# Patient Record
Sex: Female | Born: 1935 | Race: White | Hispanic: No | Smoking: Never smoker
Health system: Southern US, Community
[De-identification: ages and names within clinical notes are randomized; demographics above are authoritative.]

## PROBLEM LIST (undated history)

## (undated) DIAGNOSIS — E785 Hyperlipidemia, unspecified: Secondary | ICD-10-CM

## (undated) DIAGNOSIS — Z951 Presence of aortocoronary bypass graft: Secondary | ICD-10-CM

## (undated) DIAGNOSIS — E039 Hypothyroidism, unspecified: Secondary | ICD-10-CM

## (undated) DIAGNOSIS — N8110 Cystocele, unspecified: Secondary | ICD-10-CM

## (undated) DIAGNOSIS — I447 Left bundle-branch block, unspecified: Secondary | ICD-10-CM

## (undated) DIAGNOSIS — E079 Disorder of thyroid, unspecified: Secondary | ICD-10-CM

## (undated) DIAGNOSIS — I2510 Atherosclerotic heart disease of native coronary artery without angina pectoris: Secondary | ICD-10-CM

## (undated) DIAGNOSIS — J302 Other seasonal allergic rhinitis: Secondary | ICD-10-CM

## (undated) DIAGNOSIS — J4540 Moderate persistent asthma, uncomplicated: Secondary | ICD-10-CM

## (undated) DIAGNOSIS — C801 Malignant (primary) neoplasm, unspecified: Secondary | ICD-10-CM

## (undated) DIAGNOSIS — K219 Gastro-esophageal reflux disease without esophagitis: Secondary | ICD-10-CM

## (undated) DIAGNOSIS — I10 Essential (primary) hypertension: Secondary | ICD-10-CM

## (undated) DIAGNOSIS — M79671 Pain in right foot: Secondary | ICD-10-CM

## (undated) HISTORY — PX: THYROIDECTOMY: SHX17

## (undated) HISTORY — DX: Cystocele, unspecified: N81.10

## (undated) HISTORY — PX: JOINT REPLACEMENT: SHX530

## (undated) HISTORY — DX: Hyperlipidemia, unspecified: E78.5

## (undated) HISTORY — DX: Essential (primary) hypertension: I10

## (undated) HISTORY — DX: Other seasonal allergic rhinitis: J30.2

## (undated) HISTORY — DX: Pain in right foot: M79.671

## (undated) HISTORY — PX: ROTATOR CUFF REPAIR: SHX139

## (undated) HISTORY — PX: VAGINAL PROLAPSE REPAIR: SHX830

## (undated) HISTORY — DX: Left bundle-branch block, unspecified: I44.7

## (undated) HISTORY — DX: Moderate persistent asthma, uncomplicated: J45.40

## (undated) HISTORY — DX: Presence of aortocoronary bypass graft: Z95.1

## (undated) HISTORY — PX: KNEE ARTHROSCOPY: SUR90

## (undated) HISTORY — PX: THYROID SURGERY: SHX805

## (undated) HISTORY — PX: OTHER SURGICAL HISTORY: SHX169

## (undated) HISTORY — PX: APPENDECTOMY: SHX54

## (undated) HISTORY — DX: Atherosclerotic heart disease of native coronary artery without angina pectoris: I25.10

## (undated) HISTORY — DX: Disorder of thyroid, unspecified: E07.9

---

## 1997-02-24 HISTORY — PX: EYE SURGERY: SHX253

## 1997-06-07 ENCOUNTER — Other Ambulatory Visit: Admission: RE | Admit: 1997-06-07 | Discharge: 1997-06-07 | Payer: Self-pay | Admitting: Gynecology

## 1997-07-10 ENCOUNTER — Ambulatory Visit (HOSPITAL_COMMUNITY): Admission: RE | Admit: 1997-07-10 | Discharge: 1997-07-10 | Payer: Self-pay | Admitting: Gynecology

## 1998-06-12 ENCOUNTER — Other Ambulatory Visit: Admission: RE | Admit: 1998-06-12 | Discharge: 1998-06-12 | Payer: Self-pay | Admitting: Gynecology

## 1999-04-24 ENCOUNTER — Encounter: Payer: Self-pay | Admitting: Urology

## 1999-04-26 ENCOUNTER — Observation Stay (HOSPITAL_COMMUNITY): Admission: RE | Admit: 1999-04-26 | Discharge: 1999-04-27 | Payer: Self-pay | Admitting: Urology

## 1999-08-21 ENCOUNTER — Ambulatory Visit (HOSPITAL_COMMUNITY): Admission: RE | Admit: 1999-08-21 | Discharge: 1999-08-21 | Payer: Self-pay | Admitting: Specialist

## 1999-09-16 ENCOUNTER — Other Ambulatory Visit: Admission: RE | Admit: 1999-09-16 | Discharge: 1999-09-16 | Payer: Self-pay | Admitting: Gynecology

## 1999-09-26 ENCOUNTER — Ambulatory Visit (HOSPITAL_COMMUNITY): Admission: RE | Admit: 1999-09-26 | Discharge: 1999-09-26 | Payer: Self-pay | Admitting: Urology

## 1999-10-16 ENCOUNTER — Ambulatory Visit (HOSPITAL_COMMUNITY): Admission: RE | Admit: 1999-10-16 | Discharge: 1999-10-16 | Payer: Self-pay | Admitting: Specialist

## 2000-01-03 ENCOUNTER — Ambulatory Visit (HOSPITAL_COMMUNITY): Admission: RE | Admit: 2000-01-03 | Discharge: 2000-01-03 | Payer: Self-pay | Admitting: Urology

## 2000-09-28 ENCOUNTER — Other Ambulatory Visit: Admission: RE | Admit: 2000-09-28 | Discharge: 2000-09-28 | Payer: Self-pay | Admitting: Gynecology

## 2001-10-07 ENCOUNTER — Other Ambulatory Visit: Admission: RE | Admit: 2001-10-07 | Discharge: 2001-10-07 | Payer: Self-pay | Admitting: Obstetrics & Gynecology

## 2002-01-31 ENCOUNTER — Encounter: Admission: RE | Admit: 2002-01-31 | Discharge: 2002-01-31 | Payer: Self-pay | Admitting: Unknown Physician Specialty

## 2002-01-31 ENCOUNTER — Encounter: Payer: Self-pay | Admitting: Unknown Physician Specialty

## 2002-10-13 ENCOUNTER — Other Ambulatory Visit: Admission: RE | Admit: 2002-10-13 | Discharge: 2002-10-13 | Payer: Self-pay | Admitting: Obstetrics & Gynecology

## 2003-06-19 ENCOUNTER — Ambulatory Visit (HOSPITAL_COMMUNITY): Admission: RE | Admit: 2003-06-19 | Discharge: 2003-06-19 | Payer: Self-pay | Admitting: Obstetrics & Gynecology

## 2004-10-21 ENCOUNTER — Other Ambulatory Visit: Admission: RE | Admit: 2004-10-21 | Discharge: 2004-10-21 | Payer: Self-pay | Admitting: Obstetrics & Gynecology

## 2007-02-25 HISTORY — PX: OTHER SURGICAL HISTORY: SHX169

## 2011-02-28 DIAGNOSIS — M47817 Spondylosis without myelopathy or radiculopathy, lumbosacral region: Secondary | ICD-10-CM | POA: Diagnosis not present

## 2011-02-28 DIAGNOSIS — M5137 Other intervertebral disc degeneration, lumbosacral region: Secondary | ICD-10-CM | POA: Diagnosis not present

## 2011-03-31 DIAGNOSIS — Z124 Encounter for screening for malignant neoplasm of cervix: Secondary | ICD-10-CM | POA: Diagnosis not present

## 2011-03-31 DIAGNOSIS — Z1231 Encounter for screening mammogram for malignant neoplasm of breast: Secondary | ICD-10-CM | POA: Diagnosis not present

## 2011-04-02 DIAGNOSIS — K5730 Diverticulosis of large intestine without perforation or abscess without bleeding: Secondary | ICD-10-CM | POA: Diagnosis not present

## 2011-04-22 DIAGNOSIS — Z860100 Personal history of colon polyps, unspecified: Secondary | ICD-10-CM | POA: Diagnosis not present

## 2011-04-22 DIAGNOSIS — M129 Arthropathy, unspecified: Secondary | ICD-10-CM | POA: Diagnosis not present

## 2011-04-22 DIAGNOSIS — E7800 Pure hypercholesterolemia, unspecified: Secondary | ICD-10-CM | POA: Diagnosis not present

## 2011-04-22 DIAGNOSIS — Z79899 Other long term (current) drug therapy: Secondary | ICD-10-CM | POA: Diagnosis not present

## 2011-04-22 DIAGNOSIS — K219 Gastro-esophageal reflux disease without esophagitis: Secondary | ICD-10-CM | POA: Diagnosis not present

## 2011-04-22 DIAGNOSIS — K5730 Diverticulosis of large intestine without perforation or abscess without bleeding: Secondary | ICD-10-CM | POA: Diagnosis not present

## 2011-04-22 DIAGNOSIS — I10 Essential (primary) hypertension: Secondary | ICD-10-CM | POA: Diagnosis not present

## 2011-04-22 DIAGNOSIS — E079 Disorder of thyroid, unspecified: Secondary | ICD-10-CM | POA: Diagnosis not present

## 2011-04-22 DIAGNOSIS — Z7982 Long term (current) use of aspirin: Secondary | ICD-10-CM | POA: Diagnosis not present

## 2011-04-22 DIAGNOSIS — Z1211 Encounter for screening for malignant neoplasm of colon: Secondary | ICD-10-CM | POA: Diagnosis not present

## 2011-05-19 DIAGNOSIS — Z6827 Body mass index (BMI) 27.0-27.9, adult: Secondary | ICD-10-CM | POA: Diagnosis not present

## 2011-05-19 DIAGNOSIS — E785 Hyperlipidemia, unspecified: Secondary | ICD-10-CM | POA: Diagnosis not present

## 2011-05-19 DIAGNOSIS — D509 Iron deficiency anemia, unspecified: Secondary | ICD-10-CM | POA: Diagnosis not present

## 2011-05-19 DIAGNOSIS — Z79899 Other long term (current) drug therapy: Secondary | ICD-10-CM | POA: Diagnosis not present

## 2011-05-19 DIAGNOSIS — E039 Hypothyroidism, unspecified: Secondary | ICD-10-CM | POA: Diagnosis not present

## 2011-05-19 DIAGNOSIS — I10 Essential (primary) hypertension: Secondary | ICD-10-CM | POA: Diagnosis not present

## 2011-06-13 DIAGNOSIS — M5126 Other intervertebral disc displacement, lumbar region: Secondary | ICD-10-CM | POA: Diagnosis not present

## 2011-06-13 DIAGNOSIS — M5137 Other intervertebral disc degeneration, lumbosacral region: Secondary | ICD-10-CM | POA: Diagnosis not present

## 2011-06-13 DIAGNOSIS — Z6827 Body mass index (BMI) 27.0-27.9, adult: Secondary | ICD-10-CM | POA: Diagnosis not present

## 2011-06-13 DIAGNOSIS — I10 Essential (primary) hypertension: Secondary | ICD-10-CM | POA: Diagnosis not present

## 2011-06-13 DIAGNOSIS — M47817 Spondylosis without myelopathy or radiculopathy, lumbosacral region: Secondary | ICD-10-CM | POA: Diagnosis not present

## 2011-07-09 DIAGNOSIS — L301 Dyshidrosis [pompholyx]: Secondary | ICD-10-CM | POA: Diagnosis not present

## 2011-07-09 DIAGNOSIS — L981 Factitial dermatitis: Secondary | ICD-10-CM | POA: Diagnosis not present

## 2011-08-17 DIAGNOSIS — N3000 Acute cystitis without hematuria: Secondary | ICD-10-CM | POA: Diagnosis not present

## 2011-08-17 DIAGNOSIS — N390 Urinary tract infection, site not specified: Secondary | ICD-10-CM | POA: Diagnosis not present

## 2011-09-04 DIAGNOSIS — R252 Cramp and spasm: Secondary | ICD-10-CM | POA: Diagnosis not present

## 2011-09-04 DIAGNOSIS — E039 Hypothyroidism, unspecified: Secondary | ICD-10-CM | POA: Diagnosis not present

## 2011-09-04 DIAGNOSIS — Z6827 Body mass index (BMI) 27.0-27.9, adult: Secondary | ICD-10-CM | POA: Diagnosis not present

## 2011-09-04 DIAGNOSIS — E785 Hyperlipidemia, unspecified: Secondary | ICD-10-CM | POA: Diagnosis not present

## 2011-09-04 DIAGNOSIS — I10 Essential (primary) hypertension: Secondary | ICD-10-CM | POA: Diagnosis not present

## 2011-09-11 DIAGNOSIS — E871 Hypo-osmolality and hyponatremia: Secondary | ICD-10-CM | POA: Diagnosis not present

## 2011-09-18 DIAGNOSIS — H04129 Dry eye syndrome of unspecified lacrimal gland: Secondary | ICD-10-CM | POA: Diagnosis not present

## 2011-10-23 DIAGNOSIS — B079 Viral wart, unspecified: Secondary | ICD-10-CM | POA: Diagnosis not present

## 2011-12-22 DIAGNOSIS — E039 Hypothyroidism, unspecified: Secondary | ICD-10-CM | POA: Diagnosis not present

## 2011-12-22 DIAGNOSIS — R059 Cough, unspecified: Secondary | ICD-10-CM | POA: Diagnosis not present

## 2011-12-22 DIAGNOSIS — E785 Hyperlipidemia, unspecified: Secondary | ICD-10-CM | POA: Diagnosis not present

## 2011-12-22 DIAGNOSIS — Z23 Encounter for immunization: Secondary | ICD-10-CM | POA: Diagnosis not present

## 2011-12-22 DIAGNOSIS — E871 Hypo-osmolality and hyponatremia: Secondary | ICD-10-CM | POA: Diagnosis not present

## 2011-12-22 DIAGNOSIS — I10 Essential (primary) hypertension: Secondary | ICD-10-CM | POA: Diagnosis not present

## 2011-12-22 DIAGNOSIS — R05 Cough: Secondary | ICD-10-CM | POA: Diagnosis not present

## 2011-12-24 DIAGNOSIS — E871 Hypo-osmolality and hyponatremia: Secondary | ICD-10-CM | POA: Diagnosis not present

## 2011-12-31 DIAGNOSIS — N951 Menopausal and female climacteric states: Secondary | ICD-10-CM | POA: Diagnosis not present

## 2011-12-31 DIAGNOSIS — M818 Other osteoporosis without current pathological fracture: Secondary | ICD-10-CM | POA: Diagnosis not present

## 2012-01-15 DIAGNOSIS — M19079 Primary osteoarthritis, unspecified ankle and foot: Secondary | ICD-10-CM | POA: Diagnosis not present

## 2012-01-15 DIAGNOSIS — M779 Enthesopathy, unspecified: Secondary | ICD-10-CM | POA: Diagnosis not present

## 2012-01-15 DIAGNOSIS — M79609 Pain in unspecified limb: Secondary | ICD-10-CM | POA: Diagnosis not present

## 2012-04-05 DIAGNOSIS — N951 Menopausal and female climacteric states: Secondary | ICD-10-CM | POA: Diagnosis not present

## 2012-04-05 DIAGNOSIS — Z1231 Encounter for screening mammogram for malignant neoplasm of breast: Secondary | ICD-10-CM | POA: Diagnosis not present

## 2012-04-15 DIAGNOSIS — E785 Hyperlipidemia, unspecified: Secondary | ICD-10-CM | POA: Diagnosis not present

## 2012-04-15 DIAGNOSIS — E039 Hypothyroidism, unspecified: Secondary | ICD-10-CM | POA: Diagnosis not present

## 2012-04-15 DIAGNOSIS — I10 Essential (primary) hypertension: Secondary | ICD-10-CM | POA: Diagnosis not present

## 2012-04-15 DIAGNOSIS — Z6826 Body mass index (BMI) 26.0-26.9, adult: Secondary | ICD-10-CM | POA: Diagnosis not present

## 2012-04-16 DIAGNOSIS — M25519 Pain in unspecified shoulder: Secondary | ICD-10-CM | POA: Diagnosis not present

## 2012-04-19 DIAGNOSIS — M25519 Pain in unspecified shoulder: Secondary | ICD-10-CM | POA: Diagnosis not present

## 2012-04-21 DIAGNOSIS — M25519 Pain in unspecified shoulder: Secondary | ICD-10-CM | POA: Diagnosis not present

## 2012-04-23 DIAGNOSIS — M25519 Pain in unspecified shoulder: Secondary | ICD-10-CM | POA: Diagnosis not present

## 2012-05-03 DIAGNOSIS — M25519 Pain in unspecified shoulder: Secondary | ICD-10-CM | POA: Diagnosis not present

## 2012-05-05 DIAGNOSIS — M25519 Pain in unspecified shoulder: Secondary | ICD-10-CM | POA: Diagnosis not present

## 2012-05-07 DIAGNOSIS — M25519 Pain in unspecified shoulder: Secondary | ICD-10-CM | POA: Diagnosis not present

## 2012-05-10 DIAGNOSIS — M25519 Pain in unspecified shoulder: Secondary | ICD-10-CM | POA: Diagnosis not present

## 2012-05-12 DIAGNOSIS — M25519 Pain in unspecified shoulder: Secondary | ICD-10-CM | POA: Diagnosis not present

## 2012-05-13 DIAGNOSIS — J209 Acute bronchitis, unspecified: Secondary | ICD-10-CM | POA: Diagnosis not present

## 2012-05-13 DIAGNOSIS — J309 Allergic rhinitis, unspecified: Secondary | ICD-10-CM | POA: Diagnosis not present

## 2012-05-13 DIAGNOSIS — Z6826 Body mass index (BMI) 26.0-26.9, adult: Secondary | ICD-10-CM | POA: Diagnosis not present

## 2012-05-14 DIAGNOSIS — M25519 Pain in unspecified shoulder: Secondary | ICD-10-CM | POA: Diagnosis not present

## 2012-05-17 DIAGNOSIS — M25519 Pain in unspecified shoulder: Secondary | ICD-10-CM | POA: Diagnosis not present

## 2012-05-19 DIAGNOSIS — M25519 Pain in unspecified shoulder: Secondary | ICD-10-CM | POA: Diagnosis not present

## 2012-05-21 DIAGNOSIS — M25519 Pain in unspecified shoulder: Secondary | ICD-10-CM | POA: Diagnosis not present

## 2012-05-25 DIAGNOSIS — M25519 Pain in unspecified shoulder: Secondary | ICD-10-CM | POA: Diagnosis not present

## 2012-06-01 DIAGNOSIS — M25519 Pain in unspecified shoulder: Secondary | ICD-10-CM | POA: Diagnosis not present

## 2012-06-17 DIAGNOSIS — J209 Acute bronchitis, unspecified: Secondary | ICD-10-CM | POA: Diagnosis not present

## 2012-06-17 DIAGNOSIS — Z6827 Body mass index (BMI) 27.0-27.9, adult: Secondary | ICD-10-CM | POA: Diagnosis not present

## 2012-06-21 DIAGNOSIS — L259 Unspecified contact dermatitis, unspecified cause: Secondary | ICD-10-CM | POA: Diagnosis not present

## 2012-06-21 DIAGNOSIS — L820 Inflamed seborrheic keratosis: Secondary | ICD-10-CM | POA: Diagnosis not present

## 2012-06-24 DIAGNOSIS — Z6826 Body mass index (BMI) 26.0-26.9, adult: Secondary | ICD-10-CM | POA: Diagnosis not present

## 2012-06-24 DIAGNOSIS — J40 Bronchitis, not specified as acute or chronic: Secondary | ICD-10-CM | POA: Diagnosis not present

## 2012-06-24 DIAGNOSIS — J209 Acute bronchitis, unspecified: Secondary | ICD-10-CM | POA: Diagnosis not present

## 2012-08-02 DIAGNOSIS — N390 Urinary tract infection, site not specified: Secondary | ICD-10-CM | POA: Diagnosis not present

## 2012-08-02 DIAGNOSIS — Z1331 Encounter for screening for depression: Secondary | ICD-10-CM | POA: Diagnosis not present

## 2012-08-02 DIAGNOSIS — I10 Essential (primary) hypertension: Secondary | ICD-10-CM | POA: Diagnosis not present

## 2012-08-02 DIAGNOSIS — E039 Hypothyroidism, unspecified: Secondary | ICD-10-CM | POA: Diagnosis not present

## 2012-08-02 DIAGNOSIS — E871 Hypo-osmolality and hyponatremia: Secondary | ICD-10-CM | POA: Diagnosis not present

## 2012-08-02 DIAGNOSIS — E785 Hyperlipidemia, unspecified: Secondary | ICD-10-CM | POA: Diagnosis not present

## 2012-08-02 DIAGNOSIS — Z9181 History of falling: Secondary | ICD-10-CM | POA: Diagnosis not present

## 2012-09-28 DIAGNOSIS — R29818 Other symptoms and signs involving the nervous system: Secondary | ICD-10-CM | POA: Diagnosis not present

## 2012-09-28 DIAGNOSIS — R05 Cough: Secondary | ICD-10-CM | POA: Diagnosis not present

## 2012-09-28 DIAGNOSIS — K5732 Diverticulitis of large intestine without perforation or abscess without bleeding: Secondary | ICD-10-CM | POA: Diagnosis not present

## 2012-09-28 DIAGNOSIS — Z6827 Body mass index (BMI) 27.0-27.9, adult: Secondary | ICD-10-CM | POA: Diagnosis not present

## 2012-09-30 DIAGNOSIS — IMO0001 Reserved for inherently not codable concepts without codable children: Secondary | ICD-10-CM | POA: Diagnosis not present

## 2012-09-30 DIAGNOSIS — R279 Unspecified lack of coordination: Secondary | ICD-10-CM | POA: Diagnosis not present

## 2012-09-30 DIAGNOSIS — R29818 Other symptoms and signs involving the nervous system: Secondary | ICD-10-CM | POA: Diagnosis not present

## 2012-09-30 DIAGNOSIS — M6281 Muscle weakness (generalized): Secondary | ICD-10-CM | POA: Diagnosis not present

## 2012-09-30 DIAGNOSIS — R269 Unspecified abnormalities of gait and mobility: Secondary | ICD-10-CM | POA: Diagnosis not present

## 2012-09-30 DIAGNOSIS — Z9181 History of falling: Secondary | ICD-10-CM | POA: Diagnosis not present

## 2012-09-30 DIAGNOSIS — M79609 Pain in unspecified limb: Secondary | ICD-10-CM | POA: Diagnosis not present

## 2012-10-04 DIAGNOSIS — I6529 Occlusion and stenosis of unspecified carotid artery: Secondary | ICD-10-CM | POA: Diagnosis not present

## 2012-10-05 DIAGNOSIS — M79609 Pain in unspecified limb: Secondary | ICD-10-CM | POA: Diagnosis not present

## 2012-10-05 DIAGNOSIS — R29818 Other symptoms and signs involving the nervous system: Secondary | ICD-10-CM | POA: Diagnosis not present

## 2012-10-05 DIAGNOSIS — IMO0001 Reserved for inherently not codable concepts without codable children: Secondary | ICD-10-CM | POA: Diagnosis not present

## 2012-10-05 DIAGNOSIS — R279 Unspecified lack of coordination: Secondary | ICD-10-CM | POA: Diagnosis not present

## 2012-10-05 DIAGNOSIS — R269 Unspecified abnormalities of gait and mobility: Secondary | ICD-10-CM | POA: Diagnosis not present

## 2012-10-05 DIAGNOSIS — M6281 Muscle weakness (generalized): Secondary | ICD-10-CM | POA: Diagnosis not present

## 2012-10-07 DIAGNOSIS — R29818 Other symptoms and signs involving the nervous system: Secondary | ICD-10-CM | POA: Diagnosis not present

## 2012-10-07 DIAGNOSIS — M79609 Pain in unspecified limb: Secondary | ICD-10-CM | POA: Diagnosis not present

## 2012-10-07 DIAGNOSIS — IMO0001 Reserved for inherently not codable concepts without codable children: Secondary | ICD-10-CM | POA: Diagnosis not present

## 2012-10-07 DIAGNOSIS — M6281 Muscle weakness (generalized): Secondary | ICD-10-CM | POA: Diagnosis not present

## 2012-10-07 DIAGNOSIS — R269 Unspecified abnormalities of gait and mobility: Secondary | ICD-10-CM | POA: Diagnosis not present

## 2012-10-07 DIAGNOSIS — R279 Unspecified lack of coordination: Secondary | ICD-10-CM | POA: Diagnosis not present

## 2012-10-11 DIAGNOSIS — R29818 Other symptoms and signs involving the nervous system: Secondary | ICD-10-CM | POA: Diagnosis not present

## 2012-10-11 DIAGNOSIS — R269 Unspecified abnormalities of gait and mobility: Secondary | ICD-10-CM | POA: Diagnosis not present

## 2012-10-11 DIAGNOSIS — R279 Unspecified lack of coordination: Secondary | ICD-10-CM | POA: Diagnosis not present

## 2012-10-11 DIAGNOSIS — M6281 Muscle weakness (generalized): Secondary | ICD-10-CM | POA: Diagnosis not present

## 2012-10-11 DIAGNOSIS — IMO0001 Reserved for inherently not codable concepts without codable children: Secondary | ICD-10-CM | POA: Diagnosis not present

## 2012-10-11 DIAGNOSIS — Z9181 History of falling: Secondary | ICD-10-CM | POA: Diagnosis not present

## 2012-10-11 DIAGNOSIS — M79609 Pain in unspecified limb: Secondary | ICD-10-CM | POA: Diagnosis not present

## 2012-10-13 DIAGNOSIS — R269 Unspecified abnormalities of gait and mobility: Secondary | ICD-10-CM | POA: Diagnosis not present

## 2012-10-13 DIAGNOSIS — R29818 Other symptoms and signs involving the nervous system: Secondary | ICD-10-CM | POA: Diagnosis not present

## 2012-10-13 DIAGNOSIS — R279 Unspecified lack of coordination: Secondary | ICD-10-CM | POA: Diagnosis not present

## 2012-10-13 DIAGNOSIS — IMO0001 Reserved for inherently not codable concepts without codable children: Secondary | ICD-10-CM | POA: Diagnosis not present

## 2012-10-13 DIAGNOSIS — M79609 Pain in unspecified limb: Secondary | ICD-10-CM | POA: Diagnosis not present

## 2012-10-13 DIAGNOSIS — M6281 Muscle weakness (generalized): Secondary | ICD-10-CM | POA: Diagnosis not present

## 2012-10-20 DIAGNOSIS — R269 Unspecified abnormalities of gait and mobility: Secondary | ICD-10-CM | POA: Diagnosis not present

## 2012-10-20 DIAGNOSIS — M6281 Muscle weakness (generalized): Secondary | ICD-10-CM | POA: Diagnosis not present

## 2012-10-20 DIAGNOSIS — R279 Unspecified lack of coordination: Secondary | ICD-10-CM | POA: Diagnosis not present

## 2012-10-20 DIAGNOSIS — IMO0001 Reserved for inherently not codable concepts without codable children: Secondary | ICD-10-CM | POA: Diagnosis not present

## 2012-10-20 DIAGNOSIS — R29818 Other symptoms and signs involving the nervous system: Secondary | ICD-10-CM | POA: Diagnosis not present

## 2012-10-20 DIAGNOSIS — M79609 Pain in unspecified limb: Secondary | ICD-10-CM | POA: Diagnosis not present

## 2012-10-21 ENCOUNTER — Institutional Professional Consult (permissible substitution): Payer: Self-pay | Admitting: Emergency Medicine

## 2012-10-22 DIAGNOSIS — R29818 Other symptoms and signs involving the nervous system: Secondary | ICD-10-CM | POA: Diagnosis not present

## 2012-10-22 DIAGNOSIS — R279 Unspecified lack of coordination: Secondary | ICD-10-CM | POA: Diagnosis not present

## 2012-10-22 DIAGNOSIS — IMO0001 Reserved for inherently not codable concepts without codable children: Secondary | ICD-10-CM | POA: Diagnosis not present

## 2012-10-22 DIAGNOSIS — M79609 Pain in unspecified limb: Secondary | ICD-10-CM | POA: Diagnosis not present

## 2012-10-22 DIAGNOSIS — M6281 Muscle weakness (generalized): Secondary | ICD-10-CM | POA: Diagnosis not present

## 2012-10-22 DIAGNOSIS — R269 Unspecified abnormalities of gait and mobility: Secondary | ICD-10-CM | POA: Diagnosis not present

## 2012-10-27 DIAGNOSIS — M6281 Muscle weakness (generalized): Secondary | ICD-10-CM | POA: Diagnosis not present

## 2012-10-27 DIAGNOSIS — IMO0001 Reserved for inherently not codable concepts without codable children: Secondary | ICD-10-CM | POA: Diagnosis not present

## 2012-10-27 DIAGNOSIS — R279 Unspecified lack of coordination: Secondary | ICD-10-CM | POA: Diagnosis not present

## 2012-10-27 DIAGNOSIS — M79609 Pain in unspecified limb: Secondary | ICD-10-CM | POA: Diagnosis not present

## 2012-10-27 DIAGNOSIS — Z9181 History of falling: Secondary | ICD-10-CM | POA: Diagnosis not present

## 2012-10-27 DIAGNOSIS — R269 Unspecified abnormalities of gait and mobility: Secondary | ICD-10-CM | POA: Diagnosis not present

## 2012-10-27 DIAGNOSIS — R29818 Other symptoms and signs involving the nervous system: Secondary | ICD-10-CM | POA: Diagnosis not present

## 2012-10-29 DIAGNOSIS — M6281 Muscle weakness (generalized): Secondary | ICD-10-CM | POA: Diagnosis not present

## 2012-10-29 DIAGNOSIS — R269 Unspecified abnormalities of gait and mobility: Secondary | ICD-10-CM | POA: Diagnosis not present

## 2012-10-29 DIAGNOSIS — IMO0001 Reserved for inherently not codable concepts without codable children: Secondary | ICD-10-CM | POA: Diagnosis not present

## 2012-10-29 DIAGNOSIS — R29818 Other symptoms and signs involving the nervous system: Secondary | ICD-10-CM | POA: Diagnosis not present

## 2012-10-29 DIAGNOSIS — R279 Unspecified lack of coordination: Secondary | ICD-10-CM | POA: Diagnosis not present

## 2012-10-29 DIAGNOSIS — M79609 Pain in unspecified limb: Secondary | ICD-10-CM | POA: Diagnosis not present

## 2012-11-03 DIAGNOSIS — M79609 Pain in unspecified limb: Secondary | ICD-10-CM | POA: Diagnosis not present

## 2012-11-03 DIAGNOSIS — R279 Unspecified lack of coordination: Secondary | ICD-10-CM | POA: Diagnosis not present

## 2012-11-03 DIAGNOSIS — R29818 Other symptoms and signs involving the nervous system: Secondary | ICD-10-CM | POA: Diagnosis not present

## 2012-11-03 DIAGNOSIS — M6281 Muscle weakness (generalized): Secondary | ICD-10-CM | POA: Diagnosis not present

## 2012-11-03 DIAGNOSIS — IMO0001 Reserved for inherently not codable concepts without codable children: Secondary | ICD-10-CM | POA: Diagnosis not present

## 2012-11-03 DIAGNOSIS — R269 Unspecified abnormalities of gait and mobility: Secondary | ICD-10-CM | POA: Diagnosis not present

## 2012-11-05 DIAGNOSIS — M79609 Pain in unspecified limb: Secondary | ICD-10-CM | POA: Diagnosis not present

## 2012-11-05 DIAGNOSIS — R269 Unspecified abnormalities of gait and mobility: Secondary | ICD-10-CM | POA: Diagnosis not present

## 2012-11-05 DIAGNOSIS — IMO0001 Reserved for inherently not codable concepts without codable children: Secondary | ICD-10-CM | POA: Diagnosis not present

## 2012-11-05 DIAGNOSIS — M6281 Muscle weakness (generalized): Secondary | ICD-10-CM | POA: Diagnosis not present

## 2012-11-05 DIAGNOSIS — R29818 Other symptoms and signs involving the nervous system: Secondary | ICD-10-CM | POA: Diagnosis not present

## 2012-11-05 DIAGNOSIS — R279 Unspecified lack of coordination: Secondary | ICD-10-CM | POA: Diagnosis not present

## 2012-11-09 DIAGNOSIS — E785 Hyperlipidemia, unspecified: Secondary | ICD-10-CM | POA: Diagnosis not present

## 2012-11-09 DIAGNOSIS — I10 Essential (primary) hypertension: Secondary | ICD-10-CM | POA: Diagnosis not present

## 2012-11-09 DIAGNOSIS — E039 Hypothyroidism, unspecified: Secondary | ICD-10-CM | POA: Diagnosis not present

## 2012-11-09 DIAGNOSIS — Z6826 Body mass index (BMI) 26.0-26.9, adult: Secondary | ICD-10-CM | POA: Diagnosis not present

## 2012-11-09 DIAGNOSIS — Z23 Encounter for immunization: Secondary | ICD-10-CM | POA: Diagnosis not present

## 2012-11-10 DIAGNOSIS — R279 Unspecified lack of coordination: Secondary | ICD-10-CM | POA: Diagnosis not present

## 2012-11-10 DIAGNOSIS — R269 Unspecified abnormalities of gait and mobility: Secondary | ICD-10-CM | POA: Diagnosis not present

## 2012-11-10 DIAGNOSIS — M79609 Pain in unspecified limb: Secondary | ICD-10-CM | POA: Diagnosis not present

## 2012-11-10 DIAGNOSIS — R29818 Other symptoms and signs involving the nervous system: Secondary | ICD-10-CM | POA: Diagnosis not present

## 2012-11-10 DIAGNOSIS — M6281 Muscle weakness (generalized): Secondary | ICD-10-CM | POA: Diagnosis not present

## 2012-11-10 DIAGNOSIS — IMO0001 Reserved for inherently not codable concepts without codable children: Secondary | ICD-10-CM | POA: Diagnosis not present

## 2012-11-16 DIAGNOSIS — M79609 Pain in unspecified limb: Secondary | ICD-10-CM | POA: Diagnosis not present

## 2012-11-16 DIAGNOSIS — R269 Unspecified abnormalities of gait and mobility: Secondary | ICD-10-CM | POA: Diagnosis not present

## 2012-11-16 DIAGNOSIS — R279 Unspecified lack of coordination: Secondary | ICD-10-CM | POA: Diagnosis not present

## 2012-11-16 DIAGNOSIS — Z6827 Body mass index (BMI) 27.0-27.9, adult: Secondary | ICD-10-CM | POA: Diagnosis not present

## 2012-11-16 DIAGNOSIS — IMO0001 Reserved for inherently not codable concepts without codable children: Secondary | ICD-10-CM | POA: Diagnosis not present

## 2012-11-16 DIAGNOSIS — L659 Nonscarring hair loss, unspecified: Secondary | ICD-10-CM | POA: Diagnosis not present

## 2012-11-16 DIAGNOSIS — R29818 Other symptoms and signs involving the nervous system: Secondary | ICD-10-CM | POA: Diagnosis not present

## 2012-11-16 DIAGNOSIS — E039 Hypothyroidism, unspecified: Secondary | ICD-10-CM | POA: Diagnosis not present

## 2012-11-16 DIAGNOSIS — M6281 Muscle weakness (generalized): Secondary | ICD-10-CM | POA: Diagnosis not present

## 2012-12-04 DIAGNOSIS — Z23 Encounter for immunization: Secondary | ICD-10-CM | POA: Diagnosis not present

## 2012-12-28 DIAGNOSIS — Z23 Encounter for immunization: Secondary | ICD-10-CM | POA: Diagnosis not present

## 2012-12-28 DIAGNOSIS — S61209A Unspecified open wound of unspecified finger without damage to nail, initial encounter: Secondary | ICD-10-CM | POA: Diagnosis not present

## 2013-02-14 DIAGNOSIS — E785 Hyperlipidemia, unspecified: Secondary | ICD-10-CM | POA: Diagnosis not present

## 2013-02-14 DIAGNOSIS — I10 Essential (primary) hypertension: Secondary | ICD-10-CM | POA: Diagnosis not present

## 2013-02-14 DIAGNOSIS — S4980XA Other specified injuries of shoulder and upper arm, unspecified arm, initial encounter: Secondary | ICD-10-CM | POA: Diagnosis not present

## 2013-02-14 DIAGNOSIS — M25519 Pain in unspecified shoulder: Secondary | ICD-10-CM | POA: Diagnosis not present

## 2013-02-14 DIAGNOSIS — E890 Postprocedural hypothyroidism: Secondary | ICD-10-CM | POA: Diagnosis not present

## 2013-02-15 DIAGNOSIS — M6281 Muscle weakness (generalized): Secondary | ICD-10-CM | POA: Diagnosis not present

## 2013-02-15 DIAGNOSIS — M25519 Pain in unspecified shoulder: Secondary | ICD-10-CM | POA: Diagnosis not present

## 2013-02-22 DIAGNOSIS — M6281 Muscle weakness (generalized): Secondary | ICD-10-CM | POA: Diagnosis not present

## 2013-02-22 DIAGNOSIS — M25519 Pain in unspecified shoulder: Secondary | ICD-10-CM | POA: Diagnosis not present

## 2013-02-23 DIAGNOSIS — M6281 Muscle weakness (generalized): Secondary | ICD-10-CM | POA: Diagnosis not present

## 2013-02-23 DIAGNOSIS — M25519 Pain in unspecified shoulder: Secondary | ICD-10-CM | POA: Diagnosis not present

## 2013-02-25 DIAGNOSIS — M25519 Pain in unspecified shoulder: Secondary | ICD-10-CM | POA: Diagnosis not present

## 2013-02-25 DIAGNOSIS — M6281 Muscle weakness (generalized): Secondary | ICD-10-CM | POA: Diagnosis not present

## 2013-02-28 DIAGNOSIS — M25519 Pain in unspecified shoulder: Secondary | ICD-10-CM | POA: Diagnosis not present

## 2013-02-28 DIAGNOSIS — M6281 Muscle weakness (generalized): Secondary | ICD-10-CM | POA: Diagnosis not present

## 2013-03-02 DIAGNOSIS — M25519 Pain in unspecified shoulder: Secondary | ICD-10-CM | POA: Diagnosis not present

## 2013-03-02 DIAGNOSIS — M6281 Muscle weakness (generalized): Secondary | ICD-10-CM | POA: Diagnosis not present

## 2013-03-04 DIAGNOSIS — M25519 Pain in unspecified shoulder: Secondary | ICD-10-CM | POA: Diagnosis not present

## 2013-03-04 DIAGNOSIS — M6281 Muscle weakness (generalized): Secondary | ICD-10-CM | POA: Diagnosis not present

## 2013-03-07 DIAGNOSIS — M25519 Pain in unspecified shoulder: Secondary | ICD-10-CM | POA: Diagnosis not present

## 2013-03-07 DIAGNOSIS — M6281 Muscle weakness (generalized): Secondary | ICD-10-CM | POA: Diagnosis not present

## 2013-03-11 DIAGNOSIS — J0100 Acute maxillary sinusitis, unspecified: Secondary | ICD-10-CM | POA: Diagnosis not present

## 2013-03-11 DIAGNOSIS — M25519 Pain in unspecified shoulder: Secondary | ICD-10-CM | POA: Diagnosis not present

## 2013-03-11 DIAGNOSIS — Z6826 Body mass index (BMI) 26.0-26.9, adult: Secondary | ICD-10-CM | POA: Diagnosis not present

## 2013-03-11 DIAGNOSIS — M6281 Muscle weakness (generalized): Secondary | ICD-10-CM | POA: Diagnosis not present

## 2013-03-11 DIAGNOSIS — J029 Acute pharyngitis, unspecified: Secondary | ICD-10-CM | POA: Diagnosis not present

## 2013-03-15 DIAGNOSIS — M25519 Pain in unspecified shoulder: Secondary | ICD-10-CM | POA: Diagnosis not present

## 2013-03-15 DIAGNOSIS — M6281 Muscle weakness (generalized): Secondary | ICD-10-CM | POA: Diagnosis not present

## 2013-03-16 DIAGNOSIS — M25519 Pain in unspecified shoulder: Secondary | ICD-10-CM | POA: Diagnosis not present

## 2013-03-16 DIAGNOSIS — M6281 Muscle weakness (generalized): Secondary | ICD-10-CM | POA: Diagnosis not present

## 2013-03-18 DIAGNOSIS — M6281 Muscle weakness (generalized): Secondary | ICD-10-CM | POA: Diagnosis not present

## 2013-03-18 DIAGNOSIS — M25519 Pain in unspecified shoulder: Secondary | ICD-10-CM | POA: Diagnosis not present

## 2013-03-21 DIAGNOSIS — M25519 Pain in unspecified shoulder: Secondary | ICD-10-CM | POA: Diagnosis not present

## 2013-03-21 DIAGNOSIS — M6281 Muscle weakness (generalized): Secondary | ICD-10-CM | POA: Diagnosis not present

## 2013-03-23 DIAGNOSIS — M25519 Pain in unspecified shoulder: Secondary | ICD-10-CM | POA: Diagnosis not present

## 2013-03-23 DIAGNOSIS — M6281 Muscle weakness (generalized): Secondary | ICD-10-CM | POA: Diagnosis not present

## 2013-04-04 DIAGNOSIS — J209 Acute bronchitis, unspecified: Secondary | ICD-10-CM | POA: Diagnosis not present

## 2013-04-04 DIAGNOSIS — Z6826 Body mass index (BMI) 26.0-26.9, adult: Secondary | ICD-10-CM | POA: Diagnosis not present

## 2013-04-11 DIAGNOSIS — Z124 Encounter for screening for malignant neoplasm of cervix: Secondary | ICD-10-CM | POA: Diagnosis not present

## 2013-04-11 DIAGNOSIS — Z1231 Encounter for screening mammogram for malignant neoplasm of breast: Secondary | ICD-10-CM | POA: Diagnosis not present

## 2013-05-19 ENCOUNTER — Ambulatory Visit (INDEPENDENT_AMBULATORY_CARE_PROVIDER_SITE_OTHER): Payer: Medicare Other

## 2013-05-19 VITALS — BP 158/84 | HR 71 | Resp 16 | Ht 63.0 in | Wt 142.0 lb

## 2013-05-19 DIAGNOSIS — L84 Corns and callosities: Secondary | ICD-10-CM

## 2013-05-19 DIAGNOSIS — M2040 Other hammer toe(s) (acquired), unspecified foot: Secondary | ICD-10-CM

## 2013-05-19 DIAGNOSIS — M2010 Hallux valgus (acquired), unspecified foot: Secondary | ICD-10-CM | POA: Diagnosis not present

## 2013-05-19 DIAGNOSIS — M79609 Pain in unspecified limb: Secondary | ICD-10-CM

## 2013-05-19 NOTE — Patient Instructions (Signed)

## 2013-05-19 NOTE — Progress Notes (Signed)
cr  Subjective:    Patient ID: Brittany Santiago, female    DOB: Jan 02, 1931, 80 y.o.   MRN: [id number]  HPI N corns        L R and L 2 nd medial toes        D and O years worse 2 to 3 weeks        C red and painful        A enclosed shoes        T gel pads                Review of Systems  Constitutional:       Difficulty sleeping  All other systems reviewed and are negative.       Objective:   Physical Exam Neurovascular status appears to be intact with pedal pulses palpable DP postal for PT plus one over 4 bilateral capillary refill time 3 seconds all digits skin temperature warm turgor normal no edema rubor pallor or varicosities noted neurologically epicritic and proprioceptive sensations intact and symmetric bilateral there is normal plantar response DTRs are listed dermatologically skin color pigment normal hair growth absent nails somewhat criptotic otherwise unremarkable there is keratoses medial surfaces second digits bilateral left more so than right. Patient is already digital contractures adductovarus rotation lesser digits 2 through 5 however the second left has a history of injury or trauma or fracture is rigidly contracted more so than the adjacent digits. Patient also has notable HAV deformity lateral deviation hallux bilateral this is confirmed clinically and radiographically with elevated I mean of 12-14 sesamoid position 6-7 and hallux abductus angle greater than 20 bilateral. Remainder of exam is otherwise unremarkable no open wounds ulcerations slight erythematous keratotic lesion second digits bilateral debridement at this time likely noncovered service 2 foam pads and silicone toe sleeves are dispensed for patient to use to keep the toes separated and provided temporary relief patient is also given literature about possible bunion hammertoe procedures that could be available the future since symptoms persist or recur.       Assessment & Plan:  Assessment hallux  abductovalgus deformity bilateral as well as hammertoe deformities bilateral associated keratoses second digits as result of the rigid deformities. The keratoses or debridement this time appropriate dispersion padding dispensed to the toes patient will determine with which one she prefers most literature about bunions and hammertoe surgery is dispensed for her to review. Reappointed future and as-needed basis for palliative care if there is recurrence or re\re exacerbation or possible discussion of surgical options in the future. Next progress Harriet Masson DPM

## 2013-05-20 DIAGNOSIS — I10 Essential (primary) hypertension: Secondary | ICD-10-CM | POA: Diagnosis not present

## 2013-05-20 DIAGNOSIS — E785 Hyperlipidemia, unspecified: Secondary | ICD-10-CM | POA: Diagnosis not present

## 2013-05-20 DIAGNOSIS — E890 Postprocedural hypothyroidism: Secondary | ICD-10-CM | POA: Diagnosis not present

## 2013-05-20 DIAGNOSIS — Z6827 Body mass index (BMI) 27.0-27.9, adult: Secondary | ICD-10-CM | POA: Diagnosis not present

## 2013-05-20 DIAGNOSIS — E039 Hypothyroidism, unspecified: Secondary | ICD-10-CM | POA: Diagnosis not present

## 2013-05-27 DIAGNOSIS — J209 Acute bronchitis, unspecified: Secondary | ICD-10-CM | POA: Diagnosis not present

## 2013-06-30 DIAGNOSIS — R05 Cough: Secondary | ICD-10-CM | POA: Diagnosis not present

## 2013-06-30 DIAGNOSIS — I10 Essential (primary) hypertension: Secondary | ICD-10-CM | POA: Diagnosis not present

## 2013-06-30 DIAGNOSIS — R059 Cough, unspecified: Secondary | ICD-10-CM | POA: Diagnosis not present

## 2013-06-30 DIAGNOSIS — Z6827 Body mass index (BMI) 27.0-27.9, adult: Secondary | ICD-10-CM | POA: Diagnosis not present

## 2013-07-12 DIAGNOSIS — J209 Acute bronchitis, unspecified: Secondary | ICD-10-CM | POA: Diagnosis not present

## 2013-07-12 DIAGNOSIS — I10 Essential (primary) hypertension: Secondary | ICD-10-CM | POA: Diagnosis not present

## 2013-07-12 DIAGNOSIS — Z6826 Body mass index (BMI) 26.0-26.9, adult: Secondary | ICD-10-CM | POA: Diagnosis not present

## 2013-07-21 DIAGNOSIS — R059 Cough, unspecified: Secondary | ICD-10-CM | POA: Diagnosis not present

## 2013-07-21 DIAGNOSIS — Z6826 Body mass index (BMI) 26.0-26.9, adult: Secondary | ICD-10-CM | POA: Diagnosis not present

## 2013-07-21 DIAGNOSIS — J40 Bronchitis, not specified as acute or chronic: Secondary | ICD-10-CM | POA: Diagnosis not present

## 2013-07-21 DIAGNOSIS — R05 Cough: Secondary | ICD-10-CM | POA: Diagnosis not present

## 2013-07-21 DIAGNOSIS — J209 Acute bronchitis, unspecified: Secondary | ICD-10-CM | POA: Diagnosis not present

## 2013-07-28 ENCOUNTER — Institutional Professional Consult (permissible substitution): Payer: Self-pay | Admitting: Pulmonary Disease

## 2013-08-02 ENCOUNTER — Encounter: Payer: Self-pay | Admitting: Critical Care Medicine

## 2013-08-02 ENCOUNTER — Ambulatory Visit (INDEPENDENT_AMBULATORY_CARE_PROVIDER_SITE_OTHER): Payer: Self-pay | Admitting: Critical Care Medicine

## 2013-08-02 VITALS — BP 130/72 | HR 78 | Temp 98.7°F | Ht 62.0 in | Wt 143.4 lb

## 2013-08-02 DIAGNOSIS — J302 Other seasonal allergic rhinitis: Secondary | ICD-10-CM

## 2013-08-02 DIAGNOSIS — I10 Essential (primary) hypertension: Secondary | ICD-10-CM

## 2013-08-02 DIAGNOSIS — R05 Cough: Secondary | ICD-10-CM | POA: Diagnosis not present

## 2013-08-02 DIAGNOSIS — R059 Cough, unspecified: Secondary | ICD-10-CM

## 2013-08-02 DIAGNOSIS — E785 Hyperlipidemia, unspecified: Secondary | ICD-10-CM

## 2013-08-02 DIAGNOSIS — J4540 Moderate persistent asthma, uncomplicated: Secondary | ICD-10-CM | POA: Insufficient documentation

## 2013-08-02 DIAGNOSIS — J45909 Unspecified asthma, uncomplicated: Secondary | ICD-10-CM | POA: Diagnosis not present

## 2013-08-02 DIAGNOSIS — J4541 Moderate persistent asthma with (acute) exacerbation: Secondary | ICD-10-CM

## 2013-08-02 DIAGNOSIS — E079 Disorder of thyroid, unspecified: Secondary | ICD-10-CM

## 2013-08-02 HISTORY — DX: Moderate persistent asthma, uncomplicated: J45.40

## 2013-08-02 MED ORDER — OMEGA-3 FISH OIL 1000 MG PO CAPS: ORAL_CAPSULE | ORAL | Status: DC

## 2013-08-02 MED ORDER — BECLOMETHASONE DIPROPIONATE 80 MCG/ACT IN AERS: 2.0000 | INHALATION_SPRAY | Freq: Two times a day (BID) | RESPIRATORY_TRACT | Status: DC

## 2013-08-02 MED ORDER — FLUTICASONE PROPIONATE 50 MCG/ACT NA SUSP: 2.0000 | Freq: Every day | NASAL | Status: DC

## 2013-08-02 NOTE — Patient Instructions (Signed)
Allergy profile Start Qvar 80 Two puff twice daily Use albuterol as needed Start fluticasone two puff ea nostril daily Stay on omeprazole Follow reflux diet Return 2 months

## 2013-08-02 NOTE — Assessment & Plan Note (Signed)
Moderate persistent asthma with flare plan Allergy profile Start Qvar 80 Two puff twice daily Use albuterol as needed Start fluticasone two puff ea nostril daily Stay on omeprazole Follow reflux diet Return 2 months

## 2013-08-02 NOTE — Progress Notes (Signed)
Subjective:    Patient ID: Brittany Santiago, female    DOB: 1930-08-28, 80 y.o.   MRN: [id number]  HPI Comments: Recurrent bronchitis since 03/2013, 4 episodes. Lifelong never smoker. Last spell 5/28 .    Cough This is a recurrent problem. The current episode started more than 1 month ago (starts as a sore throat then coughs). The problem has been waxing and waning. The problem occurs every few hours (when flares is constant to emesis). The cough is productive of purulent sputum (when flares has green yellow mucus). Associated symptoms include chills, headaches, heartburn, nasal congestion, postnasal drip, rhinorrhea, a sore throat and wheezing. Pertinent negatives include no chest pain, ear congestion, ear pain, fever, hemoptysis, myalgias, rash, shortness of breath, sweats or weight loss. Associated symptoms comments: On omeprazole, no breakthrough gerd on this . She has tried a beta-agonist inhaler and oral steroids (ABX ) for the symptoms. The treatment provided moderate relief. Her past medical history is significant for bronchitis and environmental allergies. There is no history of asthma, bronchiectasis, COPD, emphysema or pneumonia. allergy testing many years ago Pos , stopped immunotherapy, uses kenalog yearly    Past Medical History  Diagnosis Date  . Hyperlipidemia   . Hypertension   . Thyroid disease   . Seasonal allergies      Family History  Problem Relation Age of Onset  . Heart failure Mother   . Depression Father     commited suicide     History   Social History  . Marital Status: Married    Spouse Name: N/A    Number of Children: N/A  . Years of Education: N/A   Occupational History  . Not on file.   Social History Main Topics  . Smoking status: Never Smoker   . Smokeless tobacco: Never Used  . Alcohol Use: No  . Drug Use: No  . Sexual Activity: Not on file   Other Topics Concern  . Not on file   Social History Narrative   Lives alone,retired.   Husband deceased     No Known Allergies   Outpatient Prescriptions Prior to Visit  Medication Sig Dispense Refill  . aspirin 81 MG tablet Take 81 mg by mouth daily.      . finasteride (PROPECIA) 1 MG tablet Take 2.5 mg by mouth daily.      Marland Kitchen levothyroxine (SYNTHROID, LEVOTHROID) 75 MCG tablet Take 75 mcg by mouth daily before breakfast.      . Multiple Vitamins-Calcium (CALCI-MAX PO) Take by mouth.      . Multiple Vitamins-Minerals (MULTIVITAMIN WITH MINERALS) tablet Take 1 tablet by mouth daily.      Marland Kitchen omeprazole (PRILOSEC) 20 MG capsule Take 20 mg by mouth daily.      . simvastatin (ZOCOR) 40 MG tablet Take 40 mg by mouth daily.      . valsartan (DIOVAN) 160 MG tablet Take 160 mg by mouth daily.      . Vitamin D, Ergocalciferol, (DRISDOL) 50000 UNITS CAPS capsule Take 50,000 Units by mouth every 7 (seven) days.       No facility-administered medications prior to visit.      Review of Systems  Constitutional: Positive for chills. Negative for fever, weight loss and fatigue.  HENT: Positive for postnasal drip, rhinorrhea, sore throat and voice change. Negative for ear discharge, ear pain, nosebleeds, sinus pressure, sneezing and trouble swallowing.   Respiratory: Positive for cough and wheezing. Negative for hemoptysis, chest tightness and shortness of breath.  Cardiovascular: Negative for chest pain.  Gastrointestinal: Positive for heartburn.  Musculoskeletal: Negative for myalgias.  Skin: Negative for rash.  Allergic/Immunologic: Positive for environmental allergies.  Neurological: Positive for headaches.       Objective:   Physical Exam Filed Vitals:   08/02/13 1020  BP: 130/72  Pulse: 78  Temp: 98.7 F (37.1 C)  TempSrc: Oral  Height: 5\' 2"  (1.575 m)  Weight: 143 lb 6.4 oz (65.046 kg)  SpO2: 96%    Gen: Pleasant, well-nourished, in no distress,  normal affect  ENT: No lesions,  mouth clear,  oropharynx clear, ++ postnasal drip, moderate rhinitis  Neck: No  JVD, no TMG, no carotid bruits  Lungs: No use of accessory muscles, no dullness to percussion, distant BS, poor airfow.  Cardiovascular: RRR, heart sounds normal, no murmur or gallops, no peripheral edema  Abdomen: soft and NT, no HSM,  BS normal  Musculoskeletal: No deformities, no cyanosis or clubbing  Neuro: alert, non focal  Skin: Warm, no lesions or rashes  No results found.      Assessment & Plan:   Seasonal allergies Allergy profile Start fluticasone two puff ea nostril daily  Return 2 months   Moderate persistent asthma with acute exacerbation Moderate persistent asthma with flare plan Allergy profile Start Qvar 80 Two puff twice daily Use albuterol as needed Start fluticasone two puff ea nostril daily Stay on omeprazole Follow reflux diet Return 2 months    Updated Medication List Outpatient Encounter Prescriptions as of 08/02/2013  Medication Sig  . albuterol (PROVENTIL HFA;VENTOLIN HFA) 108 (90 BASE) MCG/ACT inhaler Inhale 2 puffs into the lungs every 6 (six) hours as needed for wheezing or shortness of breath.  Marland Kitchen amLODipine (NORVASC) 5 MG tablet Take 5 mg by mouth daily.  Marland Kitchen aspirin 81 MG tablet Take 81 mg by mouth daily.  . Cholecalciferol (VITAMIN D-3) 5000 UNITS TABS Take by mouth. Take 1 tablet daily by mouth  . finasteride (PROPECIA) 1 MG tablet Take 2.5 mg by mouth daily.  Marland Kitchen levothyroxine (SYNTHROID, LEVOTHROID) 75 MCG tablet Take 75 mcg by mouth daily before breakfast.  . Multiple Vitamins-Calcium (CALCI-MAX PO) Take by mouth.  . Multiple Vitamins-Minerals (MULTIVITAMIN WITH MINERALS) tablet Take 1 tablet by mouth daily.  Marland Kitchen omega-3 fish oil (MAXEPA) 1000 MG CAPS capsule HOLD FOR NOW  . omeprazole (PRILOSEC) 20 MG capsule Take 20 mg by mouth daily.  . simvastatin (ZOCOR) 40 MG tablet Take 40 mg by mouth daily.  . [DISCONTINUED] omega-3 fish oil (MAXEPA) 1000 MG CAPS capsule Take 1 capsule by mouth 2 (two) times daily.  . beclomethasone (QVAR) 80  MCG/ACT inhaler Inhale 2 puffs into the lungs 2 (two) times daily.  . fluticasone (FLONASE) 50 MCG/ACT nasal spray Place 2 sprays into both nostrils daily.  . [DISCONTINUED] valsartan (DIOVAN) 160 MG tablet Take 160 mg by mouth daily.  . [DISCONTINUED] Vitamin D, Ergocalciferol, (DRISDOL) 50000 UNITS CAPS capsule Take 50,000 Units by mouth every 7 (seven) days.

## 2013-08-02 NOTE — Assessment & Plan Note (Signed)
Allergy profile Start fluticasone two puff ea nostril daily  Return 2 months

## 2013-08-05 ENCOUNTER — Telehealth: Payer: Self-pay | Admitting: *Deleted

## 2013-08-05 NOTE — Telephone Encounter (Signed)
Pt saw PW on 08-02-13 in [place] and allergy test was ordered. Results received and reviewed. Per PW advise the pt that she is positive for multiple allergies. Pt aware. Copy placed in scan folder.Lead Bing, CMA

## 2013-08-24 DIAGNOSIS — M47817 Spondylosis without myelopathy or radiculopathy, lumbosacral region: Secondary | ICD-10-CM | POA: Diagnosis not present

## 2013-08-25 ENCOUNTER — Encounter: Payer: Self-pay | Admitting: Critical Care Medicine

## 2013-09-02 DIAGNOSIS — E785 Hyperlipidemia, unspecified: Secondary | ICD-10-CM | POA: Diagnosis not present

## 2013-09-02 DIAGNOSIS — E890 Postprocedural hypothyroidism: Secondary | ICD-10-CM | POA: Diagnosis not present

## 2013-09-02 DIAGNOSIS — Z9181 History of falling: Secondary | ICD-10-CM | POA: Diagnosis not present

## 2013-09-02 DIAGNOSIS — I10 Essential (primary) hypertension: Secondary | ICD-10-CM | POA: Diagnosis not present

## 2013-09-02 DIAGNOSIS — Z6826 Body mass index (BMI) 26.0-26.9, adult: Secondary | ICD-10-CM | POA: Diagnosis not present

## 2013-09-08 DIAGNOSIS — H40019 Open angle with borderline findings, low risk, unspecified eye: Secondary | ICD-10-CM | POA: Diagnosis not present

## 2013-10-04 ENCOUNTER — Encounter: Payer: Self-pay | Admitting: Critical Care Medicine

## 2013-10-04 ENCOUNTER — Ambulatory Visit (INDEPENDENT_AMBULATORY_CARE_PROVIDER_SITE_OTHER): Payer: Self-pay | Admitting: Critical Care Medicine

## 2013-10-04 VITALS — BP 142/64 | HR 87 | Temp 98.5°F | Ht 63.0 in | Wt 142.6 lb

## 2013-10-04 DIAGNOSIS — J45909 Unspecified asthma, uncomplicated: Secondary | ICD-10-CM | POA: Diagnosis not present

## 2013-10-04 DIAGNOSIS — J302 Other seasonal allergic rhinitis: Secondary | ICD-10-CM

## 2013-10-04 DIAGNOSIS — J4540 Moderate persistent asthma, uncomplicated: Secondary | ICD-10-CM

## 2013-10-04 DIAGNOSIS — J301 Allergic rhinitis due to pollen: Secondary | ICD-10-CM | POA: Diagnosis not present

## 2013-10-04 NOTE — Assessment & Plan Note (Signed)
Multiple positive environmental allergens Plan Pattern of avoidance Continue Flonase

## 2013-10-04 NOTE — Patient Instructions (Signed)
Stay on qvar and flonase Stay on prilosec Watch your reflux diet Ok to take one fish oil capsule daily, refrigerate Return 4 months

## 2013-10-04 NOTE — Assessment & Plan Note (Signed)
Moderate persistent asthma now improved with multiple atopic factors and reflux disease Plan Continue Qvar 2 puff twice daily Continue Flonase daily

## 2013-10-04 NOTE — Progress Notes (Signed)
Subjective:    Patient ID: Brittany Santiago, female    DOB: 08-01-1930, 80 y.o.   MRN: [id number]  HPI Comments: Recurrent bronchitis since 03/2013, 4 episodes. Lifelong never smoker. Last spell 5/28 .    10/04/2013 Chief Complaint  Patient presents with  . 2 month follow up    Cough has resolved.  No SOB, wheezing, or chest tightness/pain.  No cough now. No wheezing or issues currently Pt denies any significant sore throat, nasal congestion or excess secretions, fever, chills, sweats, unintended weight loss, pleurtic or exertional chest pain, orthopnea PND, or leg swelling Pt denies any increase in rescue therapy over baseline, denies waking up needing it or having any early am or nocturnal exacerbations of coughing/wheezing/or dyspnea. Pt also denies any obvious fluctuation in symptoms with  weather or environmental change or other alleviating or aggravating factors   PUL ASTHMA HISTORY 10/04/2013  Symptoms 0-2 days/week  Nighttime awakenings 0-2/month  Interference with activity No limitations  SABA use 0-2 days/wk  Exacerbations requiring oral steroids 0-1 / year     Review of Systems  Constitutional: Negative for fatigue.  HENT: Negative for ear discharge, nosebleeds, sinus pressure, sneezing, trouble swallowing and voice change.   Respiratory: Negative for chest tightness.        Objective:   Physical Exam  Filed Vitals:   10/04/13 1606  BP: 142/64  Pulse: 87  Temp: 98.5 F (36.9 C)  TempSrc: Oral  Height: 5\' 3"  (1.6 m)  Weight: 142 lb 9.6 oz (64.683 kg)  SpO2: 95%    Gen: Pleasant, well-nourished, in no distress,  normal affect  ENT: No lesions,  mouth clear,  oropharynx clear, + postnasal drip, improved rhinitis  Neck: No JVD, no TMG, no carotid bruits  Lungs: No use of accessory muscles, no dullness to percussion, clear with improved airflow.  Cardiovascular: RRR, heart sounds normal, no murmur or gallops, no peripheral edema  Abdomen: soft and NT, no  HSM,  BS normal  Musculoskeletal: No deformities, no cyanosis or clubbing  Neuro: alert, non focal  Skin: Warm, no lesions or rashes  No results found.      Assessment & Plan:   Seasonal allergies Multiple positive environmental allergens Plan Pattern of avoidance Continue Flonase  Asthma, moderate persistent Moderate persistent asthma now improved with multiple atopic factors and reflux disease Plan Continue Qvar 2 puff twice daily Continue Flonase daily    Updated Medication List Outpatient Encounter Prescriptions as of 10/04/2013  Medication Sig  . albuterol (PROVENTIL HFA;VENTOLIN HFA) 108 (90 BASE) MCG/ACT inhaler Inhale 2 puffs into the lungs every 6 (six) hours as needed for wheezing or shortness of breath.  . ALPRAZolam (XANAX) 0.25 MG tablet Take 0.5 tablets by mouth as needed.  Marland Kitchen amLODipine (NORVASC) 5 MG tablet Take 5 mg by mouth daily.  Marland Kitchen aspirin 81 MG tablet Take 81 mg by mouth daily.  . beclomethasone (QVAR) 80 MCG/ACT inhaler Inhale 2 puffs into the lungs 2 (two) times daily.  . Cholecalciferol (VITAMIN D-3) 5000 UNITS TABS Take by mouth. Take 1 tablet daily by mouth  . finasteride (PROSCAR) 5 MG tablet Take 0.5 tablets by mouth daily.  . fluticasone (FLONASE) 50 MCG/ACT nasal spray Place 2 sprays into both nostrils daily.  Marland Kitchen levothyroxine (SYNTHROID, LEVOTHROID) 75 MCG tablet Take 75 mcg by mouth daily before breakfast.  . Multiple Vitamins-Calcium (CALCI-MAX PO) Take by mouth daily.   . Multiple Vitamins-Minerals (MULTIVITAMIN WITH MINERALS) tablet Take 1 tablet by mouth daily.  Marland Kitchen  omeprazole (PRILOSEC) 20 MG capsule Take 20 mg by mouth daily.  . simvastatin (ZOCOR) 40 MG tablet Take 40 mg by mouth daily.  Marland Kitchen omega-3 fish oil (MAXEPA) 1000 MG CAPS capsule HOLD FOR NOW  . [DISCONTINUED] finasteride (PROPECIA) 1 MG tablet Take 0.5 mg by mouth daily.

## 2013-10-12 DIAGNOSIS — R1032 Left lower quadrant pain: Secondary | ICD-10-CM | POA: Diagnosis not present

## 2013-10-12 DIAGNOSIS — K5730 Diverticulosis of large intestine without perforation or abscess without bleeding: Secondary | ICD-10-CM | POA: Diagnosis not present

## 2013-10-12 DIAGNOSIS — K219 Gastro-esophageal reflux disease without esophagitis: Secondary | ICD-10-CM | POA: Diagnosis not present

## 2013-11-23 DIAGNOSIS — R1032 Left lower quadrant pain: Secondary | ICD-10-CM | POA: Diagnosis not present

## 2013-11-23 DIAGNOSIS — K5730 Diverticulosis of large intestine without perforation or abscess without bleeding: Secondary | ICD-10-CM | POA: Diagnosis not present

## 2013-11-23 DIAGNOSIS — K219 Gastro-esophageal reflux disease without esophagitis: Secondary | ICD-10-CM | POA: Diagnosis not present

## 2013-12-07 DIAGNOSIS — Z6826 Body mass index (BMI) 26.0-26.9, adult: Secondary | ICD-10-CM | POA: Diagnosis not present

## 2013-12-07 DIAGNOSIS — E785 Hyperlipidemia, unspecified: Secondary | ICD-10-CM | POA: Diagnosis not present

## 2013-12-07 DIAGNOSIS — M810 Age-related osteoporosis without current pathological fracture: Secondary | ICD-10-CM | POA: Diagnosis not present

## 2013-12-07 DIAGNOSIS — Z23 Encounter for immunization: Secondary | ICD-10-CM | POA: Diagnosis not present

## 2013-12-07 DIAGNOSIS — I10 Essential (primary) hypertension: Secondary | ICD-10-CM | POA: Diagnosis not present

## 2013-12-07 DIAGNOSIS — E890 Postprocedural hypothyroidism: Secondary | ICD-10-CM | POA: Diagnosis not present

## 2013-12-23 DIAGNOSIS — J209 Acute bronchitis, unspecified: Secondary | ICD-10-CM | POA: Diagnosis not present

## 2013-12-23 DIAGNOSIS — Z6826 Body mass index (BMI) 26.0-26.9, adult: Secondary | ICD-10-CM | POA: Diagnosis not present

## 2014-01-16 DIAGNOSIS — M810 Age-related osteoporosis without current pathological fracture: Secondary | ICD-10-CM | POA: Diagnosis not present

## 2014-03-21 ENCOUNTER — Ambulatory Visit (INDEPENDENT_AMBULATORY_CARE_PROVIDER_SITE_OTHER): Payer: Self-pay | Admitting: Critical Care Medicine

## 2014-03-21 ENCOUNTER — Encounter: Payer: Self-pay | Admitting: Critical Care Medicine

## 2014-03-21 VITALS — BP 128/66 | HR 91 | Temp 97.8°F | Ht 62.0 in | Wt 142.8 lb

## 2014-03-21 DIAGNOSIS — J4540 Moderate persistent asthma, uncomplicated: Secondary | ICD-10-CM

## 2014-03-21 MED ORDER — BECLOMETHASONE DIPROPIONATE 80 MCG/ACT IN AERS: 2.0000 | INHALATION_SPRAY | Freq: Every day | RESPIRATORY_TRACT | Status: DC

## 2014-03-21 NOTE — Patient Instructions (Addendum)
Stop fluticasone nasal spray Reduce Qvar to 25mcg two puff daily Albuterol as needed Stay off fish oil Return 6 months, sooner if needed

## 2014-03-21 NOTE — Progress Notes (Signed)
Subjective:    Patient ID: Brittany Santiago, female    DOB: [birth year], 81 y.o.   MRN: [id number]  HPI Comments: Recurrent bronchitis since 03/2013, 4 episodes. Lifelong never smoker. Last spell 5/28 .    03/21/2014 Chief Complaint  Patient presents with  . 4 month follow up    Breathing doing well overall.  Has an occas nonprod cough.  No SOB, wheezing, or chest tightness/CP.  Hx of bronchitis end od 11/2013 and did well with ABX alone.  Now no daily symptoms.  No chest pain or tightness. Pt stays independent.   Pt denies any significant sore throat, nasal congestion or excess secretions, fever, chills, sweats, unintended weight loss, pleurtic or exertional chest pain, orthopnea PND, or leg swelling Pt denies any increase in rescue therapy over baseline, denies waking up needing it or having any early am or nocturnal exacerbations of coughing/wheezing/or dyspnea. Pt also denies any obvious fluctuation in symptoms with  weather or environmental change or other alleviating or aggravating factors    PUL ASTHMA HISTORY 03/21/2014 10/04/2013  Symptoms 0-2 days/week 0-2 days/week  Nighttime awakenings 0-2/month 0-2/month  Interference with activity No limitations No limitations  SABA use 0-2 days/wk 0-2 days/wk  Exacerbations requiring oral steroids 0-1 / year 0-1 / year     Review of Systems  Constitutional: Negative for fatigue.  HENT: Negative for ear discharge, nosebleeds, sinus pressure, sneezing, trouble swallowing and voice change.   Respiratory: Negative for chest tightness.        Objective:   Physical Exam Filed Vitals:   03/21/14 1039  BP: 128/66  Pulse: 91  Temp: 97.8 F (36.6 C)  TempSrc: Oral  Height: 5\' 2"  (1.575 m)  Weight: 142 lb 12.8 oz (64.774 kg)  SpO2: 97%    Gen: Pleasant, well-nourished, in no distress,  normal affect  ENT: No lesions,  mouth clear,  oropharynx clear, + postnasal drip, improved rhinitis  Neck: No JVD, no TMG, no carotid  bruits  Lungs: No use of accessory muscles, no dullness to percussion, clear with improved airflow.  Cardiovascular: RRR, heart sounds normal, no murmur or gallops, no peripheral edema  Abdomen: soft and NT, no HSM,  BS normal  Musculoskeletal: No deformities, no cyanosis or clubbing  Neuro: alert, non focal  Skin: Warm, no lesions or rashes  No results found.      Assessment & Plan:   Asthma, moderate persistent Moderate persistent asthma improved airway function Plan Cont qvar but reduce to maintenance two puff daily Stop flonase Prn SABA Stay off fishoil Return 6 months or prn      Updated Medication List Outpatient Encounter Prescriptions as of 03/21/2014  Medication Sig  . albuterol (PROVENTIL HFA;VENTOLIN HFA) 108 (90 BASE) MCG/ACT inhaler Inhale 2 puffs into the lungs every 6 (six) hours as needed for wheezing or shortness of breath.  . ALPRAZolam (XANAX) 0.25 MG tablet Take 1 tablet by mouth as needed.   Marland Kitchen amLODipine (NORVASC) 5 MG tablet Take 5 mg by mouth daily.  Marland Kitchen aspirin 81 MG tablet Take 81 mg by mouth daily.  . Cholecalciferol (VITAMIN D-3) 5000 UNITS TABS Take by mouth. Take 1 tablet daily by mouth  . finasteride (PROSCAR) 5 MG tablet Take 0.5 tablets by mouth daily.  Marland Kitchen levothyroxine (SYNTHROID, LEVOTHROID) 75 MCG tablet Take 75 mcg by mouth daily before breakfast.  . Multiple Vitamins-Calcium (CALCI-MAX PO) Take by mouth daily.   . Multiple Vitamins-Minerals (MULTIVITAMIN WITH MINERALS) tablet Take 1 tablet by mouth  daily.  . omeprazole (PRILOSEC) 20 MG capsule Take 20 mg by mouth daily.  . promethazine-codeine (PHENERGAN WITH CODEINE) 6.25-10 MG/5ML syrup as needed.  . simvastatin (ZOCOR) 40 MG tablet Take 40 mg by mouth daily.  . beclomethasone (QVAR) 80 MCG/ACT inhaler Inhale 2 puffs into the lungs daily.  . [DISCONTINUED] beclomethasone (QVAR) 80 MCG/ACT inhaler Inhale 2 puffs into the lungs 2 (two) times daily. (Patient not taking: Reported on  03/21/2014)  . [DISCONTINUED] fluticasone (FLONASE) 50 MCG/ACT nasal spray Place 2 sprays into both nostrils daily. (Patient not taking: Reported on 03/21/2014)  . [DISCONTINUED] omega-3 fish oil (MAXEPA) 1000 MG CAPS capsule HOLD FOR NOW (Patient not taking: Reported on 03/21/2014)

## 2014-03-21 NOTE — Assessment & Plan Note (Signed)
Moderate persistent asthma improved airway function Plan Cont qvar but reduce to maintenance two puff daily Stop flonase Prn SABA Stay off fishoil Return 6 months or prn

## 2014-04-17 DIAGNOSIS — Z6826 Body mass index (BMI) 26.0-26.9, adult: Secondary | ICD-10-CM | POA: Diagnosis not present

## 2014-04-17 DIAGNOSIS — J309 Allergic rhinitis, unspecified: Secondary | ICD-10-CM | POA: Diagnosis not present

## 2014-04-17 DIAGNOSIS — H1013 Acute atopic conjunctivitis, bilateral: Secondary | ICD-10-CM | POA: Diagnosis not present

## 2014-06-13 DIAGNOSIS — I10 Essential (primary) hypertension: Secondary | ICD-10-CM | POA: Diagnosis not present

## 2014-06-13 DIAGNOSIS — Z6826 Body mass index (BMI) 26.0-26.9, adult: Secondary | ICD-10-CM | POA: Diagnosis not present

## 2014-06-13 DIAGNOSIS — E785 Hyperlipidemia, unspecified: Secondary | ICD-10-CM | POA: Diagnosis not present

## 2014-06-13 DIAGNOSIS — Z23 Encounter for immunization: Secondary | ICD-10-CM | POA: Diagnosis not present

## 2014-06-13 DIAGNOSIS — M810 Age-related osteoporosis without current pathological fracture: Secondary | ICD-10-CM | POA: Diagnosis not present

## 2014-06-13 DIAGNOSIS — E890 Postprocedural hypothyroidism: Secondary | ICD-10-CM | POA: Diagnosis not present

## 2014-06-29 ENCOUNTER — Ambulatory Visit: Payer: Medicare Other | Admitting: Podiatrist

## 2014-09-07 DIAGNOSIS — I10 Essential (primary) hypertension: Secondary | ICD-10-CM | POA: Diagnosis not present

## 2014-09-07 DIAGNOSIS — Z7982 Long term (current) use of aspirin: Secondary | ICD-10-CM | POA: Diagnosis not present

## 2014-09-07 DIAGNOSIS — K219 Gastro-esophageal reflux disease without esophagitis: Secondary | ICD-10-CM | POA: Diagnosis not present

## 2014-09-07 DIAGNOSIS — Z6827 Body mass index (BMI) 27.0-27.9, adult: Secondary | ICD-10-CM | POA: Diagnosis not present

## 2014-09-07 DIAGNOSIS — E039 Hypothyroidism, unspecified: Secondary | ICD-10-CM | POA: Diagnosis not present

## 2014-09-07 DIAGNOSIS — R072 Precordial pain: Secondary | ICD-10-CM | POA: Diagnosis not present

## 2014-09-07 DIAGNOSIS — I447 Left bundle-branch block, unspecified: Secondary | ICD-10-CM | POA: Diagnosis not present

## 2014-09-07 DIAGNOSIS — R079 Chest pain, unspecified: Secondary | ICD-10-CM | POA: Diagnosis not present

## 2014-09-07 DIAGNOSIS — Z79899 Other long term (current) drug therapy: Secondary | ICD-10-CM | POA: Diagnosis not present

## 2014-09-07 DIAGNOSIS — E785 Hyperlipidemia, unspecified: Secondary | ICD-10-CM | POA: Diagnosis not present

## 2014-09-07 DIAGNOSIS — R0600 Dyspnea, unspecified: Secondary | ICD-10-CM | POA: Diagnosis not present

## 2014-09-07 DIAGNOSIS — R0609 Other forms of dyspnea: Secondary | ICD-10-CM | POA: Diagnosis not present

## 2014-09-07 DIAGNOSIS — Z8249 Family history of ischemic heart disease and other diseases of the circulatory system: Secondary | ICD-10-CM | POA: Diagnosis not present

## 2014-09-07 DIAGNOSIS — E782 Mixed hyperlipidemia: Secondary | ICD-10-CM | POA: Diagnosis not present

## 2014-09-07 DIAGNOSIS — J45909 Unspecified asthma, uncomplicated: Secondary | ICD-10-CM | POA: Diagnosis not present

## 2014-09-08 DIAGNOSIS — R069 Unspecified abnormalities of breathing: Secondary | ICD-10-CM | POA: Diagnosis not present

## 2014-09-08 DIAGNOSIS — I10 Essential (primary) hypertension: Secondary | ICD-10-CM | POA: Diagnosis not present

## 2014-09-08 DIAGNOSIS — E782 Mixed hyperlipidemia: Secondary | ICD-10-CM | POA: Diagnosis not present

## 2014-09-08 DIAGNOSIS — R079 Chest pain, unspecified: Secondary | ICD-10-CM | POA: Diagnosis not present

## 2014-09-08 DIAGNOSIS — R0609 Other forms of dyspnea: Secondary | ICD-10-CM | POA: Diagnosis not present

## 2014-09-08 DIAGNOSIS — R072 Precordial pain: Secondary | ICD-10-CM | POA: Diagnosis not present

## 2014-09-14 DIAGNOSIS — R0602 Shortness of breath: Secondary | ICD-10-CM | POA: Diagnosis not present

## 2014-09-14 DIAGNOSIS — Z9181 History of falling: Secondary | ICD-10-CM | POA: Diagnosis not present

## 2014-09-14 DIAGNOSIS — R079 Chest pain, unspecified: Secondary | ICD-10-CM | POA: Diagnosis not present

## 2014-09-14 DIAGNOSIS — I10 Essential (primary) hypertension: Secondary | ICD-10-CM | POA: Diagnosis not present

## 2014-09-14 DIAGNOSIS — Z1389 Encounter for screening for other disorder: Secondary | ICD-10-CM | POA: Diagnosis not present

## 2014-09-14 DIAGNOSIS — Z6827 Body mass index (BMI) 27.0-27.9, adult: Secondary | ICD-10-CM | POA: Diagnosis not present

## 2014-09-16 DIAGNOSIS — R1033 Periumbilical pain: Secondary | ICD-10-CM | POA: Diagnosis not present

## 2014-09-20 DIAGNOSIS — J9811 Atelectasis: Secondary | ICD-10-CM | POA: Diagnosis not present

## 2014-09-20 DIAGNOSIS — R0602 Shortness of breath: Secondary | ICD-10-CM | POA: Diagnosis not present

## 2014-09-20 DIAGNOSIS — R079 Chest pain, unspecified: Secondary | ICD-10-CM | POA: Diagnosis not present

## 2014-09-20 DIAGNOSIS — I2510 Atherosclerotic heart disease of native coronary artery without angina pectoris: Secondary | ICD-10-CM | POA: Diagnosis not present

## 2014-10-03 DIAGNOSIS — I447 Left bundle-branch block, unspecified: Secondary | ICD-10-CM | POA: Diagnosis not present

## 2014-10-03 DIAGNOSIS — R0789 Other chest pain: Secondary | ICD-10-CM | POA: Diagnosis not present

## 2014-10-03 DIAGNOSIS — Z6826 Body mass index (BMI) 26.0-26.9, adult: Secondary | ICD-10-CM | POA: Diagnosis not present

## 2014-10-03 DIAGNOSIS — I2510 Atherosclerotic heart disease of native coronary artery without angina pectoris: Secondary | ICD-10-CM | POA: Diagnosis not present

## 2014-10-14 DIAGNOSIS — Z6825 Body mass index (BMI) 25.0-25.9, adult: Secondary | ICD-10-CM | POA: Diagnosis not present

## 2014-10-14 DIAGNOSIS — M25512 Pain in left shoulder: Secondary | ICD-10-CM | POA: Diagnosis not present

## 2014-10-14 DIAGNOSIS — M5412 Radiculopathy, cervical region: Secondary | ICD-10-CM | POA: Diagnosis not present

## 2014-10-14 DIAGNOSIS — H1013 Acute atopic conjunctivitis, bilateral: Secondary | ICD-10-CM | POA: Diagnosis not present

## 2014-10-16 DIAGNOSIS — M85812 Other specified disorders of bone density and structure, left shoulder: Secondary | ICD-10-CM | POA: Diagnosis not present

## 2014-10-16 DIAGNOSIS — M5032 Other cervical disc degeneration, mid-cervical region: Secondary | ICD-10-CM | POA: Diagnosis not present

## 2014-10-16 DIAGNOSIS — M25512 Pain in left shoulder: Secondary | ICD-10-CM | POA: Diagnosis not present

## 2014-10-16 DIAGNOSIS — M5412 Radiculopathy, cervical region: Secondary | ICD-10-CM | POA: Diagnosis not present

## 2014-10-25 DIAGNOSIS — M7552 Bursitis of left shoulder: Secondary | ICD-10-CM | POA: Diagnosis not present

## 2014-10-27 DIAGNOSIS — M25612 Stiffness of left shoulder, not elsewhere classified: Secondary | ICD-10-CM | POA: Diagnosis not present

## 2014-10-27 DIAGNOSIS — M25512 Pain in left shoulder: Secondary | ICD-10-CM | POA: Diagnosis not present

## 2014-10-27 DIAGNOSIS — M7552 Bursitis of left shoulder: Secondary | ICD-10-CM | POA: Diagnosis not present

## 2014-10-31 DIAGNOSIS — I10 Essential (primary) hypertension: Secondary | ICD-10-CM | POA: Diagnosis not present

## 2014-10-31 DIAGNOSIS — Z6825 Body mass index (BMI) 25.0-25.9, adult: Secondary | ICD-10-CM | POA: Diagnosis not present

## 2014-12-20 DIAGNOSIS — Z23 Encounter for immunization: Secondary | ICD-10-CM | POA: Diagnosis not present

## 2014-12-20 DIAGNOSIS — E890 Postprocedural hypothyroidism: Secondary | ICD-10-CM | POA: Diagnosis not present

## 2014-12-20 DIAGNOSIS — Z6825 Body mass index (BMI) 25.0-25.9, adult: Secondary | ICD-10-CM | POA: Diagnosis not present

## 2014-12-20 DIAGNOSIS — M810 Age-related osteoporosis without current pathological fracture: Secondary | ICD-10-CM | POA: Diagnosis not present

## 2014-12-20 DIAGNOSIS — E559 Vitamin D deficiency, unspecified: Secondary | ICD-10-CM | POA: Diagnosis not present

## 2014-12-20 DIAGNOSIS — I10 Essential (primary) hypertension: Secondary | ICD-10-CM | POA: Diagnosis not present

## 2014-12-20 DIAGNOSIS — E785 Hyperlipidemia, unspecified: Secondary | ICD-10-CM | POA: Diagnosis not present

## 2015-01-30 DIAGNOSIS — Z6825 Body mass index (BMI) 25.0-25.9, adult: Secondary | ICD-10-CM | POA: Diagnosis not present

## 2015-01-30 DIAGNOSIS — R079 Chest pain, unspecified: Secondary | ICD-10-CM | POA: Diagnosis not present

## 2015-01-30 DIAGNOSIS — I2510 Atherosclerotic heart disease of native coronary artery without angina pectoris: Secondary | ICD-10-CM | POA: Diagnosis not present

## 2015-02-07 DIAGNOSIS — K5730 Diverticulosis of large intestine without perforation or abscess without bleeding: Secondary | ICD-10-CM | POA: Diagnosis not present

## 2015-02-07 DIAGNOSIS — K219 Gastro-esophageal reflux disease without esophagitis: Secondary | ICD-10-CM | POA: Diagnosis not present

## 2015-02-07 DIAGNOSIS — R1032 Left lower quadrant pain: Secondary | ICD-10-CM | POA: Diagnosis not present

## 2015-02-22 DIAGNOSIS — R1031 Right lower quadrant pain: Secondary | ICD-10-CM | POA: Diagnosis not present

## 2015-02-25 HISTORY — PX: CORONARY ARTERY BYPASS GRAFT: SHX141

## 2015-03-07 DIAGNOSIS — D485 Neoplasm of uncertain behavior of skin: Secondary | ICD-10-CM | POA: Diagnosis not present

## 2015-03-07 DIAGNOSIS — L219 Seborrheic dermatitis, unspecified: Secondary | ICD-10-CM | POA: Diagnosis not present

## 2015-03-09 DIAGNOSIS — I2510 Atherosclerotic heart disease of native coronary artery without angina pectoris: Secondary | ICD-10-CM | POA: Diagnosis not present

## 2015-03-09 DIAGNOSIS — R079 Chest pain, unspecified: Secondary | ICD-10-CM | POA: Diagnosis not present

## 2015-03-09 DIAGNOSIS — I447 Left bundle-branch block, unspecified: Secondary | ICD-10-CM | POA: Diagnosis not present

## 2015-03-14 DIAGNOSIS — I517 Cardiomegaly: Secondary | ICD-10-CM | POA: Diagnosis not present

## 2015-03-14 DIAGNOSIS — I447 Left bundle-branch block, unspecified: Secondary | ICD-10-CM | POA: Diagnosis not present

## 2015-03-14 DIAGNOSIS — E785 Hyperlipidemia, unspecified: Secondary | ICD-10-CM | POA: Diagnosis not present

## 2015-03-14 DIAGNOSIS — I25119 Atherosclerotic heart disease of native coronary artery with unspecified angina pectoris: Secondary | ICD-10-CM | POA: Diagnosis not present

## 2015-03-14 DIAGNOSIS — K449 Diaphragmatic hernia without obstruction or gangrene: Secondary | ICD-10-CM | POA: Diagnosis not present

## 2015-03-14 DIAGNOSIS — I10 Essential (primary) hypertension: Secondary | ICD-10-CM | POA: Diagnosis not present

## 2015-03-14 DIAGNOSIS — R079 Chest pain, unspecified: Secondary | ICD-10-CM | POA: Diagnosis not present

## 2015-03-14 DIAGNOSIS — I25118 Atherosclerotic heart disease of native coronary artery with other forms of angina pectoris: Secondary | ICD-10-CM | POA: Diagnosis not present

## 2015-03-14 DIAGNOSIS — I6523 Occlusion and stenosis of bilateral carotid arteries: Secondary | ICD-10-CM | POA: Diagnosis not present

## 2015-03-15 DIAGNOSIS — L509 Urticaria, unspecified: Secondary | ICD-10-CM | POA: Diagnosis not present

## 2015-03-15 DIAGNOSIS — Z79899 Other long term (current) drug therapy: Secondary | ICD-10-CM | POA: Diagnosis not present

## 2015-03-15 DIAGNOSIS — E785 Hyperlipidemia, unspecified: Secondary | ICD-10-CM | POA: Diagnosis not present

## 2015-03-15 DIAGNOSIS — J9811 Atelectasis: Secondary | ICD-10-CM | POA: Diagnosis not present

## 2015-03-15 DIAGNOSIS — I25118 Atherosclerotic heart disease of native coronary artery with other forms of angina pectoris: Secondary | ICD-10-CM | POA: Diagnosis not present

## 2015-03-15 DIAGNOSIS — N390 Urinary tract infection, site not specified: Secondary | ICD-10-CM | POA: Diagnosis not present

## 2015-03-15 DIAGNOSIS — I499 Cardiac arrhythmia, unspecified: Secondary | ICD-10-CM | POA: Diagnosis not present

## 2015-03-15 DIAGNOSIS — R269 Unspecified abnormalities of gait and mobility: Secondary | ICD-10-CM | POA: Diagnosis not present

## 2015-03-15 DIAGNOSIS — Z48812 Encounter for surgical aftercare following surgery on the circulatory system: Secondary | ICD-10-CM | POA: Diagnosis not present

## 2015-03-15 DIAGNOSIS — F419 Anxiety disorder, unspecified: Secondary | ICD-10-CM | POA: Diagnosis present

## 2015-03-15 DIAGNOSIS — I2584 Coronary atherosclerosis due to calcified coronary lesion: Secondary | ICD-10-CM | POA: Diagnosis present

## 2015-03-15 DIAGNOSIS — Z6825 Body mass index (BMI) 25.0-25.9, adult: Secondary | ICD-10-CM | POA: Diagnosis not present

## 2015-03-15 DIAGNOSIS — J90 Pleural effusion, not elsewhere classified: Secondary | ICD-10-CM | POA: Diagnosis not present

## 2015-03-15 DIAGNOSIS — G2581 Restless legs syndrome: Secondary | ICD-10-CM | POA: Diagnosis present

## 2015-03-15 DIAGNOSIS — I447 Left bundle-branch block, unspecified: Secondary | ICD-10-CM | POA: Diagnosis not present

## 2015-03-15 DIAGNOSIS — Z888 Allergy status to other drugs, medicaments and biological substances status: Secondary | ICD-10-CM | POA: Diagnosis not present

## 2015-03-15 DIAGNOSIS — I700 Atherosclerosis of aorta: Secondary | ICD-10-CM | POA: Diagnosis not present

## 2015-03-15 DIAGNOSIS — Z6827 Body mass index (BMI) 27.0-27.9, adult: Secondary | ICD-10-CM | POA: Diagnosis not present

## 2015-03-15 DIAGNOSIS — I2510 Atherosclerotic heart disease of native coronary artery without angina pectoris: Secondary | ICD-10-CM | POA: Diagnosis not present

## 2015-03-15 DIAGNOSIS — E876 Hypokalemia: Secondary | ICD-10-CM | POA: Diagnosis not present

## 2015-03-15 DIAGNOSIS — Z7982 Long term (current) use of aspirin: Secondary | ICD-10-CM | POA: Diagnosis not present

## 2015-03-15 DIAGNOSIS — J45909 Unspecified asthma, uncomplicated: Secondary | ICD-10-CM | POA: Diagnosis present

## 2015-03-15 DIAGNOSIS — I10 Essential (primary) hypertension: Secondary | ICD-10-CM | POA: Diagnosis not present

## 2015-03-15 DIAGNOSIS — K219 Gastro-esophageal reflux disease without esophagitis: Secondary | ICD-10-CM | POA: Diagnosis present

## 2015-03-15 DIAGNOSIS — R54 Age-related physical debility: Secondary | ICD-10-CM | POA: Diagnosis not present

## 2015-03-15 DIAGNOSIS — Z951 Presence of aortocoronary bypass graft: Secondary | ICD-10-CM | POA: Diagnosis not present

## 2015-03-15 DIAGNOSIS — E039 Hypothyroidism, unspecified: Secondary | ICD-10-CM | POA: Diagnosis present

## 2015-03-15 DIAGNOSIS — R079 Chest pain, unspecified: Secondary | ICD-10-CM | POA: Diagnosis not present

## 2015-03-16 DIAGNOSIS — I25118 Atherosclerotic heart disease of native coronary artery with other forms of angina pectoris: Secondary | ICD-10-CM | POA: Insufficient documentation

## 2015-03-16 DIAGNOSIS — I2510 Atherosclerotic heart disease of native coronary artery without angina pectoris: Secondary | ICD-10-CM | POA: Insufficient documentation

## 2015-03-16 DIAGNOSIS — Z951 Presence of aortocoronary bypass graft: Secondary | ICD-10-CM | POA: Insufficient documentation

## 2015-03-16 HISTORY — DX: Presence of aortocoronary bypass graft: Z95.1

## 2015-03-16 HISTORY — DX: Atherosclerotic heart disease of native coronary artery with other forms of angina pectoris: I25.118

## 2015-03-16 HISTORY — DX: Atherosclerotic heart disease of native coronary artery without angina pectoris: I25.10

## 2015-03-20 DIAGNOSIS — Z888 Allergy status to other drugs, medicaments and biological substances status: Secondary | ICD-10-CM | POA: Diagnosis not present

## 2015-03-20 DIAGNOSIS — Z951 Presence of aortocoronary bypass graft: Secondary | ICD-10-CM | POA: Diagnosis not present

## 2015-03-20 DIAGNOSIS — R269 Unspecified abnormalities of gait and mobility: Secondary | ICD-10-CM | POA: Diagnosis present

## 2015-03-20 DIAGNOSIS — I25119 Atherosclerotic heart disease of native coronary artery with unspecified angina pectoris: Secondary | ICD-10-CM | POA: Diagnosis present

## 2015-03-20 DIAGNOSIS — L509 Urticaria, unspecified: Secondary | ICD-10-CM | POA: Diagnosis not present

## 2015-03-20 DIAGNOSIS — N390 Urinary tract infection, site not specified: Secondary | ICD-10-CM | POA: Diagnosis not present

## 2015-03-20 DIAGNOSIS — G2581 Restless legs syndrome: Secondary | ICD-10-CM | POA: Diagnosis present

## 2015-03-20 DIAGNOSIS — R509 Fever, unspecified: Secondary | ICD-10-CM | POA: Diagnosis not present

## 2015-03-20 DIAGNOSIS — Z6825 Body mass index (BMI) 25.0-25.9, adult: Secondary | ICD-10-CM | POA: Diagnosis not present

## 2015-03-20 DIAGNOSIS — I447 Left bundle-branch block, unspecified: Secondary | ICD-10-CM | POA: Diagnosis not present

## 2015-03-20 DIAGNOSIS — Z8249 Family history of ischemic heart disease and other diseases of the circulatory system: Secondary | ICD-10-CM | POA: Diagnosis not present

## 2015-03-20 DIAGNOSIS — I10 Essential (primary) hypertension: Secondary | ICD-10-CM | POA: Diagnosis present

## 2015-03-20 DIAGNOSIS — E039 Hypothyroidism, unspecified: Secondary | ICD-10-CM | POA: Diagnosis present

## 2015-03-20 DIAGNOSIS — I25118 Atherosclerotic heart disease of native coronary artery with other forms of angina pectoris: Secondary | ICD-10-CM | POA: Diagnosis not present

## 2015-03-20 DIAGNOSIS — G4700 Insomnia, unspecified: Secondary | ICD-10-CM | POA: Diagnosis present

## 2015-03-20 DIAGNOSIS — E785 Hyperlipidemia, unspecified: Secondary | ICD-10-CM | POA: Diagnosis present

## 2015-03-20 DIAGNOSIS — K219 Gastro-esophageal reflux disease without esophagitis: Secondary | ICD-10-CM | POA: Diagnosis present

## 2015-03-20 DIAGNOSIS — Z48812 Encounter for surgical aftercare following surgery on the circulatory system: Secondary | ICD-10-CM | POA: Diagnosis not present

## 2015-03-20 DIAGNOSIS — I2510 Atherosclerotic heart disease of native coronary artery without angina pectoris: Secondary | ICD-10-CM | POA: Diagnosis not present

## 2015-03-20 DIAGNOSIS — F419 Anxiety disorder, unspecified: Secondary | ICD-10-CM | POA: Diagnosis present

## 2015-03-20 DIAGNOSIS — J45909 Unspecified asthma, uncomplicated: Secondary | ICD-10-CM | POA: Diagnosis present

## 2015-03-25 DIAGNOSIS — R509 Fever, unspecified: Secondary | ICD-10-CM | POA: Diagnosis not present

## 2015-03-28 DIAGNOSIS — I2510 Atherosclerotic heart disease of native coronary artery without angina pectoris: Secondary | ICD-10-CM | POA: Diagnosis not present

## 2015-03-28 DIAGNOSIS — J4540 Moderate persistent asthma, uncomplicated: Secondary | ICD-10-CM | POA: Diagnosis not present

## 2015-03-28 DIAGNOSIS — N390 Urinary tract infection, site not specified: Secondary | ICD-10-CM | POA: Diagnosis not present

## 2015-03-28 DIAGNOSIS — I10 Essential (primary) hypertension: Secondary | ICD-10-CM | POA: Diagnosis not present

## 2015-03-28 DIAGNOSIS — E890 Postprocedural hypothyroidism: Secondary | ICD-10-CM | POA: Diagnosis not present

## 2015-03-28 DIAGNOSIS — Z9181 History of falling: Secondary | ICD-10-CM | POA: Diagnosis not present

## 2015-03-28 DIAGNOSIS — E785 Hyperlipidemia, unspecified: Secondary | ICD-10-CM | POA: Diagnosis not present

## 2015-03-28 DIAGNOSIS — Z48812 Encounter for surgical aftercare following surgery on the circulatory system: Secondary | ICD-10-CM | POA: Diagnosis not present

## 2015-03-28 DIAGNOSIS — M47817 Spondylosis without myelopathy or radiculopathy, lumbosacral region: Secondary | ICD-10-CM | POA: Diagnosis not present

## 2015-03-28 DIAGNOSIS — Z7982 Long term (current) use of aspirin: Secondary | ICD-10-CM | POA: Diagnosis not present

## 2015-03-28 DIAGNOSIS — F419 Anxiety disorder, unspecified: Secondary | ICD-10-CM | POA: Diagnosis not present

## 2015-03-28 DIAGNOSIS — I6529 Occlusion and stenosis of unspecified carotid artery: Secondary | ICD-10-CM | POA: Diagnosis not present

## 2015-03-28 DIAGNOSIS — Z96641 Presence of right artificial hip joint: Secondary | ICD-10-CM | POA: Diagnosis not present

## 2015-03-28 DIAGNOSIS — Z4801 Encounter for change or removal of surgical wound dressing: Secondary | ICD-10-CM | POA: Diagnosis not present

## 2015-03-30 DIAGNOSIS — J4540 Moderate persistent asthma, uncomplicated: Secondary | ICD-10-CM | POA: Diagnosis not present

## 2015-03-30 DIAGNOSIS — M47817 Spondylosis without myelopathy or radiculopathy, lumbosacral region: Secondary | ICD-10-CM | POA: Diagnosis not present

## 2015-03-30 DIAGNOSIS — I2510 Atherosclerotic heart disease of native coronary artery without angina pectoris: Secondary | ICD-10-CM | POA: Diagnosis not present

## 2015-03-30 DIAGNOSIS — I10 Essential (primary) hypertension: Secondary | ICD-10-CM | POA: Diagnosis not present

## 2015-03-30 DIAGNOSIS — Z48812 Encounter for surgical aftercare following surgery on the circulatory system: Secondary | ICD-10-CM | POA: Diagnosis not present

## 2015-03-30 DIAGNOSIS — N390 Urinary tract infection, site not specified: Secondary | ICD-10-CM | POA: Diagnosis not present

## 2015-04-02 DIAGNOSIS — Z48812 Encounter for surgical aftercare following surgery on the circulatory system: Secondary | ICD-10-CM | POA: Diagnosis not present

## 2015-04-02 DIAGNOSIS — I10 Essential (primary) hypertension: Secondary | ICD-10-CM | POA: Diagnosis not present

## 2015-04-02 DIAGNOSIS — I2510 Atherosclerotic heart disease of native coronary artery without angina pectoris: Secondary | ICD-10-CM | POA: Diagnosis not present

## 2015-04-02 DIAGNOSIS — M47817 Spondylosis without myelopathy or radiculopathy, lumbosacral region: Secondary | ICD-10-CM | POA: Diagnosis not present

## 2015-04-02 DIAGNOSIS — J4540 Moderate persistent asthma, uncomplicated: Secondary | ICD-10-CM | POA: Diagnosis not present

## 2015-04-02 DIAGNOSIS — Z6824 Body mass index (BMI) 24.0-24.9, adult: Secondary | ICD-10-CM | POA: Diagnosis not present

## 2015-04-02 DIAGNOSIS — E785 Hyperlipidemia, unspecified: Secondary | ICD-10-CM | POA: Diagnosis not present

## 2015-04-02 DIAGNOSIS — N390 Urinary tract infection, site not specified: Secondary | ICD-10-CM | POA: Diagnosis not present

## 2015-04-03 DIAGNOSIS — Z48812 Encounter for surgical aftercare following surgery on the circulatory system: Secondary | ICD-10-CM | POA: Diagnosis not present

## 2015-04-03 DIAGNOSIS — M47817 Spondylosis without myelopathy or radiculopathy, lumbosacral region: Secondary | ICD-10-CM | POA: Diagnosis not present

## 2015-04-03 DIAGNOSIS — I2510 Atherosclerotic heart disease of native coronary artery without angina pectoris: Secondary | ICD-10-CM | POA: Diagnosis not present

## 2015-04-03 DIAGNOSIS — I10 Essential (primary) hypertension: Secondary | ICD-10-CM | POA: Diagnosis not present

## 2015-04-03 DIAGNOSIS — J4540 Moderate persistent asthma, uncomplicated: Secondary | ICD-10-CM | POA: Diagnosis not present

## 2015-04-03 DIAGNOSIS — N390 Urinary tract infection, site not specified: Secondary | ICD-10-CM | POA: Diagnosis not present

## 2015-04-04 DIAGNOSIS — I10 Essential (primary) hypertension: Secondary | ICD-10-CM | POA: Diagnosis not present

## 2015-04-04 DIAGNOSIS — M47817 Spondylosis without myelopathy or radiculopathy, lumbosacral region: Secondary | ICD-10-CM | POA: Diagnosis not present

## 2015-04-04 DIAGNOSIS — J4540 Moderate persistent asthma, uncomplicated: Secondary | ICD-10-CM | POA: Diagnosis not present

## 2015-04-04 DIAGNOSIS — I2510 Atherosclerotic heart disease of native coronary artery without angina pectoris: Secondary | ICD-10-CM | POA: Diagnosis not present

## 2015-04-04 DIAGNOSIS — Z48812 Encounter for surgical aftercare following surgery on the circulatory system: Secondary | ICD-10-CM | POA: Diagnosis not present

## 2015-04-04 DIAGNOSIS — N390 Urinary tract infection, site not specified: Secondary | ICD-10-CM | POA: Diagnosis not present

## 2015-04-06 DIAGNOSIS — I2510 Atherosclerotic heart disease of native coronary artery without angina pectoris: Secondary | ICD-10-CM | POA: Diagnosis not present

## 2015-04-06 DIAGNOSIS — N390 Urinary tract infection, site not specified: Secondary | ICD-10-CM | POA: Diagnosis not present

## 2015-04-06 DIAGNOSIS — M47817 Spondylosis without myelopathy or radiculopathy, lumbosacral region: Secondary | ICD-10-CM | POA: Diagnosis not present

## 2015-04-06 DIAGNOSIS — J4540 Moderate persistent asthma, uncomplicated: Secondary | ICD-10-CM | POA: Diagnosis not present

## 2015-04-06 DIAGNOSIS — Z48812 Encounter for surgical aftercare following surgery on the circulatory system: Secondary | ICD-10-CM | POA: Diagnosis not present

## 2015-04-06 DIAGNOSIS — I10 Essential (primary) hypertension: Secondary | ICD-10-CM | POA: Diagnosis not present

## 2015-04-10 DIAGNOSIS — I2510 Atherosclerotic heart disease of native coronary artery without angina pectoris: Secondary | ICD-10-CM | POA: Diagnosis not present

## 2015-04-10 DIAGNOSIS — N390 Urinary tract infection, site not specified: Secondary | ICD-10-CM | POA: Diagnosis not present

## 2015-04-10 DIAGNOSIS — I10 Essential (primary) hypertension: Secondary | ICD-10-CM | POA: Diagnosis not present

## 2015-04-10 DIAGNOSIS — J4540 Moderate persistent asthma, uncomplicated: Secondary | ICD-10-CM | POA: Diagnosis not present

## 2015-04-10 DIAGNOSIS — M47817 Spondylosis without myelopathy or radiculopathy, lumbosacral region: Secondary | ICD-10-CM | POA: Diagnosis not present

## 2015-04-10 DIAGNOSIS — Z48812 Encounter for surgical aftercare following surgery on the circulatory system: Secondary | ICD-10-CM | POA: Diagnosis not present

## 2015-04-12 DIAGNOSIS — I447 Left bundle-branch block, unspecified: Secondary | ICD-10-CM | POA: Diagnosis not present

## 2015-04-12 DIAGNOSIS — Z6825 Body mass index (BMI) 25.0-25.9, adult: Secondary | ICD-10-CM | POA: Diagnosis not present

## 2015-04-12 DIAGNOSIS — E785 Hyperlipidemia, unspecified: Secondary | ICD-10-CM | POA: Diagnosis not present

## 2015-04-12 DIAGNOSIS — I2510 Atherosclerotic heart disease of native coronary artery without angina pectoris: Secondary | ICD-10-CM | POA: Diagnosis not present

## 2015-04-12 DIAGNOSIS — Z951 Presence of aortocoronary bypass graft: Secondary | ICD-10-CM | POA: Diagnosis not present

## 2015-04-13 DIAGNOSIS — J4540 Moderate persistent asthma, uncomplicated: Secondary | ICD-10-CM | POA: Diagnosis not present

## 2015-04-13 DIAGNOSIS — Z48812 Encounter for surgical aftercare following surgery on the circulatory system: Secondary | ICD-10-CM | POA: Diagnosis not present

## 2015-04-13 DIAGNOSIS — M47817 Spondylosis without myelopathy or radiculopathy, lumbosacral region: Secondary | ICD-10-CM | POA: Diagnosis not present

## 2015-04-13 DIAGNOSIS — I10 Essential (primary) hypertension: Secondary | ICD-10-CM | POA: Diagnosis not present

## 2015-04-13 DIAGNOSIS — I2510 Atherosclerotic heart disease of native coronary artery without angina pectoris: Secondary | ICD-10-CM | POA: Diagnosis not present

## 2015-04-13 DIAGNOSIS — N390 Urinary tract infection, site not specified: Secondary | ICD-10-CM | POA: Diagnosis not present

## 2015-04-18 DIAGNOSIS — I10 Essential (primary) hypertension: Secondary | ICD-10-CM | POA: Diagnosis not present

## 2015-04-18 DIAGNOSIS — I2510 Atherosclerotic heart disease of native coronary artery without angina pectoris: Secondary | ICD-10-CM | POA: Diagnosis not present

## 2015-04-18 DIAGNOSIS — J4540 Moderate persistent asthma, uncomplicated: Secondary | ICD-10-CM | POA: Diagnosis not present

## 2015-04-18 DIAGNOSIS — Z48812 Encounter for surgical aftercare following surgery on the circulatory system: Secondary | ICD-10-CM | POA: Diagnosis not present

## 2015-04-18 DIAGNOSIS — N390 Urinary tract infection, site not specified: Secondary | ICD-10-CM | POA: Diagnosis not present

## 2015-04-18 DIAGNOSIS — M47817 Spondylosis without myelopathy or radiculopathy, lumbosacral region: Secondary | ICD-10-CM | POA: Diagnosis not present

## 2015-05-22 DIAGNOSIS — H04123 Dry eye syndrome of bilateral lacrimal glands: Secondary | ICD-10-CM | POA: Diagnosis not present

## 2015-05-22 DIAGNOSIS — H524 Presbyopia: Secondary | ICD-10-CM | POA: Diagnosis not present

## 2015-05-22 DIAGNOSIS — H5213 Myopia, bilateral: Secondary | ICD-10-CM | POA: Diagnosis not present

## 2015-05-22 DIAGNOSIS — Z961 Presence of intraocular lens: Secondary | ICD-10-CM | POA: Diagnosis not present

## 2015-05-29 DIAGNOSIS — Z951 Presence of aortocoronary bypass graft: Secondary | ICD-10-CM | POA: Diagnosis not present

## 2015-05-30 DIAGNOSIS — Z951 Presence of aortocoronary bypass graft: Secondary | ICD-10-CM | POA: Diagnosis not present

## 2015-06-01 DIAGNOSIS — Z951 Presence of aortocoronary bypass graft: Secondary | ICD-10-CM | POA: Diagnosis not present

## 2015-06-04 DIAGNOSIS — L570 Actinic keratosis: Secondary | ICD-10-CM | POA: Diagnosis not present

## 2015-06-04 DIAGNOSIS — Z951 Presence of aortocoronary bypass graft: Secondary | ICD-10-CM | POA: Diagnosis not present

## 2015-06-06 DIAGNOSIS — Z951 Presence of aortocoronary bypass graft: Secondary | ICD-10-CM | POA: Diagnosis not present

## 2015-06-07 DIAGNOSIS — Z951 Presence of aortocoronary bypass graft: Secondary | ICD-10-CM | POA: Diagnosis not present

## 2015-06-11 DIAGNOSIS — Z951 Presence of aortocoronary bypass graft: Secondary | ICD-10-CM | POA: Diagnosis not present

## 2015-06-13 DIAGNOSIS — Z951 Presence of aortocoronary bypass graft: Secondary | ICD-10-CM | POA: Diagnosis not present

## 2015-06-15 DIAGNOSIS — Z951 Presence of aortocoronary bypass graft: Secondary | ICD-10-CM | POA: Diagnosis not present

## 2015-06-18 DIAGNOSIS — Z951 Presence of aortocoronary bypass graft: Secondary | ICD-10-CM | POA: Diagnosis not present

## 2015-06-20 DIAGNOSIS — Z951 Presence of aortocoronary bypass graft: Secondary | ICD-10-CM | POA: Diagnosis not present

## 2015-06-22 DIAGNOSIS — Z951 Presence of aortocoronary bypass graft: Secondary | ICD-10-CM | POA: Diagnosis not present

## 2015-06-25 DIAGNOSIS — Z6824 Body mass index (BMI) 24.0-24.9, adult: Secondary | ICD-10-CM | POA: Diagnosis not present

## 2015-06-25 DIAGNOSIS — I2510 Atherosclerotic heart disease of native coronary artery without angina pectoris: Secondary | ICD-10-CM | POA: Diagnosis not present

## 2015-06-25 DIAGNOSIS — Z951 Presence of aortocoronary bypass graft: Secondary | ICD-10-CM | POA: Diagnosis not present

## 2015-06-25 DIAGNOSIS — I10 Essential (primary) hypertension: Secondary | ICD-10-CM | POA: Diagnosis not present

## 2015-06-25 DIAGNOSIS — E785 Hyperlipidemia, unspecified: Secondary | ICD-10-CM | POA: Diagnosis not present

## 2015-06-25 DIAGNOSIS — E890 Postprocedural hypothyroidism: Secondary | ICD-10-CM | POA: Diagnosis not present

## 2015-06-25 DIAGNOSIS — E559 Vitamin D deficiency, unspecified: Secondary | ICD-10-CM | POA: Diagnosis not present

## 2015-06-27 DIAGNOSIS — Z951 Presence of aortocoronary bypass graft: Secondary | ICD-10-CM | POA: Diagnosis not present

## 2015-06-29 DIAGNOSIS — Z951 Presence of aortocoronary bypass graft: Secondary | ICD-10-CM | POA: Diagnosis not present

## 2015-07-02 DIAGNOSIS — Z951 Presence of aortocoronary bypass graft: Secondary | ICD-10-CM | POA: Diagnosis not present

## 2015-07-04 DIAGNOSIS — Z951 Presence of aortocoronary bypass graft: Secondary | ICD-10-CM | POA: Diagnosis not present

## 2015-07-06 DIAGNOSIS — Z951 Presence of aortocoronary bypass graft: Secondary | ICD-10-CM | POA: Diagnosis not present

## 2015-07-06 DIAGNOSIS — Z6826 Body mass index (BMI) 26.0-26.9, adult: Secondary | ICD-10-CM | POA: Diagnosis not present

## 2015-07-06 DIAGNOSIS — N390 Urinary tract infection, site not specified: Secondary | ICD-10-CM | POA: Diagnosis not present

## 2015-07-11 DIAGNOSIS — Z951 Presence of aortocoronary bypass graft: Secondary | ICD-10-CM | POA: Diagnosis not present

## 2015-07-12 DIAGNOSIS — I2510 Atherosclerotic heart disease of native coronary artery without angina pectoris: Secondary | ICD-10-CM | POA: Diagnosis not present

## 2015-07-12 DIAGNOSIS — Z6824 Body mass index (BMI) 24.0-24.9, adult: Secondary | ICD-10-CM | POA: Diagnosis not present

## 2015-07-12 DIAGNOSIS — Z951 Presence of aortocoronary bypass graft: Secondary | ICD-10-CM | POA: Diagnosis not present

## 2015-07-12 DIAGNOSIS — E785 Hyperlipidemia, unspecified: Secondary | ICD-10-CM | POA: Diagnosis not present

## 2015-07-13 DIAGNOSIS — Z951 Presence of aortocoronary bypass graft: Secondary | ICD-10-CM | POA: Diagnosis not present

## 2015-07-16 DIAGNOSIS — Z951 Presence of aortocoronary bypass graft: Secondary | ICD-10-CM | POA: Diagnosis not present

## 2015-07-18 DIAGNOSIS — Z951 Presence of aortocoronary bypass graft: Secondary | ICD-10-CM | POA: Diagnosis not present

## 2015-07-20 DIAGNOSIS — Z951 Presence of aortocoronary bypass graft: Secondary | ICD-10-CM | POA: Diagnosis not present

## 2015-07-23 DIAGNOSIS — Z951 Presence of aortocoronary bypass graft: Secondary | ICD-10-CM | POA: Diagnosis not present

## 2015-07-25 DIAGNOSIS — Z951 Presence of aortocoronary bypass graft: Secondary | ICD-10-CM | POA: Diagnosis not present

## 2015-07-27 DIAGNOSIS — Z951 Presence of aortocoronary bypass graft: Secondary | ICD-10-CM | POA: Diagnosis not present

## 2015-07-30 DIAGNOSIS — Z951 Presence of aortocoronary bypass graft: Secondary | ICD-10-CM | POA: Diagnosis not present

## 2015-08-01 DIAGNOSIS — Z951 Presence of aortocoronary bypass graft: Secondary | ICD-10-CM | POA: Diagnosis not present

## 2015-08-03 DIAGNOSIS — Z951 Presence of aortocoronary bypass graft: Secondary | ICD-10-CM | POA: Diagnosis not present

## 2015-08-06 DIAGNOSIS — Z951 Presence of aortocoronary bypass graft: Secondary | ICD-10-CM | POA: Diagnosis not present

## 2015-08-08 DIAGNOSIS — Z951 Presence of aortocoronary bypass graft: Secondary | ICD-10-CM | POA: Diagnosis not present

## 2015-08-10 DIAGNOSIS — Z951 Presence of aortocoronary bypass graft: Secondary | ICD-10-CM | POA: Diagnosis not present

## 2015-08-13 DIAGNOSIS — Z951 Presence of aortocoronary bypass graft: Secondary | ICD-10-CM | POA: Diagnosis not present

## 2015-08-15 DIAGNOSIS — Z951 Presence of aortocoronary bypass graft: Secondary | ICD-10-CM | POA: Diagnosis not present

## 2015-08-17 DIAGNOSIS — Z951 Presence of aortocoronary bypass graft: Secondary | ICD-10-CM | POA: Diagnosis not present

## 2015-08-20 DIAGNOSIS — Z951 Presence of aortocoronary bypass graft: Secondary | ICD-10-CM | POA: Diagnosis not present

## 2015-12-20 DIAGNOSIS — L820 Inflamed seborrheic keratosis: Secondary | ICD-10-CM | POA: Diagnosis not present

## 2015-12-20 DIAGNOSIS — L300 Nummular dermatitis: Secondary | ICD-10-CM | POA: Diagnosis not present

## 2015-12-20 DIAGNOSIS — L299 Pruritus, unspecified: Secondary | ICD-10-CM | POA: Diagnosis not present

## 2015-12-31 DIAGNOSIS — I10 Essential (primary) hypertension: Secondary | ICD-10-CM | POA: Diagnosis not present

## 2015-12-31 DIAGNOSIS — E890 Postprocedural hypothyroidism: Secondary | ICD-10-CM | POA: Diagnosis not present

## 2015-12-31 DIAGNOSIS — E559 Vitamin D deficiency, unspecified: Secondary | ICD-10-CM | POA: Diagnosis not present

## 2015-12-31 DIAGNOSIS — I2510 Atherosclerotic heart disease of native coronary artery without angina pectoris: Secondary | ICD-10-CM | POA: Diagnosis not present

## 2015-12-31 DIAGNOSIS — Z9181 History of falling: Secondary | ICD-10-CM | POA: Diagnosis not present

## 2015-12-31 DIAGNOSIS — Z23 Encounter for immunization: Secondary | ICD-10-CM | POA: Diagnosis not present

## 2015-12-31 DIAGNOSIS — Z1389 Encounter for screening for other disorder: Secondary | ICD-10-CM | POA: Diagnosis not present

## 2015-12-31 DIAGNOSIS — E785 Hyperlipidemia, unspecified: Secondary | ICD-10-CM | POA: Diagnosis not present

## 2016-01-09 DIAGNOSIS — I2510 Atherosclerotic heart disease of native coronary artery without angina pectoris: Secondary | ICD-10-CM | POA: Diagnosis not present

## 2016-01-09 DIAGNOSIS — Z6824 Body mass index (BMI) 24.0-24.9, adult: Secondary | ICD-10-CM | POA: Diagnosis not present

## 2016-01-09 DIAGNOSIS — E785 Hyperlipidemia, unspecified: Secondary | ICD-10-CM | POA: Diagnosis not present

## 2016-01-09 DIAGNOSIS — G8918 Other acute postprocedural pain: Secondary | ICD-10-CM | POA: Diagnosis not present

## 2016-01-09 DIAGNOSIS — Z951 Presence of aortocoronary bypass graft: Secondary | ICD-10-CM | POA: Diagnosis not present

## 2016-01-09 DIAGNOSIS — I447 Left bundle-branch block, unspecified: Secondary | ICD-10-CM | POA: Diagnosis not present

## 2016-01-09 DIAGNOSIS — R0789 Other chest pain: Secondary | ICD-10-CM | POA: Diagnosis not present

## 2016-02-01 DIAGNOSIS — E890 Postprocedural hypothyroidism: Secondary | ICD-10-CM | POA: Diagnosis not present

## 2016-02-01 DIAGNOSIS — Z6824 Body mass index (BMI) 24.0-24.9, adult: Secondary | ICD-10-CM | POA: Diagnosis not present

## 2016-04-10 DIAGNOSIS — Z9181 History of falling: Secondary | ICD-10-CM | POA: Diagnosis not present

## 2016-04-10 DIAGNOSIS — Z1389 Encounter for screening for other disorder: Secondary | ICD-10-CM | POA: Diagnosis not present

## 2016-04-10 DIAGNOSIS — Z1231 Encounter for screening mammogram for malignant neoplasm of breast: Secondary | ICD-10-CM | POA: Diagnosis not present

## 2016-04-10 DIAGNOSIS — Z136 Encounter for screening for cardiovascular disorders: Secondary | ICD-10-CM | POA: Diagnosis not present

## 2016-04-10 DIAGNOSIS — Z0000 Encounter for general adult medical examination without abnormal findings: Secondary | ICD-10-CM | POA: Diagnosis not present

## 2016-04-12 DIAGNOSIS — N390 Urinary tract infection, site not specified: Secondary | ICD-10-CM | POA: Diagnosis not present

## 2016-05-01 DIAGNOSIS — E890 Postprocedural hypothyroidism: Secondary | ICD-10-CM | POA: Diagnosis not present

## 2016-05-01 DIAGNOSIS — I2510 Atherosclerotic heart disease of native coronary artery without angina pectoris: Secondary | ICD-10-CM | POA: Diagnosis not present

## 2016-05-01 DIAGNOSIS — I10 Essential (primary) hypertension: Secondary | ICD-10-CM | POA: Diagnosis not present

## 2016-05-01 DIAGNOSIS — E559 Vitamin D deficiency, unspecified: Secondary | ICD-10-CM | POA: Diagnosis not present

## 2016-05-01 DIAGNOSIS — E785 Hyperlipidemia, unspecified: Secondary | ICD-10-CM | POA: Diagnosis not present

## 2016-05-12 DIAGNOSIS — N959 Unspecified menopausal and perimenopausal disorder: Secondary | ICD-10-CM | POA: Diagnosis not present

## 2016-05-12 DIAGNOSIS — K5792 Diverticulitis of intestine, part unspecified, without perforation or abscess without bleeding: Secondary | ICD-10-CM | POA: Diagnosis not present

## 2016-05-12 DIAGNOSIS — Z1231 Encounter for screening mammogram for malignant neoplasm of breast: Secondary | ICD-10-CM | POA: Diagnosis not present

## 2016-05-12 DIAGNOSIS — Z6826 Body mass index (BMI) 26.0-26.9, adult: Secondary | ICD-10-CM | POA: Diagnosis not present

## 2016-05-12 DIAGNOSIS — E785 Hyperlipidemia, unspecified: Secondary | ICD-10-CM | POA: Diagnosis not present

## 2016-08-04 DIAGNOSIS — E559 Vitamin D deficiency, unspecified: Secondary | ICD-10-CM | POA: Diagnosis not present

## 2016-08-04 DIAGNOSIS — I2510 Atherosclerotic heart disease of native coronary artery without angina pectoris: Secondary | ICD-10-CM | POA: Diagnosis not present

## 2016-08-04 DIAGNOSIS — E890 Postprocedural hypothyroidism: Secondary | ICD-10-CM | POA: Diagnosis not present

## 2016-08-04 DIAGNOSIS — E785 Hyperlipidemia, unspecified: Secondary | ICD-10-CM | POA: Diagnosis not present

## 2016-08-04 DIAGNOSIS — I10 Essential (primary) hypertension: Secondary | ICD-10-CM | POA: Diagnosis not present

## 2016-10-09 DIAGNOSIS — Z961 Presence of intraocular lens: Secondary | ICD-10-CM | POA: Diagnosis not present

## 2016-10-09 DIAGNOSIS — H5213 Myopia, bilateral: Secondary | ICD-10-CM | POA: Diagnosis not present

## 2016-10-09 DIAGNOSIS — H524 Presbyopia: Secondary | ICD-10-CM | POA: Diagnosis not present

## 2016-10-09 DIAGNOSIS — H04123 Dry eye syndrome of bilateral lacrimal glands: Secondary | ICD-10-CM | POA: Diagnosis not present

## 2016-10-13 DIAGNOSIS — R1031 Right lower quadrant pain: Secondary | ICD-10-CM | POA: Diagnosis not present

## 2016-10-13 DIAGNOSIS — Z6826 Body mass index (BMI) 26.0-26.9, adult: Secondary | ICD-10-CM | POA: Diagnosis not present

## 2016-10-15 DIAGNOSIS — K219 Gastro-esophageal reflux disease without esophagitis: Secondary | ICD-10-CM | POA: Diagnosis not present

## 2016-10-15 DIAGNOSIS — K6389 Other specified diseases of intestine: Secondary | ICD-10-CM | POA: Diagnosis not present

## 2016-10-15 DIAGNOSIS — R1031 Right lower quadrant pain: Secondary | ICD-10-CM | POA: Diagnosis not present

## 2016-10-15 DIAGNOSIS — K5730 Diverticulosis of large intestine without perforation or abscess without bleeding: Secondary | ICD-10-CM | POA: Diagnosis not present

## 2016-10-15 DIAGNOSIS — R1032 Left lower quadrant pain: Secondary | ICD-10-CM | POA: Diagnosis not present

## 2016-10-21 DIAGNOSIS — Z79899 Other long term (current) drug therapy: Secondary | ICD-10-CM | POA: Diagnosis not present

## 2016-10-21 DIAGNOSIS — D126 Benign neoplasm of colon, unspecified: Secondary | ICD-10-CM | POA: Diagnosis not present

## 2016-10-21 DIAGNOSIS — K5140 Inflammatory polyps of colon without complications: Secondary | ICD-10-CM | POA: Diagnosis not present

## 2016-10-21 DIAGNOSIS — R933 Abnormal findings on diagnostic imaging of other parts of digestive tract: Secondary | ICD-10-CM | POA: Diagnosis not present

## 2016-10-21 DIAGNOSIS — K5730 Diverticulosis of large intestine without perforation or abscess without bleeding: Secondary | ICD-10-CM | POA: Diagnosis not present

## 2016-10-21 DIAGNOSIS — R1011 Right upper quadrant pain: Secondary | ICD-10-CM | POA: Diagnosis not present

## 2016-10-21 DIAGNOSIS — Z7982 Long term (current) use of aspirin: Secondary | ICD-10-CM | POA: Diagnosis not present

## 2016-10-21 DIAGNOSIS — R935 Abnormal findings on diagnostic imaging of other abdominal regions, including retroperitoneum: Secondary | ICD-10-CM | POA: Diagnosis not present

## 2016-10-21 DIAGNOSIS — R1030 Lower abdominal pain, unspecified: Secondary | ICD-10-CM | POA: Diagnosis not present

## 2016-10-21 DIAGNOSIS — Q433 Congenital malformations of intestinal fixation: Secondary | ICD-10-CM | POA: Diagnosis not present

## 2016-10-21 DIAGNOSIS — I10 Essential (primary) hypertension: Secondary | ICD-10-CM | POA: Diagnosis not present

## 2016-11-05 DIAGNOSIS — E890 Postprocedural hypothyroidism: Secondary | ICD-10-CM | POA: Diagnosis not present

## 2016-11-05 DIAGNOSIS — E785 Hyperlipidemia, unspecified: Secondary | ICD-10-CM | POA: Diagnosis not present

## 2016-11-05 DIAGNOSIS — E559 Vitamin D deficiency, unspecified: Secondary | ICD-10-CM | POA: Diagnosis not present

## 2016-11-05 DIAGNOSIS — Z9181 History of falling: Secondary | ICD-10-CM | POA: Diagnosis not present

## 2016-11-05 DIAGNOSIS — I10 Essential (primary) hypertension: Secondary | ICD-10-CM | POA: Diagnosis not present

## 2016-11-05 DIAGNOSIS — I2510 Atherosclerotic heart disease of native coronary artery without angina pectoris: Secondary | ICD-10-CM | POA: Diagnosis not present

## 2016-11-05 DIAGNOSIS — Z1389 Encounter for screening for other disorder: Secondary | ICD-10-CM | POA: Diagnosis not present

## 2016-11-17 DIAGNOSIS — R933 Abnormal findings on diagnostic imaging of other parts of digestive tract: Secondary | ICD-10-CM | POA: Diagnosis not present

## 2016-12-24 DIAGNOSIS — Z23 Encounter for immunization: Secondary | ICD-10-CM | POA: Diagnosis not present

## 2017-02-03 ENCOUNTER — Ambulatory Visit: Payer: Medicare Other | Admitting: Cardiology

## 2017-02-06 DIAGNOSIS — I10 Essential (primary) hypertension: Secondary | ICD-10-CM | POA: Diagnosis not present

## 2017-02-06 DIAGNOSIS — E559 Vitamin D deficiency, unspecified: Secondary | ICD-10-CM | POA: Diagnosis not present

## 2017-02-06 DIAGNOSIS — E890 Postprocedural hypothyroidism: Secondary | ICD-10-CM | POA: Diagnosis not present

## 2017-02-06 DIAGNOSIS — E785 Hyperlipidemia, unspecified: Secondary | ICD-10-CM | POA: Diagnosis not present

## 2017-02-06 DIAGNOSIS — I2510 Atherosclerotic heart disease of native coronary artery without angina pectoris: Secondary | ICD-10-CM | POA: Diagnosis not present

## 2017-02-06 DIAGNOSIS — N8110 Cystocele, unspecified: Secondary | ICD-10-CM | POA: Diagnosis not present

## 2017-02-23 DIAGNOSIS — J208 Acute bronchitis due to other specified organisms: Secondary | ICD-10-CM | POA: Diagnosis not present

## 2017-02-24 HISTORY — PX: ABDOMINAL HYSTERECTOMY: SHX81

## 2017-03-04 ENCOUNTER — Other Ambulatory Visit: Payer: Self-pay | Admitting: *Deleted

## 2017-03-04 ENCOUNTER — Encounter: Payer: Self-pay | Admitting: *Deleted

## 2017-03-04 DIAGNOSIS — I447 Left bundle-branch block, unspecified: Secondary | ICD-10-CM

## 2017-03-04 HISTORY — DX: Left bundle-branch block, unspecified: I44.7

## 2017-03-10 ENCOUNTER — Encounter: Payer: Self-pay | Admitting: Cardiology

## 2017-03-10 ENCOUNTER — Other Ambulatory Visit: Payer: Self-pay

## 2017-03-10 ENCOUNTER — Ambulatory Visit (INDEPENDENT_AMBULATORY_CARE_PROVIDER_SITE_OTHER): Payer: Medicare Other | Admitting: Cardiology

## 2017-03-10 VITALS — BP 168/78 | HR 78 | Ht 62.0 in | Wt 141.0 lb

## 2017-03-10 DIAGNOSIS — Z951 Presence of aortocoronary bypass graft: Secondary | ICD-10-CM | POA: Diagnosis not present

## 2017-03-10 DIAGNOSIS — E782 Mixed hyperlipidemia: Secondary | ICD-10-CM

## 2017-03-10 DIAGNOSIS — E785 Hyperlipidemia, unspecified: Secondary | ICD-10-CM

## 2017-03-10 DIAGNOSIS — I2510 Atherosclerotic heart disease of native coronary artery without angina pectoris: Secondary | ICD-10-CM

## 2017-03-10 HISTORY — DX: Hyperlipidemia, unspecified: E78.5

## 2017-03-10 MED ORDER — NITROGLYCERIN 0.4 MG SL SUBL: 0.4000 mg | SUBLINGUAL_TABLET | SUBLINGUAL | 1 refills | Status: DC | PRN

## 2017-03-10 NOTE — Progress Notes (Signed)
Cardiology Office Note:    Date:  03/10/2017   ID:  Brittany Santiago, DOB February 12, 1931, MRN [id number]  PCP:  Nicoletta Dress, MD  Cardiologist:  Jenean Lindau, MD   Referring MD: Nicoletta Dress, MD    ASSESSMENT:    1. Dyslipidemia   2. Coronary artery disease involving native coronary artery of native heart without angina pectoris   3. S/P CABG x 2   4. Mixed hyperlipidemia    PLAN:    In order of problems listed above:  1. I discussed my findings with the patient extensively and secondary prevention stressed.  Regular exercise stressed and she vocalized understanding.  Blood pressure is elevated and she mentions that generally runs in the 782N systolic and 56O diastolic. 2. She will now begin a regular walking program.  She denies any chest pain.  Sublingual nitroglycerin prescription was sent, its protocol and 911 protocol explained and the patient vocalized understanding questions were answered to the patient's satisfaction.  Her lipids are followed by her primary care physician and she will try to get [place] a copy from her primary care doctor in the next visit. 3. Patient will be seen in follow-up appointment in 6 months or earlier if the patient has any concerns    Medication Adjustments/Labs and Tests Ordered: Current medicines are reviewed at length with the patient today.  Concerns regarding medicines are outlined above.  Orders Placed This Encounter  Procedures  . EKG 12-Lead   Meds ordered this encounter  Medications  . DISCONTD: nitroGLYCERIN (NITROSTAT) 0.4 MG SL tablet    Sig: Place 1 tablet (0.4 mg total) under the tongue every 5 (five) minutes as needed for chest pain.    Dispense:  90 tablet    Refill:  1  . nitroGLYCERIN (NITROSTAT) 0.4 MG SL tablet    Sig: Place 1 tablet (0.4 mg total) under the tongue every 5 (five) minutes as needed for chest pain.    Dispense:  90 tablet    Refill:  1     History of Present Illness:    Brittany Santiago is a 84  y.o. female who is being seen today for the evaluation of coronary artery disease and get established with me at the request of Nicoletta Dress, MD.  Patient is a pleasant 84 year old female.  She has past medical history of coronary artery disease and CABG about 2-3 years ago.  She has history of essential hypertension, dyslipidemia.  She mentions to me that she leads a sedentary lifestyle.  She ambulates age appropriately but does not exercise on a regular basis.  She recently has had blood work done by her primary care physician and she has been told that her blood work was fine.  She denies any chest pain orthopnea or PND.  At the time of my evaluation, the patient is alert awake oriented and in no distress.  Again she wants to be established with me.  Past Medical History:  Diagnosis Date  . Asthma, moderate persistent 08/02/2013   6/9/2015[facility]: normal CXR normal    . Coronary atherosclerosis of native coronary artery 03/16/2015  . Hyperlipidemia   . Hypertension   . Left bundle branch block (LBBB) 03/04/2017  . 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 Seasonal allergies   . Thyroid disease     Past Surgical History:  Procedure Laterality Date  . CESAREAN SECTION    . JOINT REPLACEMENT Right    hip  . neck back  surgery  2009  . THYROID SURGERY      Current Medications: Current Meds  Medication Sig  . ALPRAZolam (XANAX) 0.25 MG tablet Take 1 tablet by mouth as needed.   Marland Kitchen amLODipine (NORVASC) 5 MG tablet Take 5 mg by mouth daily.  Marland Kitchen aspirin 81 MG tablet Take 81 mg by mouth daily.  Marland Kitchen atorvastatin (LIPITOR) 80 MG tablet Take 80 mg by mouth.  . Cholecalciferol (VITAMIN D-3) 5000 UNITS TABS Take by mouth. Take 1 tablet daily by mouth  . levothyroxine (SYNTHROID, LEVOTHROID) 88 MCG tablet Take by mouth.  . metoprolol tartrate (LOPRESSOR) 25 MG tablet Take 12.5 mg by mouth.  . Multiple Vitamins-Calcium (CALCI-MAX PO) Take by mouth daily.   Marland Kitchen omeprazole (PRILOSEC) 20 MG capsule Take 20 mg by mouth  daily.     Allergies:   Prednisone   Social History   Socioeconomic History  . Marital status: Married    Spouse name: None  . Number of children: None  . Years of education: None  . Highest education level: None  Social Needs  . Financial resource strain: None  . Food insecurity - worry: None  . Food insecurity - inability: None  . Transportation needs - medical: None  . Transportation needs - non-medical: None  Occupational History  . None  Tobacco Use  . Smoking status: Never Smoker  . Smokeless tobacco: Never Used  Substance and Sexual Activity  . Alcohol use: No  . Drug use: No  . Sexual activity: None  Other Topics Concern  . None  Social History Narrative   Lives alone,retired.   Husband deceased     Family History: The patient's family history includes Depression in her father; Heart failure in her mother.  ROS:   Please see the history of present illness.    All other systems reviewed and are negative.  EKGs/Labs/Other Studies Reviewed:    The following studies were reviewed today: I reviewed records and discussed the findings with her.  EKG done today reveals sinus rhythm, poor anterior forces suggesting of possible anterior infarct in the past T wave inversions in the inferior leads, nonspecific intraventricular conduction delay and nonspecific ST-T changes.   Recent Labs: No results found for requested labs within last 8760 hours.  Recent Lipid Panel No results found for: CHOL, TRIG, HDL, CHOLHDL, VLDL, LDLCALC, LDLDIRECT  Physical Exam:    VS:  BP (!) 168/78 (BP Location: Right Arm, Patient Position: Sitting, Cuff Size: Normal)   Pulse 78   Ht 5\' 2"  (1.575 m)   Wt 141 lb (64 kg)   SpO2 98%   BMI 25.79 kg/m     Wt Readings from Last 3 Encounters:  03/10/17 141 lb (64 kg)  03/21/14 142 lb 12.8 oz (64.8 kg)  10/04/13 142 lb 9.6 oz (64.7 kg)     GEN: Patient is in no acute distress HEENT: Normal NECK: No JVD; No carotid  bruits LYMPHATICS: No lymphadenopathy CARDIAC: S1 S2 regular, 2/6 systolic murmur at the apex. RESPIRATORY:  Clear to auscultation without rales, wheezing or rhonchi  ABDOMEN: Soft, non-tender, non-distended MUSCULOSKELETAL:  No edema; No deformity  SKIN: Warm and dry NEUROLOGIC:  Alert and oriented x 3 PSYCHIATRIC:  Normal affect    Signed, Jenean Lindau, MD  03/10/2017 12:02 PM    [place]

## 2017-03-10 NOTE — Patient Instructions (Addendum)
Medication Instructions:  START Nitroglycerin 0.4 mg sublingual (under your tongue) as needed for chest pain. If experiencing chest pain, stop what you are doing and sit down. Take 1 nitroglycerin and wait 5 minutes. If chest pain continues, take another nitroglycerin and wait 5 minutes. If chest pain does not subside, take 1 more nitroglycerin and dial 911. You make take a total of 3 nitroglycerin in a 15 minute time frame.  Labwork: None  Testing/Procedures: None  Follow-Up: Your physician recommends that you schedule a follow-up appointment in: 6 months  Any Other Special Instructions Will Be Listed Below (If Applicable).     If you need a refill on your cardiac medications before your next appointment, please call your pharmacy.   Watkins, RN, BSN   Nitroglycerin sublingual tablets What is this medicine? NITROGLYCERIN (nye troe GLI ser in) is a type of vasodilator. It relaxes blood vessels, increasing the blood and oxygen supply to your heart. This medicine is used to relieve chest pain caused by angina. It is also used to prevent chest pain before activities like climbing stairs, going outdoors in cold weather, or sexual activity. This medicine may be used for other purposes; ask your health care provider or pharmacist if you have questions. COMMON BRAND NAME(S): Nitroquick, Nitrostat, Nitrotab What should I tell my health care provider before I take this medicine? They need to know if you have any of these conditions: -anemia -head injury, recent stroke, or bleeding in the brain -liver disease -previous heart attack -an unusual or allergic reaction to nitroglycerin, other medicines, foods, dyes, or preservatives -pregnant or trying to get pregnant -breast-feeding How should I use this medicine? Take this medicine by mouth as needed. At the first sign of an angina attack (chest pain or tightness) place one tablet under your tongue. You can also take this  medicine 5 to 10 minutes before an event likely to produce chest pain. Follow the directions on the prescription label. Let the tablet dissolve under the tongue. Do not swallow whole. Replace the dose if you accidentally swallow it. It will help if your mouth is not dry. Saliva around the tablet will help it to dissolve more quickly. Do not eat or drink, smoke or chew tobacco while a tablet is dissolving. If you are not better within 5 minutes after taking ONE dose of nitroglycerin, call 9-1-1 immediately to seek emergency medical care. Do not take more than 3 nitroglycerin tablets over 15 minutes. If you take this medicine often to relieve symptoms of angina, your doctor or health care professional may provide you with different instructions to manage your symptoms. If symptoms do not go away after following these instructions, it is important to call 9-1-1 immediately. Do not take more than 3 nitroglycerin tablets over 15 minutes. Talk to your pediatrician regarding the use of this medicine in children. Special care may be needed. Overdosage: If you think you have taken too much of this medicine contact a poison control center or emergency room at once. NOTE: This medicine is only for you. Do not share this medicine with others. What if I miss a dose? This does not apply. This medicine is only used as needed. What may interact with this medicine? Do not take this medicine with any of the following medications: -certain migraine medicines like ergotamine and dihydroergotamine (DHE) -medicines used to treat erectile dysfunction like sildenafil, tadalafil, and vardenafil -riociguat This medicine may also interact with the following medications: -alteplase -aspirin -  heparin -medicines for high blood pressure -medicines for mental depression -other medicines used to treat angina -phenothiazines like chlorpromazine, mesoridazine, prochlorperazine, thioridazine This list may not describe all possible  interactions. Give your health care provider a list of all the medicines, herbs, non-prescription drugs, or dietary supplements you use. Also tell them if you smoke, drink alcohol, or use illegal drugs. Some items may interact with your medicine. What should I watch for while using this medicine? Tell your doctor or health care professional if you feel your medicine is no longer working. Keep this medicine with you at all times. Sit or lie down when you take your medicine to prevent falling if you feel dizzy or faint after using it. Try to remain calm. This will help you to feel better faster. If you feel dizzy, take several deep breaths and lie down with your feet propped up, or bend forward with your head resting between your knees. You may get drowsy or dizzy. Do not drive, use machinery, or do anything that needs mental alertness until you know how this drug affects you. Do not stand or sit up quickly, especially if you are an older patient. This reduces the risk of dizzy or fainting spells. Alcohol can make you more drowsy and dizzy. Avoid alcoholic drinks. Do not treat yourself for coughs, colds, or pain while you are taking this medicine without asking your doctor or health care professional for advice. Some ingredients may increase your blood pressure. What side effects may I notice from receiving this medicine? Side effects that you should report to your doctor or health care professional as soon as possible: -blurred vision -dry mouth -skin rash -sweating -the feeling of extreme pressure in the head -unusually weak or tired Side effects that usually do not require medical attention (report to your doctor or health care professional if they continue or are bothersome): -flushing of the face or neck -headache -irregular heartbeat, palpitations -nausea, vomiting This list may not describe all possible side effects. Call your doctor for medical advice about side effects. You may report side  effects to FDA at 1-800-FDA-1088. Where should I keep my medicine? Keep out of the reach of children. Store at room temperature between 20 and 25 degrees C (68 and 77 degrees F). Store in Chief of Staff. Protect from light and moisture. Keep tightly closed. Throw away any unused medicine after the expiration date. NOTE: This sheet is a summary. It may not cover all possible information. If you have questions about this medicine, talk to your doctor, pharmacist, or health care provider.  2018 Elsevier/Gold Standard (2012-12-09 17:57:36)

## 2017-04-09 DIAGNOSIS — N959 Unspecified menopausal and perimenopausal disorder: Secondary | ICD-10-CM | POA: Diagnosis not present

## 2017-04-09 DIAGNOSIS — E785 Hyperlipidemia, unspecified: Secondary | ICD-10-CM | POA: Diagnosis not present

## 2017-04-09 DIAGNOSIS — Z9181 History of falling: Secondary | ICD-10-CM | POA: Diagnosis not present

## 2017-04-09 DIAGNOSIS — Z0000 Encounter for general adult medical examination without abnormal findings: Secondary | ICD-10-CM | POA: Diagnosis not present

## 2017-04-09 DIAGNOSIS — Z1331 Encounter for screening for depression: Secondary | ICD-10-CM | POA: Diagnosis not present

## 2017-04-09 DIAGNOSIS — Z136 Encounter for screening for cardiovascular disorders: Secondary | ICD-10-CM | POA: Diagnosis not present

## 2017-04-10 DIAGNOSIS — N3941 Urge incontinence: Secondary | ICD-10-CM | POA: Diagnosis not present

## 2017-04-10 DIAGNOSIS — R351 Nocturia: Secondary | ICD-10-CM | POA: Diagnosis not present

## 2017-04-10 DIAGNOSIS — R350 Frequency of micturition: Secondary | ICD-10-CM | POA: Diagnosis not present

## 2017-05-12 DIAGNOSIS — C541 Malignant neoplasm of endometrium: Secondary | ICD-10-CM | POA: Diagnosis not present

## 2017-05-12 DIAGNOSIS — R1032 Left lower quadrant pain: Secondary | ICD-10-CM | POA: Diagnosis not present

## 2017-05-12 DIAGNOSIS — N950 Postmenopausal bleeding: Secondary | ICD-10-CM | POA: Diagnosis not present

## 2017-05-15 DIAGNOSIS — I2510 Atherosclerotic heart disease of native coronary artery without angina pectoris: Secondary | ICD-10-CM | POA: Diagnosis not present

## 2017-05-15 DIAGNOSIS — I10 Essential (primary) hypertension: Secondary | ICD-10-CM | POA: Diagnosis not present

## 2017-05-15 DIAGNOSIS — Z6826 Body mass index (BMI) 26.0-26.9, adult: Secondary | ICD-10-CM | POA: Diagnosis not present

## 2017-05-15 DIAGNOSIS — E890 Postprocedural hypothyroidism: Secondary | ICD-10-CM | POA: Diagnosis not present

## 2017-05-15 DIAGNOSIS — E559 Vitamin D deficiency, unspecified: Secondary | ICD-10-CM | POA: Diagnosis not present

## 2017-05-15 DIAGNOSIS — E785 Hyperlipidemia, unspecified: Secondary | ICD-10-CM | POA: Diagnosis not present

## 2017-05-19 DIAGNOSIS — N816 Rectocele: Secondary | ICD-10-CM | POA: Diagnosis not present

## 2017-05-19 DIAGNOSIS — N905 Atrophy of vulva: Secondary | ICD-10-CM | POA: Diagnosis not present

## 2017-05-19 DIAGNOSIS — N8110 Cystocele, unspecified: Secondary | ICD-10-CM | POA: Diagnosis not present

## 2017-05-19 DIAGNOSIS — C541 Malignant neoplasm of endometrium: Secondary | ICD-10-CM | POA: Diagnosis not present

## 2017-05-19 DIAGNOSIS — Z1231 Encounter for screening mammogram for malignant neoplasm of breast: Secondary | ICD-10-CM | POA: Diagnosis not present

## 2017-05-20 ENCOUNTER — Telehealth: Payer: Self-pay | Admitting: *Deleted

## 2017-05-20 NOTE — Telephone Encounter (Signed)
Called and left a message on the machine for the patient to call the office back. Patient needs to be given the appt for April 3rd

## 2017-05-20 NOTE — Telephone Encounter (Signed)
Patient called back and was given the appt for April 3rd

## 2017-05-27 ENCOUNTER — Inpatient Hospital Stay: Payer: Medicare Other | Attending: Gynecologic Oncology | Admitting: Gynecologic Oncology

## 2017-05-27 ENCOUNTER — Encounter: Payer: Self-pay | Admitting: Gynecologic Oncology

## 2017-05-27 VITALS — BP 150/76 | HR 78 | Temp 97.3°F | Resp 20 | Ht 63.0 in | Wt 141.5 lb

## 2017-05-27 DIAGNOSIS — C541 Malignant neoplasm of endometrium: Secondary | ICD-10-CM | POA: Insufficient documentation

## 2017-05-27 HISTORY — DX: Malignant neoplasm of endometrium: C54.1

## 2017-05-27 NOTE — H&P (View-Only) (Signed)
Consult Note: Gyn-Onc  Consult was requested by Dr. Stann Mainland for the evaluation of Brittany Santiago 84 y.o. female  CC:  Chief Complaint  Patient presents with  . Endometrial cancer [facility])    Assessment/Plan:  Brittany Santiago  is a 84 y.o.  year old with grade 1 endometrial cancer in the setting of a prior history of CABG.  A detailed discussion was held with the patient and her family with regard to to her endometrial cancer diagnosis. We discussed the standard management options for uterine cancer which includes surgery followed possibly by adjuvant therapy depending on the results of surgery. The options for surgical management include a hysterectomy and removal of the tubes and ovaries possibly with removal of pelvic and para-aortic lymph nodes.If feasible, a minimally invasive approach including a robotic hysterectomy or laparoscopic hysterectomy have benefits including shorter hospital stay, recovery time and better wound healing than with open surgery. The patient has been counseled about these surgical options and the risks of surgery in general including infection, bleeding, damage to surrounding structures (including bowel, bladder, ureters, nerves or vessels), and the postoperative risks of PE/ DVT, and lymphedema. I extensively reviewed the additional risks of robotic hysterectomy including possible need for conversion to open laparotomy.  I discussed positioning during surgery of trendelenberg and risks of minor facial swelling and care we take in preoperative positioning.  After counseling and consideration of her options, she desires to proceed with robotic assisted total hysterectomy with bilateral sapingo-oophorectomy and SLN biopsy.   She will be seen by anesthesia for preoperative clearance and discussion of postoperative pain management.  She was given the opportunity to ask questions, which were answered to her satisfaction, and she is agreement with the above mentioned plan of  care.  We will reach out to her cardiologist to confirm that she is cleared for surgery. She will stay on her 81mg  ASA perioperatively. I discussed that this is associated with an increased risk for perioperative bleeding.  She has no concerning cardiac symptoms and excellent METS.   HPI: Brittany Santiago is a 84 year old P2 who is seen in consultation at the request of Dr Stann Mainland for grade 1 endometrial cancer.   The patient reports 3 episodes of vaginal bleeding in the month of March 2019.  She was seen by Dr. Alfonse Spruce for evaluation for this.  And an ultrasound was performed in March 2019.  This revealed a uterus measuring 7.8 x 4.8 x 4.1 cm with a thickened endometrial thickness of 26 mm.  The ovaries appeared within normal limits and no free fluid was seen.  Due to endometrial thickness she underwent an endometrial [place] biopsy on 05/12/2017.  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  The patient is otherwise fairly healthy.  She does however have a history of a coronary artery bypass graft surgery with 2 vessel blockage in 2017.  This was performed at [facility].  Her cardiologist is Dr. Tish Frederickson.  Since that procedure she has done extremely well.  She can climb a flight of stairs without chest pain or shortness of breath.  She has no edema.  She has no dyspnea.  She can lay flat to sleep.  She was recently seen by her cardiologist in January 2019 with a clean bill of health.  She takes 81 mg of aspirin.  She also takes a beta-blocker.  Her surgical history abdominally  is significant for 2 prior cesarean sections.  She has had urinary incontinence treated with 3 vaginal procedures including 2 injections in 1 pubovaginal sling.  She continues to have stress urinary incontinence.  Her family history significant for a brother with kidney cancer in his 8s and another brother with leukemia in his 61s.   Her father had prostate cancer.  Current Meds:  Outpatient Encounter Medications as of 05/27/2017  Medication Sig  . amLODipine (NORVASC) 5 MG tablet Take 5 mg by mouth daily.  Marland Kitchen aspirin 81 MG tablet Take 81 mg by mouth daily.  Marland Kitchen atorvastatin (LIPITOR) 80 MG tablet Take 80 mg by mouth.  Marland Kitchen azithromycin (ZITHROMAX) 500 MG tablet azithromycin 500 mg tablet  . Cholecalciferol (VITAMIN D-3) 5000 UNITS TABS Take by mouth. Take 1 tablet daily by mouth  . levothyroxine (SYNTHROID, LEVOTHROID) 88 MCG tablet Take by mouth.  . Multiple Vitamins-Calcium (CALCI-MAX PO) Take by mouth daily.   Marland Kitchen omeprazole (PRILOSEC) 20 MG capsule Take 20 mg by mouth daily.  Marland Kitchen albuterol (PROVENTIL HFA;VENTOLIN HFA) 108 (90 BASE) MCG/ACT inhaler Inhale 2 puffs into the lungs every 6 (six) hours as needed for wheezing or shortness of breath.  . ALPRAZolam (XANAX) 0.25 MG tablet Take 1 tablet by mouth as needed.   . beclomethasone (QVAR) 80 MCG/ACT inhaler Inhale 2 puffs into the lungs daily. (Patient not taking: Reported on 03/10/2017)  . ezetimibe (ZETIA) 10 MG tablet Take 1 tablet by mouth daily.  . metoprolol tartrate (LOPRESSOR) 25 MG tablet Take 12.5 mg by mouth.  . nitroGLYCERIN (NITROSTAT) 0.4 MG SL tablet Place 1 tablet (0.4 mg total) under the tongue every 5 (five) minutes as needed for chest pain. (Patient not taking: Reported on 05/27/2017)  . promethazine-codeine (PHENERGAN WITH CODEINE) 6.25-10 MG/5ML syrup as needed.   No facility-administered encounter medications on file as of 05/27/2017.     Allergy:  Allergies  Allergen Reactions  . Prednisone     Social Hx:   Social History   Socioeconomic History  . Marital status: Married    Spouse name: Not on file  . Number of children: Not on file  . Years of education: Not on file  . Highest education level: Not on file  Occupational History  . Not on file  Social Needs  . Financial resource strain: Not on file  . Food insecurity:    Worry: Not on file     Inability: Not on file  . Transportation needs:    Medical: Not on file    Non-medical: Not on file  Tobacco Use  . Smoking status: Never Smoker  . Smokeless tobacco: Never Used  Substance and Sexual Activity  . Alcohol use: No  . Drug use: No  . Sexual activity: Not on file  Lifestyle  . Physical activity:    Days per week: Not on file    Minutes per session: Not on file  . Stress: Not on file  Relationships  . Social connections:    Talks on phone: Not on file    Gets together: Not on file    Attends religious service: Not on file    Active member of club or organization: Not on file    Attends meetings of clubs or organizations: Not on file    Relationship status: Not on file  . Intimate partner violence:    Fear of current or ex partner: Not on file    Emotionally abused: Not on file    Physically abused:  Not on file    Forced sexual activity: Not on file  Other Topics Concern  . Not on file  Social History Narrative   Lives alone,retired.   Husband deceased    Past Surgical Hx:  Past Surgical History:  Procedure Laterality Date  . CESAREAN SECTION    . JOINT REPLACEMENT Right    hip  . neck back surgery  2009  . THYROID SURGERY      Past Medical Hx:  Past Medical History:  Diagnosis Date  . Asthma, moderate persistent 08/02/2013   6/9/2015[facility]: normal CXR normal    . Coronary atherosclerosis of native coronary artery 03/16/2015  . Hyperlipidemia   . Hypertension   . Left bundle branch block (LBBB) 03/04/2017  . 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 Seasonal allergies   . Thyroid disease     Past Gynecological History:  C/s x 2 No LMP recorded. Patient is postmenopausal.  Family Hx:  Family History  Problem Relation Age of Onset  . Heart failure Mother   . Depression Father        commited suicide  . Kidney cancer Brother   . Leukemia Brother     Review of Systems:  Constitutional  Feels well,    ENT Normal appearing ears and nares  bilaterally Skin/Breast  No rash, sores, jaundice, itching, dryness Cardiovascular  No chest pain, shortness of breath, or edema  Pulmonary  No cough or wheeze.  Gastro Intestinal  No nausea, vomitting, or diarrhoea. No bright red blood per rectum, no abdominal pain, change in bowel movement, or constipation.  Genito Urinary  No frequency, urgency, dysuria, + postmenopausal bleeding Musculo Skeletal  No myalgia, arthralgia, joint swelling or pain  Neurologic  No weakness, numbness, change in gait,  Psychology  No depression, anxiety, insomnia.   Vitals:  Blood pressure (!) 150/76, pulse 78, temperature (!) 97.3 F (36.3 C), temperature source Oral, resp. rate 20, height 5\' 3"  (1.6 m), weight 141 lb 8 oz (64.2 kg), SpO2 96 %.  Physical Exam: WD in NAD Neck  Supple NROM, without any enlargements.  Lymph Node Survey No cervical supraclavicular or inguinal adenopathy Cardiovascular  Pulse normal rate, regularity and rhythm. S1 and S2 normal.  Lungs  Clear to auscultation bilateraly, without wheezes/crackles/rhonchi. Good air movement.  Skin  No rash/lesions/breakdown  Psychiatry  Alert and oriented to person, place, and time  Abdomen  Normoactive bowel sounds, abdomen soft, non-tender and obese without evidence of hernia.  Back No CVA tenderness Genito Urinary  Vulva/vagina: Normal external female genitalia.   No lesions. No discharge or bleeding.  Bladder/urethra:  No lesions or masses, prolapsed bladder  Vagina: atrophic, normal, some prolapse  Cervix: Normal appearing, no lesions.  Uterus:  Small, mobile, no parametrial involvement or nodularity.  Adnexa: no palpable masses. Rectal  deferred  Extremities  No bilateral cyanosis, clubbing or edema.   Thereasa Solo, MD  05/27/2017, 6:34 PM

## 2017-05-27 NOTE — Progress Notes (Signed)
Consult Note: Gyn-Onc  Consult was requested by Dr. Stann Mainland for the evaluation of Brittany Santiago 84 y.o. female  CC:  Chief Complaint  Patient presents with  . Endometrial cancer [facility])    Assessment/Plan:  Brittany Santiago  is a 84 y.o.  year old with grade 1 endometrial cancer in the setting of a prior history of CABG.  A detailed discussion was held with the patient and her family with regard to to her endometrial cancer diagnosis. We discussed the standard management options for uterine cancer which includes surgery followed possibly by adjuvant therapy depending on the results of surgery. The options for surgical management include a hysterectomy and removal of the tubes and ovaries possibly with removal of pelvic and para-aortic lymph nodes.If feasible, a minimally invasive approach including a robotic hysterectomy or laparoscopic hysterectomy have benefits including shorter hospital stay, recovery time and better wound healing than with open surgery. The patient has been counseled about these surgical options and the risks of surgery in general including infection, bleeding, damage to surrounding structures (including bowel, bladder, ureters, nerves or vessels), and the postoperative risks of PE/ DVT, and lymphedema. I extensively reviewed the additional risks of robotic hysterectomy including possible need for conversion to open laparotomy.  I discussed positioning during surgery of trendelenberg and risks of minor facial swelling and care we take in preoperative positioning.  After counseling and consideration of her options, she desires to proceed with robotic assisted total hysterectomy with bilateral sapingo-oophorectomy and SLN biopsy.   She will be seen by anesthesia for preoperative clearance and discussion of postoperative pain management.  She was given the opportunity to ask questions, which were answered to her satisfaction, and she is agreement with the above mentioned plan of  care.  We will reach out to her cardiologist to confirm that she is cleared for surgery. She will stay on her 81mg  ASA perioperatively. I discussed that this is associated with an increased risk for perioperative bleeding.  She has no concerning cardiac symptoms and excellent METS.   HPI: Brittany Santiago is a 84 year old P2 who is seen in consultation at the request of Dr Stann Mainland for grade 1 endometrial cancer.   The patient reports 3 episodes of vaginal bleeding in the month of March 2019.  She was seen by Dr. Alfonse Spruce for evaluation for this.  And an ultrasound was performed in March 2019.  This revealed a uterus measuring 7.8 x 4.8 x 4.1 cm with a thickened endometrial thickness of 26 mm.  The ovaries appeared within normal limits and no free fluid was seen.  Due to endometrial thickness she underwent an endometrial [place] biopsy on 05/12/2017.  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  The patient is otherwise fairly healthy.  She does however have a history of a coronary artery bypass graft surgery with 2 vessel blockage in 2017.  This was performed at [facility].  Her cardiologist is Dr. Tish Frederickson.  Since that procedure she has done extremely well.  She can climb a flight of stairs without chest pain or shortness of breath.  She has no edema.  She has no dyspnea.  She can lay flat to sleep.  She was recently seen by her cardiologist in January 2019 with a clean bill of health.  She takes 81 mg of aspirin.  She also takes a beta-blocker.  Her surgical history abdominally  is significant for 2 prior cesarean sections.  She has had urinary incontinence treated with 3 vaginal procedures including 2 injections in 1 pubovaginal sling.  She continues to have stress urinary incontinence.  Her family history significant for a brother with kidney cancer in his 49s and another brother with leukemia in his 34s.   Her father had prostate cancer.  Current Meds:  Outpatient Encounter Medications as of 05/27/2017  Medication Sig  . amLODipine (NORVASC) 5 MG tablet Take 5 mg by mouth daily.  Marland Kitchen aspirin 81 MG tablet Take 81 mg by mouth daily.  Marland Kitchen atorvastatin (LIPITOR) 80 MG tablet Take 80 mg by mouth.  Marland Kitchen azithromycin (ZITHROMAX) 500 MG tablet azithromycin 500 mg tablet  . Cholecalciferol (VITAMIN D-3) 5000 UNITS TABS Take by mouth. Take 1 tablet daily by mouth  . levothyroxine (SYNTHROID, LEVOTHROID) 88 MCG tablet Take by mouth.  . Multiple Vitamins-Calcium (CALCI-MAX PO) Take by mouth daily.   Marland Kitchen omeprazole (PRILOSEC) 20 MG capsule Take 20 mg by mouth daily.  Marland Kitchen albuterol (PROVENTIL HFA;VENTOLIN HFA) 108 (90 BASE) MCG/ACT inhaler Inhale 2 puffs into the lungs every 6 (six) hours as needed for wheezing or shortness of breath.  . ALPRAZolam (XANAX) 0.25 MG tablet Take 1 tablet by mouth as needed.   . beclomethasone (QVAR) 80 MCG/ACT inhaler Inhale 2 puffs into the lungs daily. (Patient not taking: Reported on 03/10/2017)  . ezetimibe (ZETIA) 10 MG tablet Take 1 tablet by mouth daily.  . metoprolol tartrate (LOPRESSOR) 25 MG tablet Take 12.5 mg by mouth.  . nitroGLYCERIN (NITROSTAT) 0.4 MG SL tablet Place 1 tablet (0.4 mg total) under the tongue every 5 (five) minutes as needed for chest pain. (Patient not taking: Reported on 05/27/2017)  . promethazine-codeine (PHENERGAN WITH CODEINE) 6.25-10 MG/5ML syrup as needed.   No facility-administered encounter medications on file as of 05/27/2017.     Allergy:  Allergies  Allergen Reactions  . Prednisone     Social Hx:   Social History   Socioeconomic History  . Marital status: Married    Spouse name: Not on file  . Number of children: Not on file  . Years of education: Not on file  . Highest education level: Not on file  Occupational History  . Not on file  Social Needs  . Financial resource strain: Not on file  . Food insecurity:    Worry: Not on file     Inability: Not on file  . Transportation needs:    Medical: Not on file    Non-medical: Not on file  Tobacco Use  . Smoking status: Never Smoker  . Smokeless tobacco: Never Used  Substance and Sexual Activity  . Alcohol use: No  . Drug use: No  . Sexual activity: Not on file  Lifestyle  . Physical activity:    Days per week: Not on file    Minutes per session: Not on file  . Stress: Not on file  Relationships  . Social connections:    Talks on phone: Not on file    Gets together: Not on file    Attends religious service: Not on file    Active member of club or organization: Not on file    Attends meetings of clubs or organizations: Not on file    Relationship status: Not on file  . Intimate partner violence:    Fear of current or ex partner: Not on file    Emotionally abused: Not on file    Physically abused:  Not on file    Forced sexual activity: Not on file  Other Topics Concern  . Not on file  Social History Narrative   Lives alone,retired.   Husband deceased    Past Surgical Hx:  Past Surgical History:  Procedure Laterality Date  . CESAREAN SECTION    . JOINT REPLACEMENT Right    hip  . neck back surgery  2009  . THYROID SURGERY      Past Medical Hx:  Past Medical History:  Diagnosis Date  . Asthma, moderate persistent 08/02/2013   6/9/2015Spiro: normal CXR normal    . Coronary atherosclerosis of native coronary artery 03/16/2015  . Hyperlipidemia   . Hypertension   . Left bundle branch block (LBBB) 03/04/2017  . 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 Seasonal allergies   . Thyroid disease     Past Gynecological History:  C/s x 2 No LMP recorded. Patient is postmenopausal.  Family Hx:  Family History  Problem Relation Age of Onset  . Heart failure Mother   . Depression Father        commited suicide  . Kidney cancer Brother   . Leukemia Brother     Review of Systems:  Constitutional  Feels well,    ENT Normal appearing ears and nares  bilaterally Skin/Breast  No rash, sores, jaundice, itching, dryness Cardiovascular  No chest pain, shortness of breath, or edema  Pulmonary  No cough or wheeze.  Gastro Intestinal  No nausea, vomitting, or diarrhoea. No bright red blood per rectum, no abdominal pain, change in bowel movement, or constipation.  Genito Urinary  No frequency, urgency, dysuria, + postmenopausal bleeding Musculo Skeletal  No myalgia, arthralgia, joint swelling or pain  Neurologic  No weakness, numbness, change in gait,  Psychology  No depression, anxiety, insomnia.   Vitals:  Blood pressure (!) 150/76, pulse 78, temperature (!) 97.3 F (36.3 C), temperature source Oral, resp. rate 20, height 5\' 3"  (1.6 m), weight 141 lb 8 oz (64.2 kg), SpO2 96 %.  Physical Exam: WD in NAD Neck  Supple NROM, without any enlargements.  Lymph Node Survey No cervical supraclavicular or inguinal adenopathy Cardiovascular  Pulse normal rate, regularity and rhythm. S1 and S2 normal.  Lungs  Clear to auscultation bilateraly, without wheezes/crackles/rhonchi. Good air movement.  Skin  No rash/lesions/breakdown  Psychiatry  Alert and oriented to person, place, and time  Abdomen  Normoactive bowel sounds, abdomen soft, non-tender and obese without evidence of hernia.  Back No CVA tenderness Genito Urinary  Vulva/vagina: Normal external female genitalia.   No lesions. No discharge or bleeding.  Bladder/urethra:  No lesions or masses, prolapsed bladder  Vagina: atrophic, normal, some prolapse  Cervix: Normal appearing, no lesions.  Uterus:  Small, mobile, no parametrial involvement or nodularity.  Adnexa: no palpable masses. Rectal  deferred  Extremities  No bilateral cyanosis, clubbing or edema.   Thereasa Solo, MD  05/27/2017, 6:34 PM

## 2017-05-27 NOTE — Patient Instructions (Addendum)
Preparing for your Surgery  Plan for surgery on June 15, 2017 with Dr. Everitt Amber at [facility] will be scheduled for a robotic assisted total hysterectomy, bilateral salpingo-oophorectomy, sentinel lymph node biopsy.  You can continue taking your aspirin 81 mg.    Pre-operative Testing -You will receive a phone call from presurgical testing at [facility] to arrange for a pre-operative testing appointment before your surgery.  This appointment normally occurs one to two weeks before your scheduled surgery.   -Bring your insurance card, copy of an advanced directive if applicable, medication list  -At that visit, you will be asked to sign a consent for a possible blood transfusion in case a transfusion becomes necessary during surgery.  The need for a blood transfusion is rare but having consent is a necessary part of your care.     -You should not be taking blood thinners or aspirin at least ten days prior to surgery unless instructed by your surgeon.  Day Before Surgery at [place] will be asked to take in a light diet the day before surgery.  Avoid carbonated beverages.  You will be advised to have nothing to eat or drink after midnight the evening before.    Eat a light diet the day before surgery.  Examples including soups, broths, toast, yogurt, mashed potatoes.  Things to avoid include carbonated beverages (fizzy beverages), raw fruits and raw vegetables, or beans.   If your bowels are filled with gas, your surgeon will have difficulty visualizing your pelvic organs which increases your surgical risks.  Your role in recovery Your role is to become active as soon as directed by your doctor, while still giving yourself time to heal.  Rest when you feel tired. You will be asked to do the following in order to speed your recovery:  - Cough and breathe deeply. This helps toclear and expand your lungs and can prevent pneumonia. You may be given a  spirometer to practice deep breathing. A staff member will show you how to use the spirometer. - Do mild physical activity. Walking or moving your legs help your circulation and body functions return to normal. A staff member will help you when you try to walk and will provide you with simple exercises. Do not try to get up or walk alone the first time. - Actively manage your pain. Managing your pain lets you move in comfort. We will ask you to rate your pain on a scale of zero to 10. It is your responsibility to tell your doctor or nurse where and how much you hurt so your pain can be treated.  Special Considerations -If you are diabetic, you may be placed on insulin after surgery to have closer control over your blood sugars to promote healing and recovery.  This does not mean that you will be discharged on insulin.  If applicable, your oral antidiabetics will be resumed when you are tolerating a solid diet.  -Your final pathology results from surgery should be available by the Friday after surgery and the results will be relayed to you when available.  -Dr. Lahoma Crocker is the Surgeon that assists your GYN Oncologist with surgery.  The next day after your surgery you will either see your GYN Oncologist or Dr. Lahoma Crocker.   Blood Transfusion Information WHAT IS A BLOOD TRANSFUSION? A transfusion is the replacement of blood or some of its parts. Blood is made up of multiple cells which provide different functions.  Red blood cells carry oxygen and are used for blood loss replacement.  White blood cells fight against infection.  Platelets control bleeding.  Plasma helps clot blood.  Other blood products are available for specialized needs, such as hemophilia or other clotting disorders. BEFORE THE TRANSFUSION  Who gives blood for transfusions?   You may be able to donate blood to be used at a later date on yourself (autologous donation).  Relatives can be asked to donate  blood. This is generally not any safer than if you have received blood from a stranger. The same precautions are taken to ensure safety when a relative's blood is donated.  Healthy volunteers who are fully evaluated to make sure their blood is safe. This is blood bank blood. Transfusion therapy is the safest it has ever been in the practice of medicine. Before blood is taken from a donor, a complete history is taken to make sure that person has no history of diseases nor engages in risky social behavior (examples are intravenous drug use or sexual activity with multiple partners). The donor's travel history is screened to minimize risk of transmitting infections, such as malaria. The donated blood is tested for signs of infectious diseases, such as HIV and hepatitis. The blood is then tested to be sure it is compatible with you in order to minimize the chance of a transfusion reaction. If you or a relative donates blood, this is often done in anticipation of surgery and is not appropriate for emergency situations. It takes many days to process the donated blood. RISKS AND COMPLICATIONS Although transfusion therapy is very safe and saves many lives, the main dangers of transfusion include:   Getting an infectious disease.  Developing a transfusion reaction. This is an allergic reaction to something in the blood you were given. Every precaution is taken to prevent this. The decision to have a blood transfusion has been considered carefully by your caregiver before blood is given. Blood is not given unless the benefits outweigh the risks.

## 2017-05-28 ENCOUNTER — Telehealth: Payer: Self-pay

## 2017-05-28 NOTE — Telephone Encounter (Signed)
-----   Message from Jenean Lindau, MD sent at 05/27/2017  3:31 PM EDT ----- I would prefer her to get an evaluation and possibly a stress test ----- Message ----- From: Dorothyann Gibbs, NP Sent: 05/27/2017   2:43 PM To: Jenean Lindau, MD  Hello,     Dr. Denman George, GYN Oncologist, saw Brittany Santiago today for newly diagnosed endometrial cancer.  We are planning on a robotic hysterectomy, bilateral salpingo-oophorectomy, sentinel lymph node biopsy on June 15, 2017. You last saw her in the office on Mar 10, 2017 (hx CABG x2, CAD, dyslipidemia, mixed hyperlipidemia).  We are writing to inquire about cardiac clearance for her to proceed with surgery.  Dr. Denman George informed the patient that she would be fine to continue her 81 mg aspirin up until the day before surgery.  Thank you for your time.  Sincerely,  Brittany John, NP

## 2017-05-28 NOTE — Telephone Encounter (Signed)
Voicemail was left for the patient to call the office to set an appointment.

## 2017-05-29 ENCOUNTER — Encounter: Payer: Self-pay | Admitting: Cardiology

## 2017-05-29 ENCOUNTER — Ambulatory Visit (INDEPENDENT_AMBULATORY_CARE_PROVIDER_SITE_OTHER): Payer: Medicare Other | Admitting: Cardiology

## 2017-05-29 VITALS — BP 150/70 | HR 78 | Ht 63.0 in | Wt 142.0 lb

## 2017-05-29 DIAGNOSIS — C541 Malignant neoplasm of endometrium: Secondary | ICD-10-CM | POA: Diagnosis not present

## 2017-05-29 DIAGNOSIS — N951 Menopausal and female climacteric states: Secondary | ICD-10-CM | POA: Insufficient documentation

## 2017-05-29 DIAGNOSIS — Z951 Presence of aortocoronary bypass graft: Secondary | ICD-10-CM

## 2017-05-29 DIAGNOSIS — E785 Hyperlipidemia, unspecified: Secondary | ICD-10-CM | POA: Diagnosis not present

## 2017-05-29 DIAGNOSIS — I2510 Atherosclerotic heart disease of native coronary artery without angina pectoris: Secondary | ICD-10-CM

## 2017-05-29 DIAGNOSIS — Z01810 Encounter for preprocedural cardiovascular examination: Secondary | ICD-10-CM | POA: Diagnosis not present

## 2017-05-29 DIAGNOSIS — I10 Essential (primary) hypertension: Secondary | ICD-10-CM

## 2017-05-29 HISTORY — DX: Encounter for preprocedural cardiovascular examination: Z01.810

## 2017-05-29 HISTORY — DX: Menopausal and female climacteric states: N95.1

## 2017-05-29 NOTE — Patient Instructions (Signed)
Medication Instructions:  Your physician recommends that you continue on your current medications as directed. Please refer to the Current Medication list given to you today.  Labwork: None  Testing/Procedures: Your physician has requested that you have a lexiscan myoview. For further information please visit [email or web address]. Please follow instruction sheet, as given.   Follow-Up: Your physician recommends that you schedule a follow-up appointment in: 6 months  Any Other Special Instructions Will Be Listed Below (If Applicable).     If you need a refill on your cardiac medications before your next appointment, please call your pharmacy.   [facility]  Khiya Friese A, RN, BSN  

## 2017-05-29 NOTE — Progress Notes (Signed)
Cardiology Office Note:    Date:  05/29/2017   ID:  Brittany Santiago, DOB 1930/11/27, MRN [id number]  PCP:  Nicoletta Dress, MD  Cardiologist:  Jenean Lindau, MD   Referring MD: Nicoletta Dress, MD    ASSESSMENT:    1. Coronary artery disease involving native coronary artery of native heart without angina pectoris   2. Essential hypertension   3. Dyslipidemia   4. S/P CABG x 2   5. Pre-operative cardiovascular examination    PLAN:    In order of problems listed above:  1. Secondary prevention stressed with the patient.  Importance of compliance with diet and medications stressed and she vocalized understanding.  I told her to start walking on a regular basis and this will help her especially in the perioperative.Marland Kitchen  Her blood pressure stable. 2. In view of significant coronary artery disease history she will undergo Lexiscan sestamibi.  We will do it early next week.  If this is negative then she has not at high risk for coronary events during the aforementioned surgery.  Meticulous hemodynamic monitoring will further reduce the risk of coronary events. 3. Patient will be seen in follow-up appointment in 6 months or earlier if the patient has any concerns    Medication Adjustments/Labs and Tests Ordered: Current medicines are reviewed at length with the patient today.  Concerns regarding medicines are outlined above.  No orders of the defined types were placed in this encounter.  No orders of the defined types were placed in this encounter.    Chief Complaint  Patient presents with  . Medical Clearance     History of Present Illness:    Brittany Santiago is a 84 y.o. female.  The patient has known coronary artery disease.  She denies any problems at this time and takes care of activities of daily living.  No chest pain orthopnea or PND.  She does not exercise regularly but activities of daily living do not without any symptoms.  At the time of my evaluation, the patient  is alert awake oriented and in no distress.  She is referred here because her OB/GYN doctor diagnosed her to have early endometrial cancer and she is planning to undergo hysterectomy.  Past Medical History:  Diagnosis Date  . Asthma, moderate persistent 08/02/2013   6/9/2015Spiro: normal CXR normal    . Coronary atherosclerosis of native coronary artery 03/16/2015  . Hyperlipidemia   . Hypertension   . Left bundle branch block (LBBB) 03/04/2017  . 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 Seasonal allergies   . Thyroid disease     Past Surgical History:  Procedure Laterality Date  . CESAREAN SECTION    . JOINT REPLACEMENT Right    hip  . neck back surgery  2009  . THYROID SURGERY      Current Medications: Current Meds  Medication Sig  . albuterol (PROVENTIL HFA;VENTOLIN HFA) 108 (90 BASE) MCG/ACT inhaler Inhale 2 puffs into the lungs every 6 (six) hours as needed for wheezing or shortness of breath.  . ALPRAZolam (XANAX) 0.25 MG tablet Take 1 tablet by mouth as needed.   Marland Kitchen amLODipine (NORVASC) 5 MG tablet Take 5 mg by mouth daily.  Marland Kitchen aspirin 81 MG tablet Take 81 mg by mouth daily.  Marland Kitchen atorvastatin (LIPITOR) 80 MG tablet Take 80 mg by mouth.  Marland Kitchen azithromycin (ZITHROMAX) 500 MG tablet azithromycin 500 mg tablet  . beclomethasone (QVAR) 80 MCG/ACT inhaler Inhale 2 puffs into the  lungs daily. (Patient taking differently: Inhale 2 puffs into the lungs as needed. )  . Cholecalciferol (VITAMIN D-3) 5000 UNITS TABS Take by mouth. Take 1 tablet daily by mouth  . ezetimibe (ZETIA) 10 MG tablet Take 1 tablet by mouth daily.  Marland Kitchen levothyroxine (SYNTHROID, LEVOTHROID) 88 MCG tablet Take by mouth.  . metoprolol tartrate (LOPRESSOR) 25 MG tablet Take 12.5 mg by mouth.  . Multiple Vitamins-Calcium (CALCI-MAX PO) Take by mouth daily.   . nitroGLYCERIN (NITROSTAT) 0.4 MG SL tablet Place 1 tablet (0.4 mg total) under the tongue every 5 (five) minutes as needed for chest pain.  Marland Kitchen omeprazole (PRILOSEC) 20 MG capsule Take  20 mg by mouth daily.  . promethazine-codeine (PHENERGAN WITH CODEINE) 6.25-10 MG/5ML syrup as needed.     Allergies:   Patient has no known allergies.   Social History   Socioeconomic History  . Marital status: Married    Spouse name: Not on file  . Number of children: Not on file  . Years of education: Not on file  . Highest education level: Not on file  Occupational History  . Not on file  Social Needs  . Financial resource strain: Not on file  . Food insecurity:    Worry: Not on file    Inability: Not on file  . Transportation needs:    Medical: Not on file    Non-medical: Not on file  Tobacco Use  . Smoking status: Never Smoker  . Smokeless tobacco: Never Used  Substance and Sexual Activity  . Alcohol use: No  . Drug use: No  . Sexual activity: Not on file  Lifestyle  . Physical activity:    Days per week: Not on file    Minutes per session: Not on file  . Stress: Not on file  Relationships  . Social connections:    Talks on phone: Not on file    Gets together: Not on file    Attends religious service: Not on file    Active member of club or organization: Not on file    Attends meetings of clubs or organizations: Not on file    Relationship status: Not on file  Other Topics Concern  . Not on file  Social History Narrative   Lives alone,retired.   Husband deceased     Family History: The patient's family history includes Depression in her father; Heart failure in her mother; Kidney cancer in her brother; Leukemia in her brother.  ROS:   Please see the history of present illness.    All other systems reviewed and are negative.  EKGs/Labs/Other Studies Reviewed:    The following studies were reviewed today: I discussed my findings from today with the patient at extensive length.  She vocalized understanding and questions were answered to her satisfaction.  EKG reveals sinus rhythm and nonspecific ST-T changes.  Intraventricular conduction  delay   Recent Labs: No results found for requested labs within last 8760 hours.  Recent Lipid Panel No results found for: CHOL, TRIG, HDL, CHOLHDL, VLDL, LDLCALC, LDLDIRECT  Physical Exam:    VS:  BP (!) 150/70 (BP Location: Right Arm, Patient Position: Sitting, Cuff Size: Normal)   Pulse 78   Ht 5\' 3"  (1.6 m)   Wt 142 lb (64.4 kg)   SpO2 98%   BMI 25.15 kg/m     Wt Readings from Last 3 Encounters:  05/29/17 142 lb (64.4 kg)  05/27/17 141 lb 8 oz (64.2 kg)  03/10/17 141 lb (64  kg)     GEN: Patient is in no acute distress HEENT: Normal NECK: No JVD; No carotid bruits LYMPHATICS: No lymphadenopathy CARDIAC: Hear sounds regular, 2/6 systolic murmur at the apex. RESPIRATORY:  Clear to auscultation without rales, wheezing or rhonchi  ABDOMEN: Soft, non-tender, non-distended MUSCULOSKELETAL:  No edema; No deformity  SKIN: Warm and dry NEUROLOGIC:  Alert and oriented x 3 PSYCHIATRIC:  Normal affect   Signed, Jenean Lindau, MD  05/29/2017 3:13 PM    [facility]

## 2017-05-29 NOTE — Addendum Note (Signed)
Addended by: Tarri Glenn on: 05/29/2017 04:39 PM   Modules accepted: Orders

## 2017-06-04 DIAGNOSIS — R079 Chest pain, unspecified: Secondary | ICD-10-CM | POA: Diagnosis not present

## 2017-06-04 DIAGNOSIS — Z01810 Encounter for preprocedural cardiovascular examination: Secondary | ICD-10-CM | POA: Diagnosis not present

## 2017-06-09 ENCOUNTER — Telehealth: Payer: Self-pay

## 2017-06-09 NOTE — Telephone Encounter (Signed)
Informed patient of normal stress test at [facility]. Routed to Dr. Denman George.

## 2017-06-09 NOTE — Telephone Encounter (Signed)
-----   Message from Jenean Lindau, MD sent at 06/08/2017  4:43 PM EDT ----- Sure why this message to me says myocardial perfusion scan. Jenean Lindau, MD 06/08/2017 4:42 PM

## 2017-06-09 NOTE — Patient Instructions (Signed)
Brittany Santiago  06/09/2017   Your procedure is scheduled on: 06-15-17   Report to [facility] Main  Entrance    Report to admitting at 6:00AM    Call this number if you have problems the morning of surgery [phone]     Remember: Light diet the day before surgery. No food after midnight. Only clear liquids after midnight. Nothing by mouth  3 hours prior to scheduled surgery. Please finish carbohydrate drink 3 hours prior to surgery!     Take these medicines the morning of surgery with A SIP OF WATER: ezetimibe(zetia), levothyroxine, metoprolol, omeprazole, eye drops. Xanax if needed                                 You may not have any metal on your body including hair pins and              piercings  Do not wear jewelry, make-up, lotions, powders or perfumes, deodorant             Do not wear nail polish.  Do not shave  48 hours prior to surgery.     Do not bring valuables to the hospital. [place].  Contacts, dentures or bridgework may not be worn into surgery.  Leave suitcase in the car. After surgery it may be brought to your room.                 Please read over the following fact sheets you were given: _____________________________________________________________________    CLEAR LIQUID DIET   Foods Allowed                                                                     Foods Excluded  Coffee and tea, regular and decaf                             liquids that you cannot  Plain Jell-O in any flavor                                             see through such as: Fruit ices (not with fruit pulp)                                     milk, soups, orange juice  Iced Popsicles                                    All solid food Carbonated beverages, regular and diet  Cranberry, grape and apple juices Sports drinks like Gatorade Lightly seasoned clear broth or  consume(fat free) Sugar, honey syrup  Sample Menu Breakfast                                Lunch                                     Supper Cranberry juice                    Beef broth                            Chicken broth Jell-O                                     Grape juice                           Apple juice Coffee or tea                        Jell-O                                      Popsicle                                                Coffee or tea                        Coffee or tea  _____________________________________________________________________  [facility] - Preparing for Surgery Before surgery, you can play an important role.  Because skin is not sterile, your skin needs to be as free of germs as possible.  You can reduce the number of germs on your skin by washing with CHG (chlorahexidine gluconate) soap before surgery.  CHG is an antiseptic cleaner which kills germs and bonds with the skin to continue killing germs even after washing. Please DO NOT use if you have an allergy to CHG or antibacterial soaps.  If your skin becomes reddened/irritated stop using the CHG and inform your nurse when you arrive at Short Stay. Do not shave (including legs and underarms) for at least 48 hours prior to the first CHG shower.  You may shave your face/neck. Please follow these instructions carefully:  1.  Shower with CHG Soap the night before surgery and the  morning of Surgery.  2.  If you choose to wash your hair, wash your hair first as usual with your  normal  shampoo.  3.  After you shampoo, rinse your hair and body thoroughly to remove the  shampoo.                           4.  Use CHG as you would any other liquid soap.  You can apply chg directly  to the skin and wash  Gently with a scrungie or clean washcloth.  5.  Apply the CHG Soap to your body ONLY FROM THE NECK DOWN.   Do not use on face/ open                           Wound or open sores.  Avoid contact with eyes, ears mouth and genitals (private parts).                       Wash face,  Genitals (private parts) with your normal soap.             6.  Wash thoroughly, paying special attention to the area where your surgery  will be performed.  7.  Thoroughly rinse your body with warm water from the neck down.  8.  DO NOT shower/wash with your normal soap after using and rinsing off  the CHG Soap.                9.  Pat yourself dry with a clean towel.            10.  Wear clean pajamas.            11.  Place clean sheets on your bed the night of your first shower and do not  sleep with pets. Day of Surgery : Do not apply any lotions/deodorants the morning of surgery.  Please wear clean clothes to the hospital/surgery center.  FAILURE TO FOLLOW THESE INSTRUCTIONS MAY RESULT IN THE CANCELLATION OF YOUR SURGERY PATIENT SIGNATURE_________________________________  NURSE SIGNATURE__________________________________  ________________________________________________________________________   Adam Phenix  An incentive spirometer is a tool that can help keep your lungs clear and active. This tool measures how well you are filling your lungs with each breath. Taking long deep breaths may help reverse or decrease the chance of developing breathing (pulmonary) problems (especially infection) following:  A long period of time when you are unable to move or be active. BEFORE THE PROCEDURE   If the spirometer includes an indicator to show your best effort, your nurse or respiratory therapist will set it to a desired goal.  If possible, sit up straight or lean slightly forward. Try not to slouch.  Hold the incentive spirometer in an upright position. INSTRUCTIONS FOR USE  1. Sit on the edge of your bed if possible, or sit up as far as you can in bed or on a chair. 2. Hold the incentive spirometer in an upright position. 3. Breathe out normally. 4. Place the mouthpiece in your  mouth and seal your lips tightly around it. 5. Breathe in slowly and as deeply as possible, raising the piston or the ball toward the top of the column. 6. Hold your breath for 3-5 seconds or for as long as possible. Allow the piston or ball to fall to the bottom of the column. 7. Remove the mouthpiece from your mouth and breathe out normally. 8. Rest for a few seconds and repeat Steps 1 through 7 at least 10 times every 1-2 hours when you are awake. Take your time and take a few normal breaths between deep breaths. 9. The spirometer may include an indicator to show your best effort. Use the indicator as a goal to work toward during each repetition. 10. After each set of 10 deep breaths, practice coughing to be sure your lungs are clear. If you have an incision (the cut made at the time of  surgery), support your incision when coughing by placing a pillow or rolled up towels firmly against it. Once you are able to get out of bed, walk around indoors and cough well. You may stop using the incentive spirometer when instructed by your caregiver.  RISKS AND COMPLICATIONS  Take your time so you do not get dizzy or light-headed.  If you are in pain, you may need to take or ask for pain medication before doing incentive spirometry. It is harder to take a deep breath if you are having pain. AFTER USE  Rest and breathe slowly and easily.  It can be helpful to keep track of a log of your progress. Your caregiver can provide you with a simple table to help with this. If you are using the spirometer at home, follow these instructions: [place] IF:   You are having difficultly using the spirometer.  You have trouble using the spirometer as often as instructed.  Your pain medication is not giving enough relief while using the spirometer.  You develop fever of 100.5 F (38.1 C) or higher. SEEK IMMEDIATE MEDICAL CARE IF:   You cough up bloody sputum that had not been present before.  You  develop fever of 102 F (38.9 C) or greater.  You develop worsening pain at or near the incision site. MAKE SURE YOU:   Understand these instructions.  Will watch your condition.  Will get help right away if you are not doing well or get worse. Document Released: 06/23/2006 Document Revised: 05/05/2011 Document Reviewed: 08/24/2006 ExitCare Patient Information 2014 Exit[employer], [place].   ________________________________________________________________________  WHAT IS A BLOOD TRANSFUSION? Blood Transfusion Information  A transfusion is the replacement of blood or some of its parts. Blood is made up of multiple cells which provide different functions.  Red blood cells carry oxygen and are used for blood loss replacement.  White blood cells fight against infection.  Platelets control bleeding.  Plasma helps clot blood.  Other blood products are available for specialized needs, such as hemophilia or other clotting disorders. BEFORE THE TRANSFUSION  Who gives blood for transfusions?   Healthy volunteers who are fully evaluated to make sure their blood is safe. This is blood bank blood. Transfusion therapy is the safest it has ever been in the practice of medicine. Before blood is taken from a donor, a complete history is taken to make sure that person has no history of diseases nor engages in risky social behavior (examples are intravenous drug use or sexual activity with multiple partners). The donor's travel history is screened to minimize risk of transmitting infections, such as malaria. The donated blood is tested for signs of infectious diseases, such as HIV and hepatitis. The blood is then tested to be sure it is compatible with you in order to minimize the chance of a transfusion reaction. If you or a relative donates blood, this is often done in anticipation of surgery and is not appropriate for emergency situations. It takes many days to process the donated blood. RISKS AND  COMPLICATIONS Although transfusion therapy is very safe and saves many lives, the main dangers of transfusion include:   Getting an infectious disease.  Developing a transfusion reaction. This is an allergic reaction to something in the blood you were given. Every precaution is taken to prevent this. The decision to have a blood transfusion has been considered carefully by your caregiver before blood is given. Blood is not given unless the benefits outweigh the risks. AFTER THE  TRANSFUSION  Right after receiving a blood transfusion, you will usually feel much better and more energetic. This is especially true if your red blood cells have gotten low (anemic). The transfusion raises the level of the red blood cells which carry oxygen, and this usually causes an energy increase.  The nurse administering the transfusion will monitor you carefully for complications. HOME CARE INSTRUCTIONS  No special instructions are needed after a transfusion. You may find your energy is better. Speak with your caregiver about any limitations on activity for underlying diseases you may have. SEEK MEDICAL CARE IF:   Your condition is not improving after your transfusion.  You develop redness or irritation at the intravenous (IV) site. SEEK IMMEDIATE MEDICAL CARE IF:  Any of the following symptoms occur over the next 12 hours:  Shaking chills.  You have a temperature by mouth above 102 F (38.9 C), not controlled by medicine.  Chest, back, or muscle pain.  People around you feel you are not acting correctly or are confused.  Shortness of breath or difficulty breathing.  Dizziness and fainting.  You get a rash or develop hives.  You have a decrease in urine output.  Your urine turns a dark color or changes to pink, red, or brown. Any of the following symptoms occur over the next 10 days:  You have a temperature by mouth above 102 F (38.9 C), not controlled by medicine.  Shortness of  breath.  Weakness after normal activity.  The white part of the eye turns yellow (jaundice).  You have a decrease in the amount of urine or are urinating less often.  Your urine turns a dark color or changes to pink, red, or brown. Document Released: 02/08/2000 Document Revised: 05/05/2011 Document Reviewed: 09/27/2007 [facility] Patient Information 2014 [place], [place].  _______________________________________________________________________

## 2017-06-09 NOTE — Progress Notes (Signed)
LOV CARDIOLOGY DR Geraldo Pitter 05-29-17 Epic   EKG 05-29-17 Epic   STRESS TEST 06-04-17 Epic

## 2017-06-10 ENCOUNTER — Encounter (HOSPITAL_COMMUNITY)
Admission: RE | Admit: 2017-06-10 | Discharge: 2017-06-10 | Disposition: A | Discharge status: Home / Self Care | Payer: Medicare Other | Source: Ambulatory Visit | Attending: Gynecologic Oncology | Admitting: Gynecologic Oncology

## 2017-06-10 ENCOUNTER — Other Ambulatory Visit: Payer: Self-pay

## 2017-06-10 ENCOUNTER — Encounter (HOSPITAL_COMMUNITY): Payer: Self-pay

## 2017-06-10 DIAGNOSIS — Z01812 Encounter for preprocedural laboratory examination: Secondary | ICD-10-CM | POA: Diagnosis not present

## 2017-06-10 HISTORY — DX: Hypothyroidism, unspecified: E03.9

## 2017-06-10 HISTORY — DX: Malignant (primary) neoplasm, unspecified: C80.1

## 2017-06-10 LAB — COMPREHENSIVE METABOLIC PANEL
ALBUMIN: 4.3 g/dL (ref 3.5–5.0)
ALK PHOS: 62 U/L (ref 38–126)
ALT: 30 U/L (ref 14–54)
AST: 40 U/L (ref 15–41)
Anion gap: 9 (ref 5–15)
BILIRUBIN TOTAL: 1.3 mg/dL — AB (ref 0.3–1.2)
BUN: 12 mg/dL (ref 6–20)
CALCIUM: 9.6 mg/dL (ref 8.9–10.3)
CO2: 25 mmol/L (ref 22–32)
Chloride: 105 mmol/L (ref 101–111)
Creatinine, Ser: 0.8 mg/dL (ref 0.44–1.00)
GFR calc Af Amer: >60 mL/min (ref >60)
GFR calc non Af Amer: >60 mL/min (ref >60)
GLUCOSE: 106 mg/dL — AB (ref 65–99)
Potassium: 4.7 mmol/L (ref 3.5–5.1)
Sodium: 139 mmol/L (ref 135–145)
TOTAL PROTEIN: 7.4 g/dL (ref 6.5–8.1)

## 2017-06-10 LAB — CBC
HEMATOCRIT: 42.8 % (ref 36.0–46.0)
HEMOGLOBIN: 14.2 g/dL (ref 12.0–15.0)
MCH: 30.1 pg (ref 26.0–34.0)
MCHC: 33.2 g/dL (ref 30.0–36.0)
MCV: 90.7 fL (ref 78.0–100.0)
Platelets: 308 10*3/uL (ref 150–400)
RBC: 4.72 MIL/uL (ref 3.87–5.11)
RDW: 13.7 % (ref 11.5–15.5)
WBC: 7.4 10*3/uL (ref 4.0–10.5)

## 2017-06-10 LAB — URINALYSIS, ROUTINE W REFLEX MICROSCOPIC
Bilirubin Urine: NEGATIVE
GLUCOSE, UA: NEGATIVE mg/dL
Hgb urine dipstick: NEGATIVE
Ketones, ur: NEGATIVE mg/dL
LEUKOCYTES UA: NEGATIVE
Nitrite: NEGATIVE
PH: 7 (ref 5.0–8.0)
PROTEIN: NEGATIVE mg/dL
Specific Gravity, Urine: 1.006 (ref 1.005–1.030)

## 2017-06-10 LAB — ABO/RH: ABO/RH(D): A POS

## 2017-06-14 NOTE — Anesthesia Preprocedure Evaluation (Addendum)
Anesthesia Evaluation  Patient identified by MRN, date of birth, ID band Patient awake    Reviewed: Allergy & Precautions, NPO status , Patient's Chart, lab work & pertinent test results, reviewed documented beta blocker date and time   Airway Mallampati: III  TM Distance: >3 FB Neck ROM: Full    Dental no notable dental hx.    Pulmonary asthma ,    Pulmonary exam normal breath sounds clear to auscultation       Cardiovascular hypertension, Pt. on medications and Pt. on home beta blockers + CAD and + CABG  Normal cardiovascular exam+ dysrhythmias  Rhythm:Regular Rate:Normal     Neuro/Psych negative neurological ROS  negative psych ROS   GI/Hepatic negative GI ROS, Neg liver ROS,   Endo/Other  Hypothyroidism   Renal/GU negative Renal ROS     Musculoskeletal negative musculoskeletal ROS (+)   Abdominal   Peds  Hematology negative hematology ROS (+)   Anesthesia Other Findings   Reproductive/Obstetrics negative OB ROS                            Anesthesia Physical Anesthesia Plan  ASA: III  Anesthesia Plan: General   Post-op Pain Management:    Induction: Intravenous  PONV Risk Score and Plan: 4 or greater and Ondansetron, Dexamethasone and Treatment may vary due to age or medical condition  Airway Management Planned: Oral ETT  Additional Equipment:   Intra-op Plan:   Post-operative Plan: Extubation in OR  Informed Consent: I have reviewed the patients History and Physical, chart, labs and discussed the procedure including the risks, benefits and alternatives for the proposed anesthesia with the patient or authorized representative who has indicated his/her understanding and acceptance.   Dental advisory given  Plan Discussed with: CRNA  Anesthesia Plan Comments:         Anesthesia Quick Evaluation

## 2017-06-15 ENCOUNTER — Ambulatory Visit (HOSPITAL_COMMUNITY): Payer: Medicare Other | Admitting: Anesthesiology

## 2017-06-15 ENCOUNTER — Encounter (HOSPITAL_COMMUNITY): Payer: Self-pay | Admitting: Certified Registered Nurse Anesthetist

## 2017-06-15 ENCOUNTER — Encounter (HOSPITAL_COMMUNITY)
Admission: RE | Disposition: A | Discharge status: Home / Self Care | Payer: Self-pay | Source: Ambulatory Visit | Attending: Gynecologic Oncology

## 2017-06-15 ENCOUNTER — Ambulatory Visit (HOSPITAL_COMMUNITY)
Admission: RE | Admit: 2017-06-15 | Discharge: 2017-06-16 | Disposition: A | Discharge status: Home / Self Care | Payer: Medicare Other | Source: Ambulatory Visit | Attending: Gynecologic Oncology | Admitting: Gynecologic Oncology

## 2017-06-15 DIAGNOSIS — Z951 Presence of aortocoronary bypass graft: Secondary | ICD-10-CM | POA: Diagnosis not present

## 2017-06-15 DIAGNOSIS — I2510 Atherosclerotic heart disease of native coronary artery without angina pectoris: Secondary | ICD-10-CM | POA: Insufficient documentation

## 2017-06-15 DIAGNOSIS — J45909 Unspecified asthma, uncomplicated: Secondary | ICD-10-CM | POA: Insufficient documentation

## 2017-06-15 DIAGNOSIS — E039 Hypothyroidism, unspecified: Secondary | ICD-10-CM | POA: Diagnosis not present

## 2017-06-15 DIAGNOSIS — N83201 Unspecified ovarian cyst, right side: Secondary | ICD-10-CM | POA: Insufficient documentation

## 2017-06-15 DIAGNOSIS — D251 Intramural leiomyoma of uterus: Secondary | ICD-10-CM | POA: Insufficient documentation

## 2017-06-15 DIAGNOSIS — I10 Essential (primary) hypertension: Secondary | ICD-10-CM | POA: Diagnosis not present

## 2017-06-15 DIAGNOSIS — N736 Female pelvic peritoneal adhesions (postinfective): Secondary | ICD-10-CM | POA: Diagnosis not present

## 2017-06-15 DIAGNOSIS — Z7982 Long term (current) use of aspirin: Secondary | ICD-10-CM | POA: Diagnosis not present

## 2017-06-15 DIAGNOSIS — E785 Hyperlipidemia, unspecified: Secondary | ICD-10-CM | POA: Diagnosis not present

## 2017-06-15 DIAGNOSIS — Z79899 Other long term (current) drug therapy: Secondary | ICD-10-CM | POA: Insufficient documentation

## 2017-06-15 DIAGNOSIS — N83202 Unspecified ovarian cyst, left side: Secondary | ICD-10-CM | POA: Diagnosis not present

## 2017-06-15 DIAGNOSIS — C542 Malignant neoplasm of myometrium: Secondary | ICD-10-CM | POA: Diagnosis not present

## 2017-06-15 DIAGNOSIS — C541 Malignant neoplasm of endometrium: Secondary | ICD-10-CM | POA: Diagnosis not present

## 2017-06-15 DIAGNOSIS — N838 Other noninflammatory disorders of ovary, fallopian tube and broad ligament: Secondary | ICD-10-CM | POA: Diagnosis not present

## 2017-06-15 HISTORY — PX: ROBOTIC ASSISTED TOTAL HYSTERECTOMY WITH BILATERAL SALPINGO OOPHERECTOMY: SHX6086

## 2017-06-15 LAB — CREATININE, SERUM
CREATININE: 0.71 mg/dL (ref 0.44–1.00)
GFR calc Af Amer: >60 mL/min (ref >60)

## 2017-06-15 LAB — CBC
HEMATOCRIT: 38.8 % (ref 36.0–46.0)
HEMOGLOBIN: 13.3 g/dL (ref 12.0–15.0)
MCH: 30.8 pg (ref 26.0–34.0)
MCHC: 34.3 g/dL (ref 30.0–36.0)
MCV: 89.8 fL (ref 78.0–100.0)
Platelets: 237 10*3/uL (ref 150–400)
RBC: 4.32 MIL/uL (ref 3.87–5.11)
RDW: 13.4 % (ref 11.5–15.5)
WBC: 9.8 10*3/uL (ref 4.0–10.5)

## 2017-06-15 LAB — TYPE AND SCREEN
ABO/RH(D): A POS
Antibody Screen: NEGATIVE

## 2017-06-15 SURGERY — HYSTERECTOMY, TOTAL, ROBOT-ASSISTED, LAPAROSCOPIC, WITH BILATERAL SALPINGO-OOPHORECTOMY
Anesthesia: General | Laterality: Bilateral

## 2017-06-15 MED ORDER — NITROGLYCERIN 0.4 MG SL SUBL: 0.4000 mg | SUBLINGUAL_TABLET | SUBLINGUAL | Status: DC | PRN

## 2017-06-15 MED ORDER — MEPERIDINE HCL 50 MG/ML IJ SOLN: 6.2500 mg | INTRAMUSCULAR | Status: DC | PRN

## 2017-06-15 MED ORDER — LACTATED RINGERS IV SOLN
INTRAVENOUS | Status: DC
  Administered 2017-06-15: 1000 mL via INTRAVENOUS
  Administered 2017-06-15: 10:00:00 via INTRAVENOUS

## 2017-06-15 MED ORDER — HYDROMORPHONE HCL 1 MG/ML IJ SOLN: 0.5000 mg | INTRAMUSCULAR | Status: DC | PRN

## 2017-06-15 MED ORDER — FENTANYL CITRATE (PF) 100 MCG/2ML IJ SOLN
INTRAMUSCULAR | Status: AC
  Filled 2017-06-15: qty 2

## 2017-06-15 MED ORDER — FENTANYL CITRATE (PF) 100 MCG/2ML IJ SOLN
25.0000 ug | INTRAMUSCULAR | Status: DC | PRN
  Administered 2017-06-15 (×3): 25 ug via INTRAVENOUS

## 2017-06-15 MED ORDER — DEXAMETHASONE SODIUM PHOSPHATE 10 MG/ML IJ SOLN
INTRAMUSCULAR | Status: DC | PRN
  Administered 2017-06-15: 10 mg via INTRAVENOUS

## 2017-06-15 MED ORDER — CEFAZOLIN SODIUM-DEXTROSE 2-4 GM/100ML-% IV SOLN
2.0000 g | INTRAVENOUS | Status: AC
  Administered 2017-06-15: 2 g via INTRAVENOUS
  Filled 2017-06-15: qty 100

## 2017-06-15 MED ORDER — SUGAMMADEX SODIUM 200 MG/2ML IV SOLN
INTRAVENOUS | Status: DC | PRN
  Administered 2017-06-15: 60 mg via INTRAVENOUS
  Administered 2017-06-15: 200 mg via INTRAVENOUS

## 2017-06-15 MED ORDER — KETOROLAC TROMETHAMINE 15 MG/ML IJ SOLN: 15.0000 mg | Freq: Four times a day (QID) | INTRAMUSCULAR | Status: DC | PRN

## 2017-06-15 MED ORDER — METOPROLOL TARTRATE 12.5 MG HALF TABLET
12.5000 mg | ORAL_TABLET | Freq: Two times a day (BID) | ORAL | Status: DC
Start: 2017-06-15 — End: 2017-06-16
  Administered 2017-06-15: 12.5 mg via ORAL
  Filled 2017-06-15: qty 1

## 2017-06-15 MED ORDER — ACETAMINOPHEN 500 MG PO TABS
1000.0000 mg | ORAL_TABLET | ORAL | Status: AC
  Administered 2017-06-15: 1000 mg via ORAL
  Filled 2017-06-15: qty 2

## 2017-06-15 MED ORDER — GABAPENTIN 300 MG PO CAPS
300.0000 mg | ORAL_CAPSULE | Freq: Two times a day (BID) | ORAL | Status: DC
  Administered 2017-06-15 – 2017-06-16 (×3): 300 mg via ORAL
  Filled 2017-06-15 (×3): qty 1

## 2017-06-15 MED ORDER — ENOXAPARIN SODIUM 40 MG/0.4ML ~~LOC~~ SOLN
40.0000 mg | SUBCUTANEOUS | Status: DC
  Administered 2017-06-16: 40 mg via SUBCUTANEOUS
  Filled 2017-06-15: qty 0.4

## 2017-06-15 MED ORDER — ROCURONIUM BROMIDE 50 MG/5ML IV SOSY
PREFILLED_SYRINGE | INTRAVENOUS | Status: DC | PRN
  Administered 2017-06-15: 20 mg via INTRAVENOUS
  Administered 2017-06-15: 50 mg via INTRAVENOUS

## 2017-06-15 MED ORDER — LIDOCAINE 2% (20 MG/ML) 5 ML SYRINGE
INTRAMUSCULAR | Status: AC
  Filled 2017-06-15: qty 5

## 2017-06-15 MED ORDER — AMLODIPINE BESYLATE 5 MG PO TABS
5.0000 mg | ORAL_TABLET | Freq: Every day | ORAL | Status: DC
  Administered 2017-06-15: 5 mg via ORAL
  Filled 2017-06-15: qty 1

## 2017-06-15 MED ORDER — OXYCODONE-ACETAMINOPHEN 5-325 MG PO TABS: 1.0000 | ORAL_TABLET | ORAL | Status: DC | PRN

## 2017-06-15 MED ORDER — KETOTIFEN FUMARATE 0.025 % OP SOLN: 1.0000 [drp] | Freq: Every day | OPHTHALMIC | Status: DC | PRN

## 2017-06-15 MED ORDER — SCOPOLAMINE 1 MG/3DAYS TD PT72
1.0000 | MEDICATED_PATCH | TRANSDERMAL | Status: DC
  Administered 2017-06-15: 1.5 mg via TRANSDERMAL
  Filled 2017-06-15: qty 1

## 2017-06-15 MED ORDER — FENTANYL CITRATE (PF) 100 MCG/2ML IJ SOLN
INTRAMUSCULAR | Status: DC | PRN
  Administered 2017-06-15 (×4): 50 ug via INTRAVENOUS

## 2017-06-15 MED ORDER — CELECOXIB 200 MG PO CAPS
200.0000 mg | ORAL_CAPSULE | Freq: Two times a day (BID) | ORAL | Status: DC
  Administered 2017-06-15 – 2017-06-16 (×3): 200 mg via ORAL
  Filled 2017-06-15 (×3): qty 1

## 2017-06-15 MED ORDER — STERILE WATER FOR IRRIGATION IR SOLN
Status: DC | PRN
  Administered 2017-06-15: 1000 mL

## 2017-06-15 MED ORDER — STERILE WATER FOR INJECTION IJ SOLN
INTRAMUSCULAR | Status: AC
  Filled 2017-06-15: qty 10

## 2017-06-15 MED ORDER — SUGAMMADEX SODIUM 200 MG/2ML IV SOLN
INTRAVENOUS | Status: AC
  Filled 2017-06-15: qty 2

## 2017-06-15 MED ORDER — LIDOCAINE 2% (20 MG/ML) 5 ML SYRINGE
INTRAMUSCULAR | Status: DC | PRN
  Administered 2017-06-15: 100 mg via INTRAVENOUS

## 2017-06-15 MED ORDER — ONDANSETRON HCL 4 MG/2ML IJ SOLN
INTRAMUSCULAR | Status: AC
  Filled 2017-06-15: qty 2

## 2017-06-15 MED ORDER — PANTOPRAZOLE SODIUM 40 MG PO TBEC
40.0000 mg | DELAYED_RELEASE_TABLET | Freq: Every day | ORAL | Status: DC
  Administered 2017-06-16: 40 mg via ORAL
  Filled 2017-06-15: qty 1

## 2017-06-15 MED ORDER — GABAPENTIN 300 MG PO CAPS
300.0000 mg | ORAL_CAPSULE | ORAL | Status: AC
  Administered 2017-06-15: 300 mg via ORAL
  Filled 2017-06-15: qty 1

## 2017-06-15 MED ORDER — ROCURONIUM BROMIDE 10 MG/ML (PF) SYRINGE
PREFILLED_SYRINGE | INTRAVENOUS | Status: AC
  Filled 2017-06-15: qty 5

## 2017-06-15 MED ORDER — DEXTROSE-NACL 5-0.45 % IV SOLN
INTRAVENOUS | Status: DC
  Administered 2017-06-15: 15:00:00 via INTRAVENOUS

## 2017-06-15 MED ORDER — ONDANSETRON HCL 4 MG PO TABS: 4.0000 mg | ORAL_TABLET | Freq: Four times a day (QID) | ORAL | Status: DC | PRN

## 2017-06-15 MED ORDER — ENOXAPARIN SODIUM 40 MG/0.4ML ~~LOC~~ SOLN
40.0000 mg | SUBCUTANEOUS | Status: AC
  Administered 2017-06-15: 40 mg via SUBCUTANEOUS
  Filled 2017-06-15: qty 0.4

## 2017-06-15 MED ORDER — LEVOTHYROXINE SODIUM 88 MCG PO TABS
88.0000 ug | ORAL_TABLET | Freq: Every day | ORAL | Status: DC
  Administered 2017-06-16: 88 ug via ORAL
  Filled 2017-06-15: qty 1

## 2017-06-15 MED ORDER — CELECOXIB 200 MG PO CAPS
400.0000 mg | ORAL_CAPSULE | ORAL | Status: AC
  Administered 2017-06-15: 400 mg via ORAL
  Filled 2017-06-15: qty 2

## 2017-06-15 MED ORDER — PHENYLEPHRINE HCL 10 MG/ML IJ SOLN
INTRAMUSCULAR | Status: DC | PRN
  Administered 2017-06-15: 40 ug via INTRAVENOUS

## 2017-06-15 MED ORDER — ONDANSETRON HCL 4 MG/2ML IJ SOLN
INTRAMUSCULAR | Status: DC | PRN
  Administered 2017-06-15: 4 mg via INTRAVENOUS

## 2017-06-15 MED ORDER — PHENYLEPHRINE 40 MCG/ML (10ML) SYRINGE FOR IV PUSH (FOR BLOOD PRESSURE SUPPORT)
PREFILLED_SYRINGE | INTRAVENOUS | Status: AC
  Filled 2017-06-15: qty 10

## 2017-06-15 MED ORDER — EPHEDRINE SULFATE-NACL 50-0.9 MG/10ML-% IV SOSY
PREFILLED_SYRINGE | INTRAVENOUS | Status: DC | PRN
  Administered 2017-06-15 (×4): 10 mg via INTRAVENOUS

## 2017-06-15 MED ORDER — DEXAMETHASONE SODIUM PHOSPHATE 4 MG/ML IJ SOLN: 4.0000 mg | INTRAMUSCULAR | Status: DC

## 2017-06-15 MED ORDER — LACTATED RINGERS IR SOLN
Status: DC | PRN
  Administered 2017-06-15: 1000 mL

## 2017-06-15 MED ORDER — PROPOFOL 10 MG/ML IV BOLUS
INTRAVENOUS | Status: DC | PRN
  Administered 2017-06-15: 120 mg via INTRAVENOUS

## 2017-06-15 MED ORDER — PROPOFOL 10 MG/ML IV BOLUS
INTRAVENOUS | Status: AC
  Filled 2017-06-15: qty 40

## 2017-06-15 MED ORDER — ALPRAZOLAM 0.25 MG PO TABS: 0.2500 mg | ORAL_TABLET | Freq: Every day | ORAL | Status: DC | PRN

## 2017-06-15 MED ORDER — EPHEDRINE SULFATE 50 MG/ML IJ SOLN
INTRAMUSCULAR | Status: DC | PRN
  Administered 2017-06-15: 10 mg via INTRAVENOUS
  Administered 2017-06-15: 5 mg via INTRAVENOUS

## 2017-06-15 MED ORDER — ACETAMINOPHEN 500 MG PO TABS
1000.0000 mg | ORAL_TABLET | Freq: Four times a day (QID) | ORAL | Status: DC
  Administered 2017-06-15 – 2017-06-16 (×3): 1000 mg via ORAL
  Filled 2017-06-15 (×3): qty 2

## 2017-06-15 MED ORDER — EZETIMIBE 10 MG PO TABS
10.0000 mg | ORAL_TABLET | Freq: Every day | ORAL | Status: DC
  Administered 2017-06-15 – 2017-06-16 (×2): 10 mg via ORAL
  Filled 2017-06-15 (×2): qty 1

## 2017-06-15 MED ORDER — ATORVASTATIN CALCIUM 40 MG PO TABS
80.0000 mg | ORAL_TABLET | Freq: Every day | ORAL | Status: DC
  Administered 2017-06-15: 80 mg via ORAL
  Filled 2017-06-15: qty 2

## 2017-06-15 MED ORDER — STERILE WATER FOR INJECTION IJ SOLN
INTRAMUSCULAR | Status: DC | PRN
  Administered 2017-06-15: 10 mL

## 2017-06-15 MED ORDER — ONDANSETRON HCL 4 MG/2ML IJ SOLN: 4.0000 mg | Freq: Four times a day (QID) | INTRAMUSCULAR | Status: DC | PRN

## 2017-06-15 MED ORDER — PROMETHAZINE HCL 25 MG/ML IJ SOLN: 6.2500 mg | INTRAMUSCULAR | Status: DC | PRN

## 2017-06-15 MED ORDER — EPHEDRINE 5 MG/ML INJ
INTRAVENOUS | Status: AC
  Filled 2017-06-15: qty 10

## 2017-06-15 MED ORDER — PHENYLEPHRINE 40 MCG/ML (10ML) SYRINGE FOR IV PUSH (FOR BLOOD PRESSURE SUPPORT)
PREFILLED_SYRINGE | INTRAVENOUS | Status: DC | PRN
  Administered 2017-06-15 (×2): 80 ug via INTRAVENOUS
  Administered 2017-06-15 (×2): 40 ug via INTRAVENOUS

## 2017-06-15 MED ORDER — DEXAMETHASONE SODIUM PHOSPHATE 10 MG/ML IJ SOLN
INTRAMUSCULAR | Status: AC
  Filled 2017-06-15: qty 1

## 2017-06-15 MED ORDER — FENTANYL CITRATE (PF) 250 MCG/5ML IJ SOLN
INTRAMUSCULAR | Status: AC
  Filled 2017-06-15: qty 5

## 2017-06-15 SURGICAL SUPPLY — 45 items
APPLICATOR SURGIFLO ENDO (HEMOSTASIS) IMPLANT
BAG LAPAROSCOPIC 12 15 PORT 16 (BASKET) IMPLANT
BAG RETRIEVAL 12/15 (BASKET)
COVER BACK TABLE 60X90IN (DRAPES) ×2 IMPLANT
COVER TIP SHEARS 8 DVNC (MISCELLANEOUS) ×1 IMPLANT
COVER TIP SHEARS 8MM DA VINCI (MISCELLANEOUS) ×1
DERMABOND ADVANCED (GAUZE/BANDAGES/DRESSINGS) ×1
DERMABOND ADVANCED .7 DNX12 (GAUZE/BANDAGES/DRESSINGS) ×1 IMPLANT
DRAPE ARM DVNC X/XI (DISPOSABLE) ×4 IMPLANT
DRAPE COLUMN DVNC XI (DISPOSABLE) ×1 IMPLANT
DRAPE DA VINCI XI ARM (DISPOSABLE) ×4
DRAPE DA VINCI XI COLUMN (DISPOSABLE) ×1
DRAPE SHEET LG 3/4 BI-LAMINATE (DRAPES) ×2 IMPLANT
DRAPE SURG IRRIG POUCH 19X23 (DRAPES) ×2 IMPLANT
ELECT REM PT RETURN 15FT ADLT (MISCELLANEOUS) ×2 IMPLANT
GLOVE BIO SURGEON STRL SZ 6 (GLOVE) ×8 IMPLANT
GLOVE BIO SURGEON STRL SZ 6.5 (GLOVE) ×4 IMPLANT
GOWN STRL REUS W/ TWL LRG LVL3 (GOWN DISPOSABLE) ×3 IMPLANT
GOWN STRL REUS W/TWL LRG LVL3 (GOWN DISPOSABLE) ×3
HOLDER FOLEY CATH W/STRAP (MISCELLANEOUS) ×2 IMPLANT
IRRIG SUCT STRYKERFLOW 2 WTIP (MISCELLANEOUS) ×2
IRRIGATION SUCT STRKRFLW 2 WTP (MISCELLANEOUS) ×1 IMPLANT
KIT PROCEDURE DA VINCI SI (MISCELLANEOUS) ×1
KIT PROCEDURE DVNC SI (MISCELLANEOUS) ×1 IMPLANT
MANIPULATOR UTERINE 4.5 ZUMI (MISCELLANEOUS) ×2 IMPLANT
NEEDLE SPNL 18GX3.5 QUINCKE PK (NEEDLE) ×2 IMPLANT
OBTURATOR OPTICAL STANDARD 8MM (TROCAR) ×1
OBTURATOR OPTICAL STND 8 DVNC (TROCAR) ×1
OBTURATOR OPTICALSTD 8 DVNC (TROCAR) ×1 IMPLANT
PACK ROBOT GYN CUSTOM WL (TRAY / TRAY PROCEDURE) ×2 IMPLANT
PAD POSITIONING PINK XL (MISCELLANEOUS) ×2 IMPLANT
POUCH SPECIMEN RETRIEVAL 10MM (ENDOMECHANICALS) ×4 IMPLANT
SCISSORS LAP 5X35 DISP (ENDOMECHANICALS) ×2 IMPLANT
SEAL CANN UNIV 5-8 DVNC XI (MISCELLANEOUS) ×4 IMPLANT
SEAL XI 5MM-8MM UNIVERSAL (MISCELLANEOUS) ×4
SET TRI-LUMEN FLTR TB AIRSEAL (TUBING) ×2 IMPLANT
SLEEVE XCEL OPT CAN 5 100 (ENDOMECHANICALS) ×2 IMPLANT
SURGIFLO W/THROMBIN 8M KIT (HEMOSTASIS) IMPLANT
SUT VIC AB 0 CT1 27 (SUTURE)
SUT VIC AB 0 CT1 27XBRD ANTBC (SUTURE) IMPLANT
SYR 10ML LL (SYRINGE) ×2 IMPLANT
TOWEL OR NON WOVEN STRL DISP B (DISPOSABLE) ×2 IMPLANT
TRAP SPECIMEN MUCOUS 40CC (MISCELLANEOUS) IMPLANT
TRAY FOLEY W/METER SILVER 16FR (SET/KITS/TRAYS/PACK) ×2 IMPLANT
UNDERPAD 30X30 (UNDERPADS AND DIAPERS) ×2 IMPLANT

## 2017-06-15 NOTE — Op Note (Signed)
OPERATIVE NOTE 06/15/17  Surgeon: Donaciano Eva   Assistants: Dr Lahoma Crocker (an MD assistant was necessary for tissue manipulation, management of robotic instrumentation, retraction and positioning due to the complexity of the case and hospital policies).   Anesthesia: General endotracheal anesthesia  ASA Class: 3   Pre-operative Diagnosis: endometrial cancer grade 1  Post-operative Diagnosis: same,   Operation: Robotic-assisted laparoscopic total hysterectomy with bilateral salpingoophorectomy, SLN mapping, pelvic lymphadenectomy, lysis of adhesions  Surgeon: Donaciano Eva  Assistant Surgeon: Lahoma Crocker MD  Anesthesia: GET  Urine Output: 300  Operative Findings:  : adhesions (dense and vascular) between anterior abdominal wall and omentum, and between uterus and omentum and sigmoid colon and uterus.  Oozing from all peritoneal surfaces consistent with aspirin induced coagulopathy. Dense adhesions between uterus and bladder from prior c/section. No apparent extrauterine disease. Unilateral SLN mapping to left.   Estimated Blood Loss:  less than 100 mL      Total IV Fluids: 700 ml         Specimens: uterus, cervix, bilateral tubes and ovaries, distal and proximal left SLN's, right pelvic lymphadenectomy, omentum         Complications:  None; patient tolerated the procedure well.         Disposition: PACU - hemodynamically stable.  Procedure Details  The patient was seen in the Holding Room. The risks, benefits, complications, treatment options, and expected outcomes were discussed with the patient.  The patient concurred with the proposed plan, giving informed consent.  The site of surgery properly noted/marked. The patient was identified as Brittany Santiago and the procedure verified as a Robotic-assisted hysterectomy with bilateral salpingo oophorectomy with SLN biopsy. A Time Out was held and the above information confirmed.  After induction of  anesthesia, the patient was draped and prepped in the usual sterile manner. Pt was placed in supine position after anesthesia and draped and prepped in the usual sterile manner. The abdominal drape was placed after the CholoraPrep had been allowed to dry for 3 minutes.  Her arms were tucked to her side with all appropriate precautions.  The shoulders were stabilized with padded shoulder blocks applied to the acromium processes.  The patient was placed in the semi-lithotomy position in [place].  The perineum was prepped with Betadine. The patient was then prepped. Foley catheter was placed.  A sterile speculum was placed in the vagina.  The cervix was grasped with a single-tooth tenaculum. 2mg  total of ICG was injected into the cervical stroma at 2 and 9 o'clock with 1cc injected at a 1cm and 59mm depth (concentration 0.5mg /ml) in all locations. The cervix was dilated with Kennon Rounds dilators.  The ZUMI uterine manipulator with a medium colpotomizer ring was placed without difficulty.  A pneum occluder balloon was placed over the manipulator.  OG tube placement was confirmed and to suction.   Next, a 5 mm skin incision was made 1 cm below the subcostal margin in the midclavicular line.  The 5 mm Optiview port and scope was used for direct entry.  Opening pressure was under 10 mm CO2.  The abdomen was insufflated and the findings were noted as above.   At this point and all points during the procedure, the patient's intra-abdominal pressure did not exceed 15 mmHg. Next, a 10 mm skin incision was made in the umbilicus and a right and left port was placed about 10 cm lateral to the robot port on the right and left side.  A fourth  arm was placed in the left lower quadrant 2 cm above and superior and medial to the anterior superior iliac spine.  All ports were placed under direct visualization.    There was dense adhesive disease in the midline abdomen between omentum, uterus and sigmoid colon. Using meticulous sharp  dissection with laparoscopic scissors the adhesions were taken down for 30 minutes. The patient was placed in steep Trendelenburg.  Bowel was folded away into the upper abdomen.  The robot was docked in the normal manner.  For an additional 20 minutes sharp adhesiolysis was performed to separate the sidmoid colon and omentum from the uterus.  The right and left peritoneum were opened parallel to the IP ligament to open the retroperitoneal spaces bilaterally. The SLN mapping was performed in bilateral pelvic basins. The para rectal and paravesical spaces were opened up entirely with careful dissection below the level of the ureters bilaterally and to the depth of the uterine artery origin in order to skeletonize the uterine "web" and ensure visualization of all parametrial channels. The para-aortic basins were carefully exposed and evaluated for isolated para-aortic SLN's. Lymphatic channels were identified travelling to the following visualized sentinel lymph node's: left proximal and distal SLN's. There was unilateral mapping on the left. These SLN's were separated from their surrounding lymphatic tissue, removed and sent for permanent pathology.  Due to unilateral mapping the right pelvic lymphadenectomy was then performed. The paravesical space was developed with monopolar and sharp dissection. It was held open with tension on the median umbilical ligament with the forth arm. The paravesical space was opened with blunt and sharp dissection to mobilize the ureter off of the medial surface of the internal iliac artery. The medial leaf of the broad ligament containing the ureter was held medially (opening the pararectal space) by the assistant's grasper. The right pelvic lymphadenectomy was performed by skeletonizing the internal iliac artery at the bifurcation with the external iliac artery. The obturator nerve was identified in the base of lateral paravesical space. The ureter was mobilized medially off of  the dissection by developing the pararectal space. The genitofemoral nerve was identified, skeletonized and mobilized laterally off of the external iliac artery. An enbloc resection of lymph nodes was performed within the following boundaries: the mid portion of the common iliac proximally, the circumflex iliac vein distally, the obturator nerve posteriorally, the genitofemoral nerve laterally. The nodal basin (including obturator space) were confirmed to be empty of nodes and hemostatic. The nodes were placed in an endocatch bag and retrieved vaginally.  The hysterectomy was started after the round ligament on the right side was incised and the retroperitoneum was entered and the pararectal space was developed.  The ureter was noted to be on the medial leaf of the broad ligament.  The peritoneum above the ureter was incised and stretched and the infundibulopelvic ligament was skeletonized, cauterized and cut.  The posterior peritoneum was taken down to the level of the KOH ring.  The anterior peritoneum was also taken down.  The bladder flap was created to the level of the KOH ring.  The uterine artery on the right side was skeletonized, cauterized and cut in the normal manner.  A similar procedure was performed on the left.  The colpotomy was made and the uterus, cervix, bilateral ovaries and tubes were amputated and delivered through the vagina.  Pedicles were inspected and excellent hemostasis was achieved.  There was a fragment of devitalized omentum still adherent to the pelvic peritoneum. It was carefully  resected with the scissors and removed.   The colpotomy at the vaginal cuff was closed with Vicryl on a CT1 needle in a running manner.  Irrigation was used and excellent hemostasis was achieved.  At this point in the procedure was completed.  The camera was inserted in a lateral port and the peri-umbilical sites of adhesiolysis were evaluated for hemostasis. Robotic instruments were removed under direct  visulaization.  The robot was undocked. The 10 mm ports were closed with Vicryl on a UR-5 needle and the fascia was closed with 0 Vicryl on a UR-5 needle.  The skin was closed with 4-0 Vicryl in a subcuticular manner.  Dermabond was applied.  Sponge, lap and needle counts correct x 2.  The patient was taken to the recovery room in stable condition.  The vagina was swabbed with  minimal bleeding noted.   All instrument and needle counts were correct x  3.   The patient was transferred to the recovery room in a stable  condition.  Donaciano Eva, MD

## 2017-06-15 NOTE — Transfer of Care (Signed)
Immediate Anesthesia Transfer of Care Note  Patient: Brittany Santiago  Procedure(s) Performed: XI ROBOTIC ASSISTED TOTAL HYSTERECTOMY WITH BILATERAL SALPINGO OOPHORECTOMY WITH SENTINAL LYMPH NODES, PELVIC LYMPHADENECTOMY, AND LYSIS OF ADHESIONS (Bilateral )  Patient Location: PACU  Anesthesia Type:General  Level of Consciousness: drowsy and patient cooperative  Airway & Oxygen Therapy: Patient Spontanous Breathing and Patient connected to face mask oxygen  Post-op Assessment: Report given to RN and Post -op Vital signs reviewed and stable  Post vital signs: Reviewed and stable  Last Vitals:  Vitals Value Taken Time  BP 128/59 06/15/2017 11:03 AM  Temp    Pulse 74 06/15/2017 11:06 AM  Resp 19 06/15/2017 11:06 AM  SpO2 98 % 06/15/2017 11:06 AM  Vitals shown include unvalidated device data.  Last Pain:  Vitals:   06/15/17 0708  TempSrc:   PainSc: 0-No pain      Patients Stated Pain Goal: 4 (70/[id number])  Complications: No apparent anesthesia complications

## 2017-06-15 NOTE — Anesthesia Procedure Notes (Signed)
Procedure Name: Intubation Date/Time: 06/15/2017 8:21 AM Performed by: Genelle Bal, CRNA Pre-anesthesia Checklist: Patient identified, Emergency Drugs available, Suction available and Patient being monitored Patient Re-evaluated:Patient Re-evaluated prior to induction Oxygen Delivery Method: Circle system utilized Preoxygenation: Pre-oxygenation with 100% oxygen Induction Type: IV induction Ventilation: Mask ventilation without difficulty Laryngoscope Size: Miller and 2 Grade View: Grade II Tube type: Oral Tube size: 7.0 mm Number of attempts: 1 Airway Equipment and Method: Stylet and Oral airway Placement Confirmation: ETT inserted through vocal cords under direct vision,  positive ETCO2 and breath sounds checked- equal and bilateral Secured at: 21 cm Tube secured with: Tape Dental Injury: Teeth and Oropharynx as per pre-operative assessment

## 2017-06-15 NOTE — Interval H&P Note (Signed)
History and Physical Interval Note:  06/15/2017 7:00 AM  Brittany Santiago  has presented today for surgery, with the diagnosis of endometrial cancer  The various methods of treatment have been discussed with the patient and family. After consideration of risks, benefits and other options for treatment, the patient has consented to  Procedure(s): XI ROBOTIC ASSISTED TOTAL HYSTERECTOMY WITH BILATERAL SALPINGO OOPHORECTOMY WITH SENTINAL LYMPH NODES (Bilateral) as a surgical intervention .  The patient's history has been reviewed, patient examined, no change in status, stable for surgery.  I have reviewed the patient's chart and labs.  Questions were answered to the patient's satisfaction.     Thereasa Solo

## 2017-06-15 NOTE — Anesthesia Postprocedure Evaluation (Signed)
Anesthesia Post Note  Patient: Brittany Santiago  Procedure(s) Performed: XI ROBOTIC ASSISTED TOTAL HYSTERECTOMY WITH BILATERAL SALPINGO OOPHORECTOMY WITH SENTINAL LYMPH NODES, PELVIC LYMPHADENECTOMY, AND LYSIS OF ADHESIONS (Bilateral )     Patient location during evaluation: PACU Anesthesia Type: General Level of consciousness: sedated and patient cooperative Pain management: pain level controlled Vital Signs Assessment: post-procedure vital signs reviewed and stable Respiratory status: spontaneous breathing Cardiovascular status: stable Anesthetic complications: no    Last Vitals:  Vitals:   06/15/17 1401 06/15/17 1402  BP: (!) 123/52   Pulse: 80   Resp: 17   Temp:  (!) 36.4 C  SpO2: 99%     Last Pain:  Vitals:   06/15/17 1402  TempSrc: Oral  PainSc:                  Nolon Nations

## 2017-06-16 ENCOUNTER — Encounter (HOSPITAL_COMMUNITY): Payer: Self-pay | Admitting: Gynecologic Oncology

## 2017-06-16 DIAGNOSIS — C541 Malignant neoplasm of endometrium: Secondary | ICD-10-CM | POA: Diagnosis not present

## 2017-06-16 DIAGNOSIS — N838 Other noninflammatory disorders of ovary, fallopian tube and broad ligament: Secondary | ICD-10-CM | POA: Diagnosis not present

## 2017-06-16 DIAGNOSIS — D251 Intramural leiomyoma of uterus: Secondary | ICD-10-CM | POA: Diagnosis not present

## 2017-06-16 DIAGNOSIS — N83201 Unspecified ovarian cyst, right side: Secondary | ICD-10-CM | POA: Diagnosis not present

## 2017-06-16 DIAGNOSIS — N83202 Unspecified ovarian cyst, left side: Secondary | ICD-10-CM | POA: Diagnosis not present

## 2017-06-16 DIAGNOSIS — I10 Essential (primary) hypertension: Secondary | ICD-10-CM | POA: Diagnosis not present

## 2017-06-16 LAB — CBC
HCT: 33.9 % — ABNORMAL LOW (ref 36.0–46.0)
HEMOGLOBIN: 11.4 g/dL — AB (ref 12.0–15.0)
MCH: 30 pg (ref 26.0–34.0)
MCHC: 33.6 g/dL (ref 30.0–36.0)
MCV: 89.2 fL (ref 78.0–100.0)
PLATELETS: 247 10*3/uL (ref 150–400)
RBC: 3.8 MIL/uL — AB (ref 3.87–5.11)
RDW: 13.4 % (ref 11.5–15.5)
WBC: 12.7 10*3/uL — ABNORMAL HIGH (ref 4.0–10.5)

## 2017-06-16 LAB — BASIC METABOLIC PANEL
Anion gap: 9 (ref 5–15)
BUN: 17 mg/dL (ref 6–20)
CALCIUM: 8.7 mg/dL — AB (ref 8.9–10.3)
CHLORIDE: 103 mmol/L (ref 101–111)
CO2: 23 mmol/L (ref 22–32)
CREATININE: 1.03 mg/dL — AB (ref 0.44–1.00)
GFR calc Af Amer: 56 mL/min — ABNORMAL LOW (ref >60)
GFR calc non Af Amer: 49 mL/min — ABNORMAL LOW (ref >60)
GLUCOSE: 169 mg/dL — AB (ref 65–99)
Potassium: 3.6 mmol/L (ref 3.5–5.1)
Sodium: 135 mmol/L (ref 135–145)

## 2017-06-16 MED ORDER — IBUPROFEN 600 MG PO TABS: 600.0000 mg | ORAL_TABLET | Freq: Four times a day (QID) | ORAL | 0 refills | Status: DC | PRN

## 2017-06-16 MED ORDER — OXYCODONE-ACETAMINOPHEN 5-325 MG PO TABS: 1.0000 | ORAL_TABLET | ORAL | 0 refills | Status: DC | PRN

## 2017-06-16 MED ORDER — SENNA 8.6 MG PO TABS: 1.0000 | ORAL_TABLET | Freq: Every day | ORAL | 0 refills | Status: DC

## 2017-06-16 NOTE — Discharge Summary (Signed)
Physician Discharge Summary  Patient ID: JHADA RISK MRN: [id number] DOB/AGE: Jul 04, 1930 84 y.o.  Admit date: 06/15/2017 Discharge date: 06/16/2017  Admission Diagnoses: <principal problem not specified>  Discharge Diagnoses:  Active Problems:   Endometrial cancer [facility])   Discharged Condition: good  Hospital Course:  1/ patient was admitted on 06/15/17 for a robotic assisted total hysterectomy, BSO, SLN biopsy and lysis of adhesions with right pelvic lymphadenectomy. 2/ surgery was uncomplicated  3/ on postoperative day 1 the patient was meeting discharge criteria: tolerating PO, voiding urine, ambulating, pain well controlled on oral medications.  4/ new medications on discharge include percocet and senakot.  She was recommended to hold aspirin for 2 weeks due to greater than expected intraoperative bleeding.  Consults: None  Significant Diagnostic Studies: labs:  CBC    Component Value Date/Time   WBC 12.7 (H) 06/16/2017 0446   RBC 3.80 (L) 06/16/2017 0446   HGB 11.4 (L) 06/16/2017 0446   HCT 33.9 (L) 06/16/2017 0446   PLT 247 06/16/2017 0446   MCV 89.2 06/16/2017 0446   MCH 30.0 06/16/2017 0446   MCHC 33.6 06/16/2017 0446   RDW 13.4 06/16/2017 0446     Treatments: surgery: see above  Discharge Exam: Blood pressure (!) 125/57, pulse 78, temperature 97.8 F (36.6 C), temperature source Oral, resp. rate 18, height 5\' 3"  (1.6 m), weight 153 lb 10.6 oz (69.7 kg), SpO2 96 %. General appearance: alert and cooperative GI: soft, non-tender; bowel sounds normal; no masses,  no organomegaly and incisions in tact and clean x 5  Disposition: Discharge disposition: 01-Home or Self Care       Discharge Instructions    (HEART FAILURE PATIENTS) Call MD:  Anytime you have any of the following symptoms: 1) 3 pound weight gain in 24 hours or 5 pounds in 1 week 2) shortness of breath, with or without a dry hacking cough 3) swelling in the hands, feet or stomach 4) if you have  to sleep on extra pillows at night in order to breathe.   Complete by:  As directed    Call MD for:  difficulty breathing, headache or visual disturbances   Complete by:  As directed    Call MD for:  extreme fatigue   Complete by:  As directed    Call MD for:  hives   Complete by:  As directed    Call MD for:  persistant dizziness or light-headedness   Complete by:  As directed    Call MD for:  persistant nausea and vomiting   Complete by:  As directed    Call MD for:  redness, tenderness, or signs of infection (pain, swelling, redness, odor or green/yellow discharge around incision site)   Complete by:  As directed    Call MD for:  severe uncontrolled pain   Complete by:  As directed    Call MD for:  temperature >100.4   Complete by:  As directed    Diet - low sodium heart healthy   Complete by:  As directed    Diet general   Complete by:  As directed    Driving Restrictions   Complete by:  As directed    No driving for 7 days or until off narcotic pain medication   Increase activity slowly   Complete by:  As directed    Remove dressing in 24 hours   Complete by:  As directed    Sexual Activity Restrictions   Complete by:  As directed  No intercourse for 6 weeks     Allergies as of 06/16/2017   No Known Allergies     Medication List    TAKE these medications   acetaminophen 500 MG tablet Commonly known as:  TYLENOL Take 500 mg by mouth daily as needed for moderate pain or headache.   ALPRAZolam 0.25 MG tablet Commonly known as:  XANAX Take 0.25 mg by mouth daily as needed for anxiety.   amLODipine 5 MG tablet Commonly known as:  NORVASC Take 5 mg by mouth at bedtime.   aspirin 81 MG tablet Take 81 mg by mouth at bedtime.   atorvastatin 80 MG tablet Commonly known as:  LIPITOR Take 80 mg by mouth at bedtime.   azelastine 0.05 % ophthalmic solution Commonly known as:  OPTIVAR Place 1 drop into both eyes daily as needed (allergies).   beclomethasone 80  MCG/ACT inhaler Commonly known as:  QVAR Inhale 2 puffs into the lungs daily.   ezetimibe 10 MG tablet Commonly known as:  ZETIA Take 10 mg by mouth daily.   Fiber Powd Take 1 Scoop by mouth daily.   ibuprofen 600 MG tablet Commonly known as:  ADVIL,MOTRIN Take 1 tablet (600 mg total) by mouth every 6 (six) hours as needed.   levothyroxine 88 MCG tablet Commonly known as:  SYNTHROID, LEVOTHROID Take 88 mcg by mouth daily before breakfast.   metoprolol tartrate 25 MG tablet Commonly known as:  LOPRESSOR Take 12.5 mg by mouth 2 (two) times daily.   multivitamin with minerals Tabs tablet Take 1 tablet by mouth daily.   nitroGLYCERIN 0.4 MG SL tablet Commonly known as:  NITROSTAT Place 1 tablet (0.4 mg total) under the tongue every 5 (five) minutes as needed for chest pain.   omeprazole 20 MG capsule Commonly known as:  PRILOSEC Take 20 mg by mouth daily.   oxyCODONE-acetaminophen 5-325 MG tablet Commonly known as:  PERCOCET/ROXICET Take 1-2 tablets by mouth every 4 (four) hours as needed for moderate pain.   senna 8.6 MG Tabs tablet Commonly known as:  SENOKOT Take 1 tablet (8.6 mg total) by mouth at bedtime.   Vitamin D3 400 units Caps Take 400 Units by mouth daily.        Signed: Thereasa Solo 06/16/2017, 11:53 AM

## 2017-06-16 NOTE — Progress Notes (Signed)
Reviewed discharge instructions and medications. Patient states understanding and has no questions at this time.

## 2017-06-16 NOTE — Discharge Instructions (Signed)
06/16/2017  Return to work: 4 weeks  Activity: 1. Be up and out of the bed during the day.  Take a nap if needed.  You may walk up steps but be careful and use the hand rail.  Stair climbing will tire you more than you think, you may need to stop part way and rest.   2. No lifting or straining for 6 weeks.  3. No driving for 1 weeks.  Do Not drive if you are taking narcotic pain medicine.  4. Shower daily.  Use soap and water on your incision and pat dry; don't rub.   5. No sexual activity and nothing in the vagina for 8 weeks.  Medications:  - Take ibuprofen and tylenol first line for pain control. Take these regularly (every 6 hours) to decrease the build up of pain.  - If necessary, for severe pain not relieved by ibuprofen, take percocet.  - While taking percocet you should take sennakot every night to reduce the likelihood of constipation. If this causes diarrhea, stop its use.  - DO NOT TAKE YOUR ASPIRIN FOR 2 WEEKS.   Diet: 1. Low sodium Heart Healthy Diet is recommended.  2. It is safe to use a laxative if you have difficulty moving your bowels.   Wound Care: 1. Keep clean and dry.  Shower daily.  Reasons to call the Doctor:   Fever - Oral temperature greater than 100.4 degrees Fahrenheit  Foul-smelling vaginal discharge  Difficulty urinating  Nausea and vomiting  Increased pain at the site of the incision that is unrelieved with pain medicine.  Difficulty breathing with or without chest pain  New calf pain especially if only on one side  Sudden, continuing increased vaginal bleeding with or without clots.   Follow-up: 1. See Everitt Amber in 3 weeks.  Contacts: For questions or concerns you should contact:  Dr. Everitt Amber at [phone] After hours and on week-ends call [phone] and ask to speak to the physician on call for Gynecologic Oncology

## 2017-06-19 ENCOUNTER — Telehealth: Payer: Self-pay

## 2017-06-19 NOTE — Telephone Encounter (Signed)
Told Ms Fregeau that all her lymphnodes were negative for cancer and that the cancer was confined to the uterus so no additional treatment needed per Joylene John, NP.

## 2017-07-03 ENCOUNTER — Encounter: Payer: Self-pay | Admitting: Gynecologic Oncology

## 2017-07-03 ENCOUNTER — Inpatient Hospital Stay: Payer: Medicare Other | Attending: Gynecologic Oncology | Admitting: Gynecologic Oncology

## 2017-07-03 VITALS — BP 150/74 | HR 78 | Temp 98.2°F | Resp 18 | Ht 63.0 in | Wt 143.0 lb

## 2017-07-03 DIAGNOSIS — Z90710 Acquired absence of both cervix and uterus: Secondary | ICD-10-CM

## 2017-07-03 DIAGNOSIS — C541 Malignant neoplasm of endometrium: Secondary | ICD-10-CM

## 2017-07-03 DIAGNOSIS — Z7189 Other specified counseling: Secondary | ICD-10-CM

## 2017-07-03 DIAGNOSIS — Z90722 Acquired absence of ovaries, bilateral: Secondary | ICD-10-CM | POA: Insufficient documentation

## 2017-07-03 NOTE — Patient Instructions (Signed)
Please notify Dr Denman George at phone number [phone] if you notice vaginal bleeding, new pelvic or abdominal pains, bloating, feeling full easy, or a change in bladder or bowel function.   Please return to see Dr Denman George in 6 months for followup.  You will see Dr Stann Mainland for follow-up in 12 months.  Your cancer was a stage I uterine (endometrial cancer) with low risk features and no additional therapy is recommended at this time.

## 2017-07-03 NOTE — Progress Notes (Signed)
Follow-up Note: Gyn-Onc  Consult was requested by Dr. Stann Mainland for the evaluation of Brittany Santiago 84 y.o. female  CC:  Chief Complaint  Patient presents with  . Endometrial cancer [facility])    Assessment/Plan:  Brittany Santiago  is a 83 y.o.  year old with stage IA grade 1 endometrial cancer.  Pathology revealed low risk factors for recurrence, therefore no adjuvant therapy is recommended according to NCCN guidelines.  I discussed risk for recurrence and typical symptoms encouraged her to notify us of these should they develop between visits.  I recommend she have follow-up every 6 months for 5 years in accordance with NCCN guidelines. Those visits should include symptom assessment, physical exam and pelvic examination. Pap smears are not indicated or recommended in the routine surveillance of endometrial cancer.  HPI: Brittany Santiago is a 84 year old P2 who is seen in consultation at the request of Dr Stann Mainland for grade 1 endometrial cancer.   The patient reports 3 episodes of vaginal bleeding in the month of March 2019.  She was seen by Dr. Alfonse Spruce for evaluation for this.  And an ultrasound was performed in March 2019.  This revealed a uterus measuring 7.8 x 4.8 x 4.1 cm with a thickened endometrial thickness of 26 mm.  The ovaries appeared within normal limits and no free fluid was seen.  Due to endometrial thickness she underwent an endometrial [place] biopsy on 05/12/2017.  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  The patient is otherwise fairly healthy.  She does however have a history of a coronary artery bypass graft surgery with 2 vessel blockage in 2017.  This was performed at [facility].  Her cardiologist is Dr. Tish Frederickson.  Since that procedure she has done extremely well.  She can climb a flight of stairs without chest pain or shortness of breath.  She has no edema.  She has no  dyspnea.  She can lay flat to sleep.  She was recently seen by her cardiologist in January 2019 with a clean bill of health.  She takes 81 mg of aspirin.  She also takes a beta-blocker.  Her surgical history abdominally is significant for 2 prior cesarean sections.  She has had urinary incontinence treated with 3 vaginal procedures including 2 injections in 1 pubovaginal sling.  She continues to have stress urinary incontinence.  Her family history significant for a brother with kidney cancer in his 66s and another brother with leukemia in his 6s.  Her father had prostate cancer.  Interval Hx: On 06/15/17 she underwent robotic assisted total hysterectomy, BSO and SLN biopsy with right pelvic lymphadenopathy due to failed unilateral mapping. The procedure was uncomplicated. Postoperative pathology revealed a 2.6 cm grade 1 endometrioid adenocarcinoma with inner half myometrial invasion (3 mm of 14 mm).  There was no lymphovascular space invasion present nor cervical or adnexal involvement.  Sentinel lymph nodes on the left were negative and the right pelvic lymph node dissection on the right was also negative for metastatic carcinoma.  Since surgery she is doing well, with no complaints. She has had no bleeding.   Current Meds:  Outpatient Encounter Medications as of 07/03/2017  Medication Sig  . acetaminophen (TYLENOL) 500 MG tablet Take 500 mg by mouth daily as needed for moderate pain or headache.  . ALPRAZolam (XANAX) 0.25 MG tablet Take 0.25 mg by mouth daily as needed for  anxiety.   Marland Kitchen amLODipine (NORVASC) 5 MG tablet Take 5 mg by mouth at bedtime.   Marland Kitchen aspirin 81 MG tablet Take 81 mg by mouth at bedtime.   Marland Kitchen atorvastatin (LIPITOR) 80 MG tablet Take 80 mg by mouth at bedtime.   . beclomethasone (QVAR) 80 MCG/ACT inhaler Inhale 2 puffs into the lungs daily.  . Cholecalciferol (VITAMIN D3) 400 units CAPS Take 400 Units by mouth daily.  Marland Kitchen ezetimibe (ZETIA) 10 MG tablet Take 10 mg by mouth  daily.   . Fiber POWD Take 1 Scoop by mouth daily.  Marland Kitchen ibuprofen (ADVIL,MOTRIN) 600 MG tablet Take 1 tablet (600 mg total) by mouth every 6 (six) hours as needed.  Marland Kitchen levothyroxine (SYNTHROID, LEVOTHROID) 88 MCG tablet Take 88 mcg by mouth daily before breakfast.   . metoprolol tartrate (LOPRESSOR) 25 MG tablet Take 12.5 mg by mouth 2 (two) times daily.   . Multiple Vitamin (MULTIVITAMIN WITH MINERALS) TABS tablet Take 1 tablet by mouth daily.  Marland Kitchen omeprazole (PRILOSEC) 20 MG capsule Take 20 mg by mouth daily.  Marland Kitchen senna (SENOKOT) 8.6 MG TABS tablet Take 1 tablet (8.6 mg total) by mouth at bedtime.  . nitroGLYCERIN (NITROSTAT) 0.4 MG SL tablet Place 1 tablet (0.4 mg total) under the tongue every 5 (five) minutes as needed for chest pain.  . [DISCONTINUED] azelastine (OPTIVAR) 0.05 % ophthalmic solution Place 1 drop into both eyes daily as needed (allergies).  . [DISCONTINUED] oxyCODONE-acetaminophen (PERCOCET/ROXICET) 5-325 MG tablet Take 1-2 tablets by mouth every 4 (four) hours as needed for moderate pain. (Patient not taking: Reported on 07/03/2017)   No facility-administered encounter medications on file as of 07/03/2017.     Allergy:  No Known Allergies  Social Hx:   Social History   Socioeconomic History  . Marital status: Married    Spouse name: Not on file  . Number of children: Not on file  . Years of education: Not on file  . Highest education level: Not on file  Occupational History  . Not on file  Social Needs  . Financial resource strain: Not on file  . Food insecurity:    Worry: Not on file    Inability: Not on file  . Transportation needs:    Medical: Not on file    Non-medical: Not on file  Tobacco Use  . Smoking status: Never Smoker  . Smokeless tobacco: Never Used  Substance and Sexual Activity  . Alcohol use: No  . Drug use: No  . Sexual activity: Not on file  Lifestyle  . Physical activity:    Days per week: Not on file    Minutes per session: Not on file   . Stress: Not on file  Relationships  . Social connections:    Talks on phone: Not on file    Gets together: Not on file    Attends religious service: Not on file    Active member of club or organization: Not on file    Attends meetings of clubs or organizations: Not on file    Relationship status: Not on file  . Intimate partner violence:    Fear of current or ex partner: Not on file    Emotionally abused: Not on file    Physically abused: Not on file    Forced sexual activity: Not on file  Other Topics Concern  . Not on file  Social History Narrative   Lives alone,retired.   Husband deceased    Past Surgical Hx:  Past  Surgical History:  Procedure Laterality Date  . APPENDECTOMY     yaken with first c section   . CESAREAN SECTION     x2   . CORONARY ARTERY BYPASS GRAFT  02/2015   DOUBLE BYPASS ; DONE AT  [place]    . EYE SURGERY  1999   bilateral cataract extraction   . JOINT REPLACEMENT Right    hip  . KNEE ARTHROSCOPY Left   . neck back surgery  2009  . ROBOTIC ASSISTED TOTAL HYSTERECTOMY WITH BILATERAL SALPINGO OOPHERECTOMY Bilateral 06/15/2017   Procedure: XI ROBOTIC ASSISTED TOTAL HYSTERECTOMY WITH BILATERAL SALPINGO OOPHORECTOMY WITH SENTINAL LYMPH NODES, PELVIC LYMPHADENECTOMY, AND LYSIS OF ADHESIONS;  Surgeon: Everitt Amber, MD;  Location: [facility];  Service: Gynecology;  Laterality: Bilateral;  . ROTATOR CUFF REPAIR Left   . THYROID SURGERY    . THYROIDECTOMY  2000s    Past Medical Hx:  Past Medical History:  Diagnosis Date  . Asthma, moderate persistent 08/02/2013   6/9/2015[place]: normal CXR normal  ; DENIS , STATES I HAD BRONCHITIS BEFORE AND USED AN INHALER TEMPORARILY   . Cancer [facility])    endometrial   . Coronary atherosclerosis of native coronary artery 03/16/2015  . Hyperlipidemia   . Hypertension   . Hypothyroidism   . Left bundle branch block (LBBB) 03/04/2017  . 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 Seasonal allergies   . Thyroid disease     Past  Gynecological History:  C/s x 2 No LMP recorded. Patient is postmenopausal.  Family Hx:  Family History  Problem Relation Age of Onset  . Heart failure Mother   . Depression Father        commited suicide  . Kidney cancer Brother   . Leukemia Brother     Review of Systems:  Constitutional  Feels well,    ENT Normal appearing ears and nares bilaterally Skin/Breast  No rash, sores, jaundice, itching, dryness Cardiovascular  No chest pain, shortness of breath, or edema  Pulmonary  No cough or wheeze.  Gastro Intestinal  No nausea, vomitting, or diarrhoea. No bright red blood per rectum, no abdominal pain, change in bowel movement, or constipation.  Genito Urinary  No frequency, urgency, dysuria, + postmenopausal bleeding Musculo Skeletal  No myalgia, arthralgia, joint swelling or pain  Neurologic  No weakness, numbness, change in gait,  Psychology  No depression, anxiety, insomnia.   Vitals:  Blood pressure (!) 150/74, pulse 78, temperature 98.2 F (36.8 C), temperature source Oral, resp. rate 18, height 5\' 3"  (1.6 m), weight 143 lb (64.9 kg), SpO2 99 %.  Physical Exam: WD in NAD Neck  Supple NROM, without any enlargements.  Lymph Node Survey No cervical supraclavicular or inguinal adenopathy Cardiovascular  Pulse normal rate, regularity and rhythm. S1 and S2 normal.  Lungs  Clear to auscultation bilateraly, without wheezes/crackles/rhonchi. Good air movement.  Skin  No rash/lesions/breakdown  Psychiatry  Alert and oriented to person, place, and time  Abdomen  Normoactive bowel sounds, abdomen soft, non-tender and obese without evidence of hernia.  Back No CVA tenderness Genito Urinary  Vulva/vagina: Normal external female genitalia.   No lesions. No discharge or bleeding.  Bladder/urethra:  No lesions or masses, prolapsed bladder  Vagina: atrophic, normal, some prolapse  Cervix: Normal appearing, no lesions.  Uterus:  Small, mobile, no parametrial  involvement or nodularity.  Adnexa: no palpable masses. Rectal  deferred  Extremities  No bilateral cyanosis, clubbing or edema.   20 minutes of direct  face to face counseling time was spent with the patient. This included discussion about prognosis, therapy recommendations and postoperative side effects and are beyond the scope of routine postoperative care.   Thereasa Solo, MD  07/03/2017, 5:05 PM

## 2017-08-12 DIAGNOSIS — I10 Essential (primary) hypertension: Secondary | ICD-10-CM | POA: Diagnosis not present

## 2017-09-02 DIAGNOSIS — I10 Essential (primary) hypertension: Secondary | ICD-10-CM | POA: Diagnosis not present

## 2017-09-02 DIAGNOSIS — F419 Anxiety disorder, unspecified: Secondary | ICD-10-CM | POA: Diagnosis not present

## 2017-09-02 DIAGNOSIS — R51 Headache: Secondary | ICD-10-CM | POA: Diagnosis not present

## 2017-09-05 DIAGNOSIS — R51 Headache: Secondary | ICD-10-CM | POA: Diagnosis not present

## 2017-09-17 DIAGNOSIS — I6523 Occlusion and stenosis of bilateral carotid arteries: Secondary | ICD-10-CM | POA: Diagnosis not present

## 2017-09-17 DIAGNOSIS — I639 Cerebral infarction, unspecified: Secondary | ICD-10-CM | POA: Diagnosis not present

## 2017-10-15 DIAGNOSIS — Z961 Presence of intraocular lens: Secondary | ICD-10-CM | POA: Diagnosis not present

## 2017-10-15 DIAGNOSIS — H04123 Dry eye syndrome of bilateral lacrimal glands: Secondary | ICD-10-CM | POA: Diagnosis not present

## 2017-11-18 DIAGNOSIS — E785 Hyperlipidemia, unspecified: Secondary | ICD-10-CM | POA: Diagnosis not present

## 2017-11-18 DIAGNOSIS — I2510 Atherosclerotic heart disease of native coronary artery without angina pectoris: Secondary | ICD-10-CM | POA: Diagnosis not present

## 2017-11-18 DIAGNOSIS — E890 Postprocedural hypothyroidism: Secondary | ICD-10-CM | POA: Diagnosis not present

## 2017-11-18 DIAGNOSIS — E559 Vitamin D deficiency, unspecified: Secondary | ICD-10-CM | POA: Diagnosis not present

## 2017-11-18 DIAGNOSIS — Z23 Encounter for immunization: Secondary | ICD-10-CM | POA: Diagnosis not present

## 2017-11-18 DIAGNOSIS — I10 Essential (primary) hypertension: Secondary | ICD-10-CM | POA: Diagnosis not present

## 2017-11-20 DIAGNOSIS — M1712 Unilateral primary osteoarthritis, left knee: Secondary | ICD-10-CM | POA: Diagnosis not present

## 2017-11-20 DIAGNOSIS — M25562 Pain in left knee: Secondary | ICD-10-CM | POA: Diagnosis not present

## 2017-12-04 ENCOUNTER — Ambulatory Visit (INDEPENDENT_AMBULATORY_CARE_PROVIDER_SITE_OTHER): Payer: Medicare Other | Admitting: Cardiology

## 2017-12-04 ENCOUNTER — Encounter: Payer: Self-pay | Admitting: Cardiology

## 2017-12-04 VITALS — BP 130/68 | HR 56 | Ht 63.0 in | Wt 135.0 lb

## 2017-12-04 DIAGNOSIS — E785 Hyperlipidemia, unspecified: Secondary | ICD-10-CM | POA: Diagnosis not present

## 2017-12-04 DIAGNOSIS — I10 Essential (primary) hypertension: Secondary | ICD-10-CM

## 2017-12-04 DIAGNOSIS — I2510 Atherosclerotic heart disease of native coronary artery without angina pectoris: Secondary | ICD-10-CM | POA: Diagnosis not present

## 2017-12-04 DIAGNOSIS — Z951 Presence of aortocoronary bypass graft: Secondary | ICD-10-CM | POA: Diagnosis not present

## 2017-12-04 NOTE — Patient Instructions (Signed)
Medication Instructions:  Your physician recommends that you continue on your current medications as directed. Please refer to the Current Medication list given to you today.  If you need a refill on your cardiac medications before your next appointment, please call your pharmacy.   Lab work: None  If you have labs (blood work) drawn today and your tests are completely normal, you will receive your results only by: . MyChart Message (if you have MyChart) OR . A paper copy in the mail If you have any lab test that is abnormal or we need to change your treatment, we will call you to review the results.  Testing/Procedures: None  Follow-Up: At [facility], you and your health needs are our priority.  As part of our continuing mission to provide you with exceptional heart care, we have created designated Provider Care Teams.  These Care Teams include your primary Cardiologist (physician) and Advanced Practice Providers (APPs -  Physician Assistants and Nurse Practitioners) who all work together to provide you with the care you need, when you need it.  You will need a follow up appointment in 6 months.  Please call our office 2 months in advance to schedule this appointment.  You may see another member of our [facility] Provider Team in [place]: Robert Krasowski, MD . Brian Munley, MD  Any Other Special Instructions Will Be Listed Below (If Applicable).    

## 2017-12-04 NOTE — Progress Notes (Signed)
Cardiology Office Note:    Date:  12/04/2017   ID:  Brittany Santiago, DOB [birth year], MRN [id number]  PCP:  Nicoletta Dress, MD  Cardiologist:  Jenean Lindau, MD   Referring MD: Nicoletta Dress, MD    ASSESSMENT:    1. Coronary artery disease involving native coronary artery of native heart without angina pectoris   2. Dyslipidemia   3. S/P CABG x 2   4. Essential hypertension    PLAN:    In order of problems listed above:  1. Secondary prevention stressed with the patient.  Importance of compliance with diet and medication stressed and she vocalized understanding.  Her blood pressure is stable. 2. Diet was discussed for dyslipidemia.  I reviewed her lab work and it is fine from a lipid perspective.  View of the lab was done in September were available to me. 3. Patient will be seen in follow-up appointment in 6 months or earlier if the patient has any concerns    Medication Adjustments/Labs and Tests Ordered: Current medicines are reviewed at length with the patient today.  Concerns regarding medicines are outlined above.  No orders of the defined types were placed in this encounter.  No orders of the defined types were placed in this encounter.    No chief complaint on file.    History of Present Illness:    Brittany Santiago is a 84 y.o. female.  The patient has known coronary artery disease.  She has had CABG surgery in the past.  She denies any problems at this time and takes care of activities of daily living.  No chest pain orthopnea or PND.  She is undergone her surgery and it went well.  She is not exercising much because of knee problems.  At the time of my evaluation, the patient is alert awake oriented and in no distress.  Past Medical History:  Diagnosis Date  . Asthma, moderate persistent 08/02/2013   6/9/2015[facility]: normal CXR normal  ; DENIS , STATES I HAD BRONCHITIS BEFORE AND USED AN INHALER TEMPORARILY   . Cancer [facility])    endometrial   . Coronary  atherosclerosis of native coronary artery 03/16/2015  . Hyperlipidemia   . Hypertension   . Hypothyroidism   . Left bundle branch block (LBBB) 03/04/2017  . 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 Seasonal allergies   . Thyroid disease     Past Surgical History:  Procedure Laterality Date  . APPENDECTOMY     yaken with first c section   . CESAREAN SECTION     x2   . CORONARY ARTERY BYPASS GRAFT  02/2015   DOUBLE BYPASS ; DONE AT  [place]    . EYE SURGERY  1999   bilateral cataract extraction   . JOINT REPLACEMENT Right    hip  . KNEE ARTHROSCOPY Left   . neck back surgery  2009  . ROBOTIC ASSISTED TOTAL HYSTERECTOMY WITH BILATERAL SALPINGO OOPHERECTOMY Bilateral 06/15/2017   Procedure: XI ROBOTIC ASSISTED TOTAL HYSTERECTOMY WITH BILATERAL SALPINGO OOPHORECTOMY WITH SENTINAL LYMPH NODES, PELVIC LYMPHADENECTOMY, AND LYSIS OF ADHESIONS;  Surgeon: Everitt Amber, MD;  Location: [facility];  Service: Gynecology;  Laterality: Bilateral;  . ROTATOR CUFF REPAIR Left   . THYROID SURGERY    . THYROIDECTOMY  2000s    Current Medications: Current Meds  Medication Sig  . acetaminophen (TYLENOL) 500 MG tablet Take 500 mg by mouth daily as needed for moderate pain or headache.  Marland Kitchen  ALPRAZolam (XANAX) 0.25 MG tablet Take 0.25 mg by mouth daily as needed for anxiety.   Marland Kitchen amLODipine (NORVASC) 5 MG tablet Take 5 mg by mouth at bedtime.   Marland Kitchen aspirin 81 MG tablet Take 81 mg by mouth at bedtime.   Marland Kitchen atorvastatin (LIPITOR) 80 MG tablet Take 80 mg by mouth at bedtime.   . beclomethasone (QVAR) 80 MCG/ACT inhaler Inhale 2 puffs into the lungs daily.  . Cholecalciferol (VITAMIN D3) 400 units CAPS Take 400 Units by mouth daily.  Marland Kitchen ezetimibe (ZETIA) 10 MG tablet Take 10 mg by mouth daily.   . Fiber POWD Take 1 Scoop by mouth daily.  Marland Kitchen ibuprofen (ADVIL,MOTRIN) 600 MG tablet Take 1 tablet (600 mg total) by mouth every 6 (six) hours as needed.  Marland Kitchen levothyroxine (SYNTHROID, LEVOTHROID) 88 MCG tablet Take 88 mcg by mouth  daily before breakfast.   . metoprolol tartrate (LOPRESSOR) 25 MG tablet Take 12.5 mg by mouth 2 (two) times daily.   . Multiple Vitamin (MULTIVITAMIN WITH MINERALS) TABS tablet Take 1 tablet by mouth daily.  Marland Kitchen omeprazole (PRILOSEC) 20 MG capsule Take 20 mg by mouth daily.  Marland Kitchen senna (SENOKOT) 8.6 MG TABS tablet Take 1 tablet (8.6 mg total) by mouth at bedtime.     Allergies:   Patient has no known allergies.   Social History   Socioeconomic History  . Marital status: Married    Spouse name: Not on file  . Number of children: Not on file  . Years of education: Not on file  . Highest education level: Not on file  Occupational History  . Not on file  Social Needs  . Financial resource strain: Not on file  . Food insecurity:    Worry: Not on file    Inability: Not on file  . Transportation needs:    Medical: Not on file    Non-medical: Not on file  Tobacco Use  . Smoking status: Never Smoker  . Smokeless tobacco: Never Used  Substance and Sexual Activity  . Alcohol use: No  . Drug use: No  . Sexual activity: Not on file  Lifestyle  . Physical activity:    Days per week: Not on file    Minutes per session: Not on file  . Stress: Not on file  Relationships  . Social connections:    Talks on phone: Not on file    Gets together: Not on file    Attends religious service: Not on file    Active member of club or organization: Not on file    Attends meetings of clubs or organizations: Not on file    Relationship status: Not on file  Other Topics Concern  . Not on file  Social History Narrative   Lives alone,retired.   Husband deceased     Family History: The patient's family history includes Depression in her father; Heart failure in her mother; Kidney cancer in her brother; Leukemia in her brother.  ROS:   Please see the history of present illness.    All other systems reviewed and are negative.  EKGs/Labs/Other Studies Reviewed:    The following studies were  reviewed today: I discussed my findings with the patient at extensive time.   Recent Labs: 06/10/2017: ALT 30 06/16/2017: BUN 17; Creatinine, Ser 1.03; Hemoglobin 11.4; Platelets 247; Potassium 3.6; Sodium 135  Recent Lipid Panel No results found for: CHOL, TRIG, HDL, CHOLHDL, VLDL, LDLCALC, LDLDIRECT  Physical Exam:    VS:  BP 130/68 (BP  Location: Right Arm, Patient Position: Sitting, Cuff Size: Normal)   Pulse (!) 56   Ht 5\' 3"  (1.6 m)   Wt 135 lb (61.2 kg)   SpO2 98%   BMI 23.91 kg/m     Wt Readings from Last 3 Encounters:  12/04/17 135 lb (61.2 kg)  07/03/17 143 lb (64.9 kg)  06/16/17 153 lb 10.6 oz (69.7 kg)     GEN: Patient is in no acute distress HEENT: Normal NECK: No JVD; No carotid bruits LYMPHATICS: No lymphadenopathy CARDIAC: Hear sounds regular, 2/6 systolic murmur at the apex. RESPIRATORY:  Clear to auscultation without rales, wheezing or rhonchi  ABDOMEN: Soft, non-tender, non-distended MUSCULOSKELETAL:  No edema; No deformity  SKIN: Warm and dry NEUROLOGIC:  Alert and oriented x 3 PSYCHIATRIC:  Normal affect   Signed, Jenean Lindau, MD  12/04/2017 9:49 AM    [place]

## 2017-12-29 ENCOUNTER — Telehealth: Payer: Self-pay | Admitting: *Deleted

## 2017-12-29 NOTE — Telephone Encounter (Signed)
Patient called and needs to move her 11/15 appt to earlier in the month. Per patient "My GYN needs to have Dr. Denman George to ok to have surgery for my vaginal prolapse."

## 2017-12-30 ENCOUNTER — Encounter: Payer: Self-pay | Admitting: Gynecologic Oncology

## 2017-12-30 ENCOUNTER — Inpatient Hospital Stay: Payer: Medicare Other | Attending: Gynecologic Oncology | Admitting: Gynecologic Oncology

## 2017-12-30 VITALS — BP 152/64 | HR 80 | Temp 98.0°F | Resp 20 | Ht 62.0 in | Wt 140.1 lb

## 2017-12-30 DIAGNOSIS — Z90710 Acquired absence of both cervix and uterus: Secondary | ICD-10-CM | POA: Diagnosis not present

## 2017-12-30 DIAGNOSIS — R879 Unspecified abnormal finding in specimens from female genital organs: Secondary | ICD-10-CM | POA: Diagnosis not present

## 2017-12-30 DIAGNOSIS — C541 Malignant neoplasm of endometrium: Secondary | ICD-10-CM | POA: Insufficient documentation

## 2017-12-30 DIAGNOSIS — K469 Unspecified abdominal hernia without obstruction or gangrene: Secondary | ICD-10-CM

## 2017-12-30 DIAGNOSIS — N993 Prolapse of vaginal vault after hysterectomy: Secondary | ICD-10-CM | POA: Diagnosis not present

## 2017-12-30 DIAGNOSIS — N898 Other specified noninflammatory disorders of vagina: Secondary | ICD-10-CM | POA: Diagnosis not present

## 2017-12-30 DIAGNOSIS — Z08 Encounter for follow-up examination after completed treatment for malignant neoplasm: Secondary | ICD-10-CM | POA: Diagnosis present

## 2017-12-30 DIAGNOSIS — N815 Vaginal enterocele: Secondary | ICD-10-CM

## 2017-12-30 DIAGNOSIS — Z90722 Acquired absence of ovaries, bilateral: Secondary | ICD-10-CM | POA: Insufficient documentation

## 2017-12-30 DIAGNOSIS — Z8542 Personal history of malignant neoplasm of other parts of uterus: Secondary | ICD-10-CM | POA: Insufficient documentation

## 2017-12-30 HISTORY — DX: Vaginal enterocele: N81.5

## 2017-12-30 HISTORY — DX: Unspecified abdominal hernia without obstruction or gangrene: K46.9

## 2017-12-30 MED ORDER — CIPROFLOXACIN HCL 250 MG PO TABS: 250.0000 mg | ORAL_TABLET | Freq: Two times a day (BID) | ORAL | 0 refills | Status: DC

## 2017-12-30 MED ORDER — ESTROGENS, CONJUGATED 0.625 MG/GM VA CREA: 1.0000 | TOPICAL_CREAM | Freq: Every day | VAGINAL | 1 refills | Status: AC

## 2017-12-30 MED ORDER — METRONIDAZOLE 500 MG PO TABS: 500.0000 mg | ORAL_TABLET | Freq: Three times a day (TID) | ORAL | 0 refills | Status: DC

## 2017-12-30 NOTE — Addendum Note (Signed)
Addended by: Baruch Merl on: 12/30/2017 05:16 PM   Modules accepted: Orders

## 2017-12-30 NOTE — Patient Instructions (Signed)
Dr Denman George took a biopsy of the top of the vagina and this will be resulted in 5-7 business days. Her office will call you with the result. She has sampled some of the fluid to the lab to be tested.  If you have ongoing gushes of fluid from the vagina, please notify Dr Denman George at [phone].  Dr Denman George will see you next week to check healing at the biopsy site.  She recommends that you take 2 antibiotics (cipro and flagyl) for 1 week. She recommends that you use vaginal estrogen cream at night for a month to help with healing.

## 2017-12-30 NOTE — Progress Notes (Signed)
Follow-up Note: Gyn-Onc  Consult was initially requested by Dr. Stann Mainland for the evaluation of Brittany Santiago 84 y.o. female  CC:  Chief Complaint  Patient presents with  . Endometrial cancer [facility])    Assessment/Plan:  Brittany. Brittany Santiago  is a 84 y.o.  year old with a history of stage IA grade 1 endometrial cancer, s/p staging procedure in April, 2019. Low risk factors and therefore no adjuvant therapy recommended. She has significant pelvic organ prolapse with cystocele and possible enterocele. There is a nodularity to the vaginal cuff. Biopsied today. Fluid return after biopsy concerning for peritoneal fluid vs urine. 1. Have ordered creatinine of a sample of the fluid from the vagina. 2. Will send the biopsy for pathology to rule out recurrence. 3. Empiric cipro and flagyl orally for 1 week to decrease risk for infection.  4. Recheck biopsy site in 1 week to assess for healing 5. Vaginal premarin for 1 month to assist with healing  If she heals well from this biopsy and there is benign pathology I think she would be a candidate for vaginal estrogen (safe in the setting of a low risk, resected endometrial cancer) and pessary. Though given the extremely atrophic tissue at the cuff/enterocele/cystocele, she may benefit from a surgical procedure first.   I recommend she have continue follow-up every 6 months for 5 years in accordance with NCCN guidelines. Those visits should include symptom assessment, physical exam and pelvic examination. Pap smears are not indicated or recommended in the routine surveillance of endometrial cancer.  HPI: Brittany Santiago is a 84 year old P2 who is seen in consultation at the request of Dr Stann Mainland for grade 1 endometrial cancer.   The patient reports 3 episodes of vaginal bleeding in the month of March 2019.  She was seen by Dr. Alfonse Spruce for evaluation for this.  And an ultrasound was performed in March 2019.  This revealed a uterus measuring 7.8 x 4.8 x 4.1 cm with a  thickened endometrial thickness of 26 mm.  The ovaries appeared within normal limits and no free fluid was seen.  Due to endometrial thickness she underwent an endometrial [place] biopsy on 05/12/2017.  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  The patient is otherwise fairly healthy.  She does however have a history of a coronary artery bypass graft surgery with 2 vessel blockage in 2017.  This was performed at [facility].  Her cardiologist is Dr. Tish Frederickson.  Since that procedure she has done extremely well.  She can climb a flight of stairs without chest pain or shortness of breath.  She has no edema.  She has no dyspnea.  She can lay flat to sleep.  She was recently seen by her cardiologist in January 2019 with a clean bill of health.  She takes 81 mg of aspirin.  She also takes a beta-blocker.  Her surgical history abdominally is significant for 2 prior cesarean sections.  She has had urinary incontinence treated with 3 vaginal procedures including 2 injections in 1 pubovaginal sling.  She continues to have stress urinary incontinence.  Her family history significant for a brother with kidney cancer in his 18s and another brother with leukemia in his 37s.  Her father had prostate cancer.  On 06/15/17 she underwent robotic assisted total hysterectomy, BSO and SLN biopsy with right pelvic lymphadenopathy due to failed unilateral mapping. The procedure was  uncomplicated. Postoperative pathology revealed a 2.6 cm grade 1 endometrioid adenocarcinoma with inner half myometrial invasion (3 mm of 14 mm).  There was no lymphovascular space invasion present nor cervical or adnexal involvement.  Sentinel lymph nodes on the left were negative and the right pelvic lymph node dissection on the right was also negative for metastatic carcinoma. Given the low risk features on final pathology, no adjuvant therapy  was recommended in accordance with NCCN guidelines.  Interval Hx: Since surgery she has had progressive cystocele symptoms. She saw Dr Stann Mainland in October, 2019 and felt she might be a candidate for a pessary, but wanted an evaluation by gyn onc prior to placing that.   Current Meds:  Outpatient Encounter Medications as of 12/30/2017  Medication Sig  . acetaminophen (TYLENOL) 500 MG tablet Take 500 mg by mouth daily as needed for moderate pain or headache.  . ALPRAZolam (XANAX) 0.25 MG tablet Take 0.25 mg by mouth daily as needed for anxiety.   Marland Kitchen amLODipine (NORVASC) 5 MG tablet Take 5 mg by mouth at bedtime.   Marland Kitchen aspirin 81 MG tablet Take 81 mg by mouth at bedtime.   Marland Kitchen atorvastatin (LIPITOR) 80 MG tablet Take 80 mg by mouth at bedtime.   . Cholecalciferol (VITAMIN D3) 400 units CAPS Take 400 Units by mouth daily.  Marland Kitchen ezetimibe (ZETIA) 10 MG tablet Take 10 mg by mouth daily.   . Fiber POWD Take 1 Scoop by mouth daily.  Marland Kitchen ibuprofen (ADVIL,MOTRIN) 600 MG tablet Take 1 tablet (600 mg total) by mouth every 6 (six) hours as needed.  Marland Kitchen levothyroxine (SYNTHROID, LEVOTHROID) 88 MCG tablet Take 88 mcg by mouth daily before breakfast.   . lisinopril (PRINIVIL,ZESTRIL) 10 MG tablet Take 1 tablet by mouth daily.  . metoprolol tartrate (LOPRESSOR) 50 MG tablet Take 50 mg by mouth daily.   . Multiple Vitamin (MULTIVITAMIN WITH MINERALS) TABS tablet Take 1 tablet by mouth daily.  Marland Kitchen omeprazole (PRILOSEC) 20 MG capsule Take 20 mg by mouth daily.  . beclomethasone (QVAR) 80 MCG/ACT inhaler Inhale 2 puffs into the lungs daily. (Patient not taking: Reported on 12/30/2017)  . ciprofloxacin (CIPRO) 250 MG tablet Take 1 tablet (250 mg total) by mouth 2 (two) times daily.  Marland Kitchen conjugated estrogens (PREMARIN) vaginal cream Place 1 Applicatorful vaginally at bedtime for 28 days.  . metroNIDAZOLE (FLAGYL) 500 MG tablet Take 1 tablet (500 mg total) by mouth 3 (three) times daily.  . nitroGLYCERIN (NITROSTAT) 0.4 MG SL tablet  Place 1 tablet (0.4 mg total) under the tongue every 5 (five) minutes as needed for chest pain.  . [DISCONTINUED] senna (SENOKOT) 8.6 MG TABS tablet Take 1 tablet (8.6 mg total) by mouth at bedtime. (Patient not taking: Reported on 12/30/2017)   No facility-administered encounter medications on file as of 12/30/2017.     Allergy:  No Known Allergies  Social Hx:   Social History   Socioeconomic History  . Marital status: Married    Spouse name: Not on file  . Number of children: Not on file  . Years of education: Not on file  . Highest education level: Not on file  Occupational History  . Not on file  Social Needs  . Financial resource strain: Not on file  . Food insecurity:    Worry: Not on file    Inability: Not on file  . Transportation needs:    Medical: Not on file    Non-medical: Not on file  Tobacco Use  . Smoking  status: Never Smoker  . Smokeless tobacco: Never Used  Substance and Sexual Activity  . Alcohol use: No  . Drug use: No  . Sexual activity: Not on file  Lifestyle  . Physical activity:    Days per week: Not on file    Minutes per session: Not on file  . Stress: Not on file  Relationships  . Social connections:    Talks on phone: Not on file    Gets together: Not on file    Attends religious service: Not on file    Active member of club or organization: Not on file    Attends meetings of clubs or organizations: Not on file    Relationship status: Not on file  . Intimate partner violence:    Fear of current or ex partner: Not on file    Emotionally abused: Not on file    Physically abused: Not on file    Forced sexual activity: Not on file  Other Topics Concern  . Not on file  Social History Narrative   Lives alone,retired.   Husband deceased    Past Surgical Hx:  Past Surgical History:  Procedure Laterality Date  . APPENDECTOMY     yaken with first c section   . CESAREAN SECTION     x2   . CORONARY ARTERY BYPASS GRAFT  02/2015   DOUBLE  BYPASS ; DONE AT  [place]    . EYE SURGERY  1999   bilateral cataract extraction   . JOINT REPLACEMENT Right    hip  . KNEE ARTHROSCOPY Left   . neck back surgery  2009  . ROBOTIC ASSISTED TOTAL HYSTERECTOMY WITH BILATERAL SALPINGO OOPHERECTOMY Bilateral 06/15/2017   Procedure: XI ROBOTIC ASSISTED TOTAL HYSTERECTOMY WITH BILATERAL SALPINGO OOPHORECTOMY WITH SENTINAL LYMPH NODES, PELVIC LYMPHADENECTOMY, AND LYSIS OF ADHESIONS;  Surgeon: Everitt Amber, MD;  Location: [facility];  Service: Gynecology;  Laterality: Bilateral;  . ROTATOR CUFF REPAIR Left   . THYROID SURGERY    . THYROIDECTOMY  2000s    Past Medical Hx:  Past Medical History:  Diagnosis Date  . Asthma, moderate persistent 08/02/2013   6/9/2015[facility]ro: normal CXR normal  ; DENIS , STATES I HAD BRONCHITIS BEFORE AND USED AN INHALER TEMPORARILY   . Cancer [facility])    endometrial   . Coronary atherosclerosis of native coronary artery 03/16/2015  . Female bladder prolapse   . Hyperlipidemia   . Hypertension   . Hypothyroidism   . Left bundle branch block (LBBB) 03/04/2017  . 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 Seasonal allergies   . Thyroid disease     Past Gynecological History:  C/s x 2 No LMP recorded. Patient is postmenopausal.  Family Hx:  Family History  Problem Relation Age of Onset  . Heart failure Mother   . Depression Father        commited suicide  . Kidney cancer Brother   . Leukemia Brother     Review of Systems:  Constitutional  Feels well,    ENT Normal appearing ears and nares bilaterally Skin/Breast  No rash, sores, jaundice, itching, dryness Cardiovascular  No chest pain, shortness of breath, or edema  Pulmonary  No cough or wheeze.  Gastro Intestinal  No nausea, vomitting, or diarrhoea. No bright red blood per rectum, no abdominal pain, change in bowel movement, or constipation.  Genito Urinary  No frequency, urgency, dysuria, + prolapse "bulge" Musculo Skeletal  No myalgia, arthralgia, joint  swelling or pain  Neurologic  No weakness, numbness, change in gait,  Psychology  No depression, anxiety, insomnia.   Vitals:  Blood pressure (!) 152/64, pulse 80, temperature 98 F (36.7 C), temperature source Oral, resp. rate 20, height 5\' 2"  (1.575 m), weight 140 lb 1.6 oz (63.5 kg), SpO2 98 %.  Physical Exam: WD in NAD Neck  Supple NROM, without any enlargements.  Lymph Node Survey No cervical supraclavicular or inguinal adenopathy Cardiovascular  Pulse normal rate, regularity and rhythm. S1 and S2 normal.  Lungs  Clear to auscultation bilateraly, without wheezes/crackles/rhonchi. Good air movement.  Skin  No rash/lesions/breakdown  Psychiatry  Santiago and oriented to person, place, and time  Abdomen  Normoactive bowel sounds, abdomen soft, non-tender and obese without evidence of hernia.  Back No CVA tenderness Genito Urinary  Cystocele, at vaginal apex the vaginal cuff is extremely attenuated and somewhat translucent consistent with enterocele. On digital palpation of this, the defect is easily reducible however a slight area of a nodule (<1cm) was appreciated on the posterior aspect of the defect. No overlying mucosal lesion.  Rectal  deferred  Extremities  No bilateral cyanosis, clubbing or edema.  Operative Note/Procedure Note:  Vaginal biopsy with repair of vaginal defect Preop dx: nodule on posterior vaginal wall, history of endometrial cancer Postop dx: same Procedure: vaginal biopsy with suture repair of vaginal laceration (colporrhaphy). EBL: minimal Specimens: vaginal cuff for pathology Speculum was inserted into the vagina.  The vaginal cuff with its nodular area and erythema was identified visually.  The Kevorkian biopsy forcep was used to take a piece of tissue from the posterior aspect of the vaginal cuff with care to make a superficial bite given the atrophic nature of the tissue.  Upon sampling of the tissue there was a gush of amber-colored fluid  approximately 20 cc that fill the speculum.  This was collected and sent for body fluid creatinine.  There was no active bleeding from the edges of the biopsy site however due to the apparent defect and concern for breech of the peritoneal cavity 3 interrupted sutures of 2-0 vicryl were placed to reapproximate the edges of the vaginal cuff biopsy site. There was no ongoing bleeding or drainage from the biopsy site at the completion of the procedure. The patient tolerated the procedure well.   Thereasa Solo, MD  12/30/2017, 5:02 PM

## 2017-12-31 LAB — CREATININE, BODY FLUID OTHER: Creatinine, Body Fluid: 0.7 mg/dL

## 2018-01-08 ENCOUNTER — Inpatient Hospital Stay (HOSPITAL_BASED_OUTPATIENT_CLINIC_OR_DEPARTMENT_OTHER): Payer: Medicare Other | Admitting: Gynecologic Oncology

## 2018-01-08 ENCOUNTER — Encounter: Payer: Self-pay | Admitting: Gynecologic Oncology

## 2018-01-08 VITALS — BP 163/68 | HR 70 | Temp 97.6°F | Resp 18 | Ht 62.0 in | Wt 140.7 lb

## 2018-01-08 DIAGNOSIS — Z08 Encounter for follow-up examination after completed treatment for malignant neoplasm: Secondary | ICD-10-CM

## 2018-01-08 DIAGNOSIS — K469 Unspecified abdominal hernia without obstruction or gangrene: Secondary | ICD-10-CM

## 2018-01-08 DIAGNOSIS — C541 Malignant neoplasm of endometrium: Secondary | ICD-10-CM

## 2018-01-08 DIAGNOSIS — Z90722 Acquired absence of ovaries, bilateral: Secondary | ICD-10-CM

## 2018-01-08 DIAGNOSIS — R879 Unspecified abnormal finding in specimens from female genital organs: Secondary | ICD-10-CM | POA: Diagnosis not present

## 2018-01-08 DIAGNOSIS — N898 Other specified noninflammatory disorders of vagina: Secondary | ICD-10-CM | POA: Diagnosis not present

## 2018-01-08 DIAGNOSIS — Z90710 Acquired absence of both cervix and uterus: Secondary | ICD-10-CM | POA: Diagnosis not present

## 2018-01-08 DIAGNOSIS — Z8542 Personal history of malignant neoplasm of other parts of uterus: Secondary | ICD-10-CM | POA: Diagnosis not present

## 2018-01-08 NOTE — Patient Instructions (Signed)
Continue to use the premarin for 28 days. Dr Stann Mainland may recommend that you continue beyond this time with the pessary.  Dr Stann Mainland will see you for follow-up in the spring of 2020. In the summer of 2020 contact Dr Serita Grit office to schedule follow-up with her in November, 2020.  Please notify Dr Denman George at phone number [phone] if you notice vaginal bleeding, new pelvic or abdominal pains, bloating, feeling full easy, or a change in bladder or bowel function.

## 2018-01-08 NOTE — Progress Notes (Signed)
Follow-up Note: Gyn-Onc  Consult was initially requested by Dr. Stann Mainland for the evaluation of Brittany Santiago 84 y.o. female  CC:  Chief Complaint  Patient presents with  . hx of endometrial cancer    follow-up after biopsy.    Assessment/Plan:  Brittany. Brittany Santiago  is a 84 y.o.  year old with a history of stage IA grade 1 endometrial cancer, s/p staging procedure in April, 2019. Low risk factors and therefore no adjuvant therapy recommended. She has significant pelvic organ prolapse with cystocele and possible enterocele. No recurrence on biopsy of cuff in November, 2019.  I think she would be a candidate for vaginal estrogen (safe in the setting of a low risk, resected endometrial cancer) and pessary. Though given the extremely atrophic tissue at the cuff/enterocele/cystocele, she may benefit from a surgical procedure first.   I recommend she have continue follow-up every 6 months for 5 years in accordance with NCCN guidelines. Those visits should include symptom assessment, physical exam and pelvic examination. Pap smears are not indicated or recommended in the routine surveillance of endometrial cancer.  She'll see Dr Stann Mainland in the spring of 2020 and myself in the fall of 2020.   HPI: Brittany Santiago is a 84 year old P2 who is seen in consultation at the request of Dr Stann Mainland for grade 1 endometrial cancer.   The patient reports 3 episodes of vaginal bleeding in the month of March 2019.  She was seen by Dr. Alfonse Spruce for evaluation for this.  And an ultrasound was performed in March 2019.  This revealed a uterus measuring 7.8 x 4.8 x 4.1 cm with a thickened endometrial thickness of 26 mm.  The ovaries appeared within normal limits and no free fluid was seen.  Due to endometrial thickness she underwent an endometrial [place] biopsy on 05/12/2017.  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  The patient is otherwise  fairly healthy.  She does however have a history of a coronary artery bypass graft surgery with 2 vessel blockage in 2017.  This was performed at [facility].  Her cardiologist is Dr. Tish Frederickson.  Since that procedure she has done extremely well.  She can climb a flight of stairs without chest pain or shortness of breath.  She has no edema.  She has no dyspnea.  She can lay flat to sleep.  She was recently seen by her cardiologist in January 2019 with a clean bill of health.  She takes 81 mg of aspirin.  She also takes a beta-blocker.  Her surgical history abdominally is significant for 2 prior cesarean sections.  She has had urinary incontinence treated with 3 vaginal procedures including 2 injections in 1 pubovaginal sling.  She continues to have stress urinary incontinence.  Her family history significant for a brother with kidney cancer in his 63s and another brother with leukemia in his 31s.  Her father had prostate cancer.  On 06/15/17 she underwent robotic assisted total hysterectomy, BSO and SLN biopsy with right pelvic lymphadenopathy due to failed unilateral mapping. The procedure was uncomplicated. Postoperative pathology revealed a 2.6 cm grade 1 endometrioid adenocarcinoma with inner half myometrial invasion (3 mm of 14 mm).  There was no lymphovascular space invasion present nor cervical or adnexal involvement.  Sentinel lymph nodes on the left were negative and the right pelvic lymph node dissection on the right was also negative for metastatic  carcinoma. Given the low risk features on final pathology, no adjuvant therapy was recommended in accordance with NCCN guidelines.  Interval Hx: Since surgery she has had progressive cystocele symptoms. She saw Dr Stann Mainland in October, 2019 and felt she might be a candidate for a pessary, but wanted an evaluation by gyn onc prior to placing that.  An enterocele with subtle nodularity was seen on exam on 12/30/17 which was biopsied and  noted to be benign cuff tissue. A gush of fluid returned from the biopsy site. This had normal (serum) level creatinine (and therefore was not urine).  The biopsy defect was closed with interrupted vicryl suture and she received vaginal premarin.  She returns 1 week after biopsy to re-evaluate the biopsy site.  She has had no further leakage from the vagina. She has been using premarin cream.   Current Meds:  Outpatient Encounter Medications as of 01/08/2018  Medication Sig  . acetaminophen (TYLENOL) 500 MG tablet Take 500 mg by mouth daily as needed for moderate pain or headache.  . ALPRAZolam (XANAX) 0.25 MG tablet Take 0.25 mg by mouth daily as needed for anxiety.   Marland Kitchen amLODipine (NORVASC) 5 MG tablet Take 5 mg by mouth at bedtime.   Marland Kitchen aspirin 81 MG tablet Take 81 mg by mouth at bedtime.   Marland Kitchen atorvastatin (LIPITOR) 80 MG tablet Take 80 mg by mouth at bedtime.   . beclomethasone (QVAR) 80 MCG/ACT inhaler Inhale 2 puffs into the lungs daily. (Patient not taking: Reported on 12/30/2017)  . Cholecalciferol (VITAMIN D3) 400 units CAPS Take 400 Units by mouth daily.  . ciprofloxacin (CIPRO) 250 MG tablet Take 1 tablet (250 mg total) by mouth 2 (two) times daily.  Marland Kitchen conjugated estrogens (PREMARIN) vaginal cream Place 1 Applicatorful vaginally at bedtime for 28 days.  Marland Kitchen ezetimibe (ZETIA) 10 MG tablet Take 10 mg by mouth daily.   . Fiber POWD Take 1 Scoop by mouth daily.  Marland Kitchen ibuprofen (ADVIL,MOTRIN) 600 MG tablet Take 1 tablet (600 mg total) by mouth every 6 (six) hours as needed.  Marland Kitchen levothyroxine (SYNTHROID, LEVOTHROID) 88 MCG tablet Take 88 mcg by mouth daily before breakfast.   . lisinopril (PRINIVIL,ZESTRIL) 10 MG tablet Take 1 tablet by mouth daily.  . metoprolol tartrate (LOPRESSOR) 50 MG tablet Take 50 mg by mouth daily.   . metroNIDAZOLE (FLAGYL) 500 MG tablet Take 1 tablet (500 mg total) by mouth 3 (three) times daily.  . Multiple Vitamin (MULTIVITAMIN WITH MINERALS) TABS tablet Take 1  tablet by mouth daily.  . nitroGLYCERIN (NITROSTAT) 0.4 MG SL tablet Place 1 tablet (0.4 mg total) under the tongue every 5 (five) minutes as needed for chest pain.  Marland Kitchen omeprazole (PRILOSEC) 20 MG capsule Take 20 mg by mouth daily.   No facility-administered encounter medications on file as of 01/08/2018.     Allergy:  No Known Allergies  Social Hx:   Social History   Socioeconomic History  . Marital status: Married    Spouse name: Not on file  . Number of children: Not on file  . Years of education: Not on file  . Highest education level: Not on file  Occupational History  . Not on file  Social Needs  . Financial resource strain: Not on file  . Food insecurity:    Worry: Not on file    Inability: Not on file  . Transportation needs:    Medical: Not on file    Non-medical: Not on file  Tobacco Use  .  Smoking status: Never Smoker  . Smokeless tobacco: Never Used  Substance and Sexual Activity  . Alcohol use: No  . Drug use: No  . Sexual activity: Not on file  Lifestyle  . Physical activity:    Days per week: Not on file    Minutes per session: Not on file  . Stress: Not on file  Relationships  . Social connections:    Talks on phone: Not on file    Gets together: Not on file    Attends religious service: Not on file    Active member of club or organization: Not on file    Attends meetings of clubs or organizations: Not on file    Relationship status: Not on file  . Intimate partner violence:    Fear of current or ex partner: Not on file    Emotionally abused: Not on file    Physically abused: Not on file    Forced sexual activity: Not on file  Other Topics Concern  . Not on file  Social History Narrative   Lives alone,retired.   Husband deceased    Past Surgical Hx:  Past Surgical History:  Procedure Laterality Date  . APPENDECTOMY     yaken with first c section   . CESAREAN SECTION     x2   . CORONARY ARTERY BYPASS GRAFT  02/2015   DOUBLE BYPASS ;  DONE AT  [place]    . EYE SURGERY  1999   bilateral cataract extraction   . JOINT REPLACEMENT Right    hip  . KNEE ARTHROSCOPY Left   . neck back surgery  2009  . ROBOTIC ASSISTED TOTAL HYSTERECTOMY WITH BILATERAL SALPINGO OOPHERECTOMY Bilateral 06/15/2017   Procedure: XI ROBOTIC ASSISTED TOTAL HYSTERECTOMY WITH BILATERAL SALPINGO OOPHORECTOMY WITH SENTINAL LYMPH NODES, PELVIC LYMPHADENECTOMY, AND LYSIS OF ADHESIONS;  Surgeon: Everitt Amber, MD;  Location: [facility];  Service: Gynecology;  Laterality: Bilateral;  . ROTATOR CUFF REPAIR Left   . THYROID SURGERY    . THYROIDECTOMY  2000s    Past Medical Hx:  Past Medical History:  Diagnosis Date  . Asthma, moderate persistent 08/02/2013   6/9/2015[facility]ro: normal CXR normal  ; DENIS , STATES I HAD BRONCHITIS BEFORE AND USED AN INHALER TEMPORARILY   . Cancer [facility])    endometrial   . Coronary atherosclerosis of native coronary artery 03/16/2015  . Female bladder prolapse   . Hyperlipidemia   . Hypertension   . Hypothyroidism   . Left bundle branch block (LBBB) 03/04/2017  . 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 Seasonal allergies   . Thyroid disease     Past Gynecological History:  C/s x 2 No LMP recorded. Patient is postmenopausal.  Family Hx:  Family History  Problem Relation Age of Onset  . Heart failure Mother   . Depression Father        commited suicide  . Kidney cancer Brother   . Leukemia Brother     Review of Systems:  Constitutional  Feels well,    ENT Normal appearing ears and nares bilaterally Skin/Breast  No rash, sores, jaundice, itching, dryness Cardiovascular  No chest pain, shortness of breath, or edema  Pulmonary  No cough or wheeze.  Gastro Intestinal  No nausea, vomitting, or diarrhoea. No bright red blood per rectum, no abdominal pain, change in bowel movement, or constipation.  Genito Urinary  No frequency, urgency, dysuria, + prolapse "bulge" Musculo Skeletal  No myalgia, arthralgia, joint swelling or  pain  Neurologic  No weakness, numbness, change in gait,  Psychology  No depression, anxiety, insomnia.   Vitals:  Blood pressure (!) 163/68, pulse 70, temperature 97.6 F (36.4 C), temperature source Oral, resp. rate 18, height 5\' 2"  (1.575 m), weight 140 lb 11.2 oz (63.8 kg), SpO2 98 %.  Physical Exam: WD in NAD Neck  Supple NROM, without any enlargements.  Lymph Node Survey No cervical supraclavicular or inguinal adenopathy Cardiovascular  Pulse normal rate, regularity and rhythm. S1 and S2 normal.  Lungs  Clear to auscultation bilateraly, without wheezes/crackles/rhonchi. Good air movement.  Skin  No rash/lesions/breakdown  Psychiatry  Santiago and oriented to person, place, and time  Abdomen  Normoactive bowel sounds, abdomen soft, non-tender and obese without evidence of hernia.  Back No CVA tenderness Genito Urinary  Cystocele, at vaginal apex the vaginal cuff is extremely attenuated and somewhat translucent consistent with enterocele. On digital palpation of this, the defect is easily reducible however a slight area of a nodule (<1cm) was appreciated on the posterior aspect of the defect. Healing well with sutures present at biopsy site. No cuff separation or opening.   Rectal  deferred  Extremities  No bilateral cyanosis, clubbing or edema.  Thereasa Solo, MD  01/08/2018, 2:31 PM

## 2018-01-11 ENCOUNTER — Ambulatory Visit: Payer: Medicare Other | Admitting: Gynecologic Oncology

## 2018-01-15 ENCOUNTER — Telehealth: Payer: Self-pay | Admitting: *Deleted

## 2018-01-15 NOTE — Telephone Encounter (Signed)
Per patient request I have fax and routed the last office note to Dr. Stann Mainland

## 2018-02-08 DIAGNOSIS — L300 Nummular dermatitis: Secondary | ICD-10-CM | POA: Diagnosis not present

## 2018-02-08 DIAGNOSIS — L821 Other seborrheic keratosis: Secondary | ICD-10-CM | POA: Diagnosis not present

## 2018-03-01 DIAGNOSIS — N993 Prolapse of vaginal vault after hysterectomy: Secondary | ICD-10-CM | POA: Diagnosis not present

## 2018-03-01 DIAGNOSIS — R32 Unspecified urinary incontinence: Secondary | ICD-10-CM | POA: Diagnosis not present

## 2018-03-01 DIAGNOSIS — N952 Postmenopausal atrophic vaginitis: Secondary | ICD-10-CM | POA: Diagnosis not present

## 2018-03-01 DIAGNOSIS — N3946 Mixed incontinence: Secondary | ICD-10-CM | POA: Diagnosis not present

## 2018-03-01 DIAGNOSIS — R82998 Other abnormal findings in urine: Secondary | ICD-10-CM | POA: Diagnosis not present

## 2018-03-10 ENCOUNTER — Ambulatory Visit (INDEPENDENT_AMBULATORY_CARE_PROVIDER_SITE_OTHER): Payer: Medicare Other | Admitting: Cardiology

## 2018-03-10 ENCOUNTER — Encounter: Payer: Self-pay | Admitting: Cardiology

## 2018-03-10 VITALS — BP 122/60 | HR 66 | Ht 62.0 in | Wt 137.0 lb

## 2018-03-10 DIAGNOSIS — Z01810 Encounter for preprocedural cardiovascular examination: Secondary | ICD-10-CM

## 2018-03-10 DIAGNOSIS — I2510 Atherosclerotic heart disease of native coronary artery without angina pectoris: Secondary | ICD-10-CM

## 2018-03-10 DIAGNOSIS — I10 Essential (primary) hypertension: Secondary | ICD-10-CM | POA: Diagnosis not present

## 2018-03-10 DIAGNOSIS — Z951 Presence of aortocoronary bypass graft: Secondary | ICD-10-CM

## 2018-03-10 NOTE — Progress Notes (Signed)
Cardiology Office Note:    Date:  03/10/2018   ID:  Brittany Santiago, DOB 1930-09-28, MRN [id number]  PCP:  Nicoletta Dress, MD  Cardiologist:  Jenean Lindau, MD   Referring MD: Nicoletta Dress, MD    ASSESSMENT:    1. Pre-operative cardiovascular examination   2. Essential hypertension   3. Coronary artery disease involving native coronary artery of native heart without angina pectoris   4. S/P CABG x 2    PLAN:    In order of problems listed above:  1. Secondary prevention stressed with the patient.  Importance of compliance with diet and medication stressed and she vocalized understanding.  Her blood pressure is stable.  Diet was discussed for dyslipidemia. 2. In view of her breasts assessment and her sedentary lifestyle and not coronary artery disease she will undergo Lexiscan sestamibi.  If this is negative then she is not at high risk for coronary events during the aforementioned surgery.  Meticulous hemodynamic monitoring and continued perioperative beta-blockade will further reduce the risk of coronary events. 3. Patient will be seen in follow-up appointment in 6 months or earlier if the patient has any concerns    Medication Adjustments/Labs and Tests Ordered: Current medicines are reviewed at length with the patient today.  Concerns regarding medicines are outlined above.  No orders of the defined types were placed in this encounter.  No orders of the defined types were placed in this encounter.    No chief complaint on file.    History of Present Illness:    Brittany Santiago is a 85 y.o. female.  Patient has known coronary artery disease.  She denies any problems at this time and takes care of of activities of daily living.  No chest pain orthopnea or PND.  She leads a sedentary lifestyle because of orthopedic issues involving her knee.  She is planning to undergo bladder prolapse surgery and is here for preop assessment.  Past Medical History:  Diagnosis  Date  . Asthma, moderate persistent 08/02/2013   6/9/2015Spiro: normal CXR normal  ; DENIS , STATES I HAD BRONCHITIS BEFORE AND USED AN INHALER TEMPORARILY   . Cancer [facility])    endometrial   . Coronary atherosclerosis of native coronary artery 03/16/2015  . Female bladder prolapse   . Hyperlipidemia   . Hypertension   . Hypothyroidism   . Left bundle branch block (LBBB) 03/04/2017  . 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 Seasonal allergies   . Thyroid disease     Past Surgical History:  Procedure Laterality Date  . ABDOMINAL HYSTERECTOMY  2019  . APPENDECTOMY     yaken with first c section   . CESAREAN SECTION     x2   . CORONARY ARTERY BYPASS GRAFT  02/2015   DOUBLE BYPASS ; DONE AT  [place]    . EYE SURGERY  1999   bilateral cataract extraction   . JOINT REPLACEMENT Right    hip  . KNEE ARTHROSCOPY Left   . neck back surgery  2009  . ROBOTIC ASSISTED TOTAL HYSTERECTOMY WITH BILATERAL SALPINGO OOPHERECTOMY Bilateral 06/15/2017   Procedure: XI ROBOTIC ASSISTED TOTAL HYSTERECTOMY WITH BILATERAL SALPINGO OOPHORECTOMY WITH SENTINAL LYMPH NODES, PELVIC LYMPHADENECTOMY, AND LYSIS OF ADHESIONS;  Surgeon: Everitt Amber, MD;  Location: [facility];  Service: Gynecology;  Laterality: Bilateral;  . ROTATOR CUFF REPAIR Left   . THYROID SURGERY    . THYROIDECTOMY  2000s    Current Medications: Current Meds  Medication Sig  . acetaminophen (TYLENOL) 500 MG tablet Take 500 mg by mouth daily as needed for moderate pain or headache.  . ALPRAZolam (XANAX) 0.25 MG tablet Take 0.25 mg by mouth daily as needed for anxiety.   Marland Kitchen amLODipine (NORVASC) 5 MG tablet Take 5 mg by mouth at bedtime.   Marland Kitchen aspirin 81 MG tablet Take 81 mg by mouth at bedtime.   Marland Kitchen atorvastatin (LIPITOR) 80 MG tablet Take 80 mg by mouth at bedtime.   . Cholecalciferol (VITAMIN D3) 400 units CAPS Take 400 Units by mouth daily.  Marland Kitchen ezetimibe (ZETIA) 10 MG tablet Take 10 mg by mouth daily.   . Fiber POWD Take 1 Scoop by mouth daily.  Marland Kitchen  ibuprofen (ADVIL,MOTRIN) 600 MG tablet Take 1 tablet (600 mg total) by mouth every 6 (six) hours as needed.  Marland Kitchen levothyroxine (SYNTHROID, LEVOTHROID) 88 MCG tablet Take 88 mcg by mouth daily before breakfast.   . lisinopril (PRINIVIL,ZESTRIL) 10 MG tablet Take 1 tablet by mouth daily.  . metoprolol tartrate (LOPRESSOR) 50 MG tablet Take 50 mg by mouth daily.   . Multiple Vitamin (MULTIVITAMIN WITH MINERALS) TABS tablet Take 1 tablet by mouth daily.  Marland Kitchen omeprazole (PRILOSEC) 20 MG capsule Take 20 mg by mouth daily.     Allergies:   Patient has no known allergies.   Social History   Socioeconomic History  . Marital status: Married    Spouse name: Not on file  . Number of children: Not on file  . Years of education: Not on file  . Highest education level: Not on file  Occupational History  . Not on file  Social Needs  . Financial resource strain: Not on file  . Food insecurity:    Worry: Not on file    Inability: Not on file  . Transportation needs:    Medical: Not on file    Non-medical: Not on file  Tobacco Use  . Smoking status: Never Smoker  . Smokeless tobacco: Never Used  Substance and Sexual Activity  . Alcohol use: No  . Drug use: No  . Sexual activity: Not on file  Lifestyle  . Physical activity:    Days per week: Not on file    Minutes per session: Not on file  . Stress: Not on file  Relationships  . Social connections:    Talks on phone: Not on file    Gets together: Not on file    Attends religious service: Not on file    Active member of club or organization: Not on file    Attends meetings of clubs or organizations: Not on file    Relationship status: Not on file  Other Topics Concern  . Not on file  Social History Narrative   Lives alone,retired.   Husband deceased     Family History: The patient's family history includes Depression in her father; Heart failure in her mother; Kidney cancer in her brother; Leukemia in her brother.  ROS:   Please see  the history of present illness.    All other systems reviewed and are negative.  EKGs/Labs/Other Studies Reviewed:    The following studies were reviewed today:  The patient at extensive length.   Recent Labs: 06/10/2017: ALT 30 06/16/2017: BUN 17; Creatinine, Ser 1.03; Hemoglobin 11.4; Platelets 247; Potassium 3.6; Sodium 135  Recent Lipid Panel No results found for: CHOL, TRIG, HDL, CHOLHDL, VLDL, LDLCALC, LDLDIRECT  Physical Exam:    VS:  BP 122/60 (BP Location: Right  Arm, Patient Position: Sitting, Cuff Size: Normal)   Pulse 66   Ht 5\' 2"  (1.575 m)   Wt 137 lb (62.1 kg)   SpO2 97%   BMI 25.06 kg/m     Wt Readings from Last 3 Encounters:  03/10/18 137 lb (62.1 kg)  01/08/18 140 lb 11.2 oz (63.8 kg)  12/30/17 140 lb 1.6 oz (63.5 kg)     GEN: Patient is in no acute distress HEENT: Normal NECK: No JVD; No carotid bruits LYMPHATICS: No lymphadenopathy CARDIAC: Hear sounds regular, 2/6 systolic murmur at the apex. RESPIRATORY:  Clear to auscultation without rales, wheezing or rhonchi  ABDOMEN: Soft, non-tender, non-distended MUSCULOSKELETAL:  No edema; No deformity  SKIN: Warm and dry NEUROLOGIC:  Alert and oriented x 3 PSYCHIATRIC:  Normal affect   Signed, Jenean Lindau, MD  03/10/2018 10:47 AM    [place]

## 2018-03-10 NOTE — Patient Instructions (Signed)
Medication Instructions:  Your physician recommends that you continue on your current medications as directed. Please refer to the Current Medication list given to you today.  If you need a refill on your cardiac medications before your next appointment, please call your pharmacy.   Lab work: Your physician recommends that you return for lab work in: BMP,CBC,TSH,LFT and Lipids. If you have labs (blood work) drawn today and your tests are completely normal, you will receive your results only by: Marland Kitchen MyChart Message (if you have MyChart) OR . A paper copy in the mail If you have any lab test that is abnormal or we need to change your treatment, we will call you to review the results.  Testing/Procedures: Your physician has requested that you have a lexiscan myoview. For further information please visit [email or web address]. Please follow instruction sheet, as given.   Follow-Up: At [facility], you and your health needs are our priority.  As part of our continuing mission to provide you with exceptional heart care, we have created designated Provider Care Teams.  These Care Teams include your primary Cardiologist (physician) and Advanced Practice Providers (APPs -  Physician Assistants and Nurse Practitioners) who all work together to provide you with the care you need, when you need it. You will need a follow up appointment in 6 months.  Please call our office 2 months in advance to schedule this appointment.  You may see No primary care provider on file. or another member of our [employer] Provider Team in [place]: Jenne Campus, MD . Shirlee More, MD  Any Other Special Instructions Will Be Listed Below (If Applicable).

## 2018-03-11 DIAGNOSIS — I2510 Atherosclerotic heart disease of native coronary artery without angina pectoris: Secondary | ICD-10-CM | POA: Diagnosis not present

## 2018-03-11 DIAGNOSIS — Z01810 Encounter for preprocedural cardiovascular examination: Secondary | ICD-10-CM | POA: Diagnosis not present

## 2018-03-11 DIAGNOSIS — I10 Essential (primary) hypertension: Secondary | ICD-10-CM | POA: Diagnosis not present

## 2018-03-12 LAB — BASIC METABOLIC PANEL
BUN/Creatinine Ratio: 13 (ref 12–28)
BUN: 13 mg/dL (ref 8–27)
CO2: 21 mmol/L (ref 20–29)
CREATININE: 0.99 mg/dL (ref 0.57–1.00)
Calcium: 9.6 mg/dL (ref 8.7–10.3)
Chloride: 102 mmol/L (ref 96–106)
GFR calc Af Amer: 60 mL/min/{1.73_m2} (ref >59)
GFR calc non Af Amer: 52 mL/min/{1.73_m2} — ABNORMAL LOW (ref >59)
Glucose: 104 mg/dL — ABNORMAL HIGH (ref 65–99)
Potassium: 4.7 mmol/L (ref 3.5–5.2)
Sodium: 139 mmol/L (ref 134–144)

## 2018-03-12 LAB — LIPID PANEL
Chol/HDL Ratio: 3.8 ratio (ref 0.0–4.4)
Cholesterol, Total: 136 mg/dL (ref 100–199)
HDL: 36 mg/dL — ABNORMAL LOW (ref >39)
LDL Calculated: 69 mg/dL (ref 0–99)
Triglycerides: 157 mg/dL — ABNORMAL HIGH (ref 0–149)
VLDL Cholesterol Cal: 31 mg/dL (ref 5–40)

## 2018-03-12 LAB — CBC
Hematocrit: 40.1 % (ref 34.0–46.6)
Hemoglobin: 13.5 g/dL (ref 11.1–15.9)
MCH: 30.3 pg (ref 26.6–33.0)
MCHC: 33.7 g/dL (ref 31.5–35.7)
MCV: 90 fL (ref 79–97)
Platelets: 330 10*3/uL (ref 150–450)
RBC: 4.45 x10E6/uL (ref 3.77–5.28)
RDW: 13 % (ref 11.7–15.4)
WBC: 6.1 10*3/uL (ref 3.4–10.8)

## 2018-03-12 LAB — HEPATIC FUNCTION PANEL
ALK PHOS: 72 IU/L (ref 39–117)
ALT: 27 IU/L (ref 0–32)
AST: 36 IU/L (ref 0–40)
Albumin: 4.3 g/dL (ref 3.5–4.7)
Bilirubin Total: 0.6 mg/dL (ref 0.0–1.2)
Bilirubin, Direct: 0.19 mg/dL (ref 0.00–0.40)
Total Protein: 6.7 g/dL (ref 6.0–8.5)

## 2018-03-12 LAB — TSH: TSH: 1.55 u[IU]/mL (ref 0.450–4.500)

## 2018-03-29 ENCOUNTER — Telehealth (HOSPITAL_COMMUNITY): Payer: Self-pay | Admitting: *Deleted

## 2018-03-29 DIAGNOSIS — N3946 Mixed incontinence: Secondary | ICD-10-CM | POA: Diagnosis not present

## 2018-03-29 DIAGNOSIS — R82998 Other abnormal findings in urine: Secondary | ICD-10-CM | POA: Diagnosis not present

## 2018-03-29 DIAGNOSIS — N993 Prolapse of vaginal vault after hysterectomy: Secondary | ICD-10-CM | POA: Diagnosis not present

## 2018-03-29 NOTE — Telephone Encounter (Signed)
Left message on voicemail per DPR in reference to upcoming appointment scheduled on 03/31/18 with detailed instructions given per Myocardial Perfusion Study Information Sheet for the test. LM to arrive 15 minutes early, and that it is imperative to arrive on time for appointment to keep from having the test rescheduled. If you need to cancel or reschedule your appointment, please call the office within 24 hours of your appointment. Failure to do so may result in a cancellation of your appointment, and a $50 no show fee. Phone number given for call back for any questions. [facility] [place] Jacqueline    

## 2018-03-31 ENCOUNTER — Telehealth: Payer: Self-pay

## 2018-03-31 ENCOUNTER — Ambulatory Visit (INDEPENDENT_AMBULATORY_CARE_PROVIDER_SITE_OTHER): Payer: Medicare Other

## 2018-03-31 VITALS — Ht 62.0 in | Wt 137.0 lb

## 2018-03-31 DIAGNOSIS — I447 Left bundle-branch block, unspecified: Secondary | ICD-10-CM

## 2018-03-31 DIAGNOSIS — I2510 Atherosclerotic heart disease of native coronary artery without angina pectoris: Secondary | ICD-10-CM

## 2018-03-31 DIAGNOSIS — Z951 Presence of aortocoronary bypass graft: Secondary | ICD-10-CM | POA: Diagnosis not present

## 2018-03-31 DIAGNOSIS — Z01810 Encounter for preprocedural cardiovascular examination: Secondary | ICD-10-CM | POA: Diagnosis not present

## 2018-03-31 LAB — MYOCARDIAL PERFUSION IMAGING
CHL CUP NUCLEAR SRS: 1
LHR: 3
LV dias vol: 61 mL (ref 46–106)
LV sys vol: 14 mL
Peak HR: 103 {beats}/min
Rest HR: 85 {beats}/min
SSS: 2
TID: 1.05

## 2018-03-31 MED ORDER — REGADENOSON 0.4 MG/5ML IV SOLN
0.4000 mg | Freq: Once | INTRAVENOUS | Status: AC
  Administered 2018-03-31: 0.4 mg via INTRAVENOUS

## 2018-03-31 MED ORDER — TECHNETIUM TC 99M TETROFOSMIN IV KIT
10.7000 | PACK | Freq: Once | INTRAVENOUS | Status: AC | PRN
  Administered 2018-03-31: 10.7 via INTRAVENOUS

## 2018-03-31 MED ORDER — TECHNETIUM TC 99M TETROFOSMIN IV KIT
31.5000 | PACK | Freq: Once | INTRAVENOUS | Status: AC | PRN
  Administered 2018-03-31: 31.5 via INTRAVENOUS

## 2018-03-31 NOTE — Telephone Encounter (Signed)
Called patient and left detailed voice message on patients phone regarding test results. 

## 2018-03-31 NOTE — Telephone Encounter (Signed)
-----   Message from Jenean Lindau, MD sent at 03/31/2018  3:43 PM EST ----- The results of the study is unremarkable. Please inform patient. I will discuss in detail at next appointment. Cc  primary care/referring physician Jenean Lindau, MD 03/31/2018 3:43 PM

## 2018-04-20 DIAGNOSIS — K219 Gastro-esophageal reflux disease without esophagitis: Secondary | ICD-10-CM | POA: Diagnosis not present

## 2018-04-20 DIAGNOSIS — I2510 Atherosclerotic heart disease of native coronary artery without angina pectoris: Secondary | ICD-10-CM | POA: Diagnosis not present

## 2018-04-20 DIAGNOSIS — Z951 Presence of aortocoronary bypass graft: Secondary | ICD-10-CM | POA: Diagnosis not present

## 2018-04-20 DIAGNOSIS — N898 Other specified noninflammatory disorders of vagina: Secondary | ICD-10-CM | POA: Diagnosis not present

## 2018-04-20 DIAGNOSIS — N993 Prolapse of vaginal vault after hysterectomy: Secondary | ICD-10-CM | POA: Diagnosis not present

## 2018-04-20 DIAGNOSIS — I10 Essential (primary) hypertension: Secondary | ICD-10-CM | POA: Diagnosis not present

## 2018-04-20 DIAGNOSIS — E039 Hypothyroidism, unspecified: Secondary | ICD-10-CM | POA: Diagnosis not present

## 2018-04-20 DIAGNOSIS — N813 Complete uterovaginal prolapse: Secondary | ICD-10-CM | POA: Diagnosis not present

## 2018-04-20 DIAGNOSIS — Z9889 Other specified postprocedural states: Secondary | ICD-10-CM | POA: Diagnosis not present

## 2018-04-20 DIAGNOSIS — N819 Female genital prolapse, unspecified: Secondary | ICD-10-CM

## 2018-04-20 DIAGNOSIS — N393 Stress incontinence (female) (male): Secondary | ICD-10-CM | POA: Diagnosis not present

## 2018-04-20 HISTORY — DX: Female genital prolapse, unspecified: N81.9

## 2018-04-21 DIAGNOSIS — Z951 Presence of aortocoronary bypass graft: Secondary | ICD-10-CM | POA: Diagnosis not present

## 2018-04-21 DIAGNOSIS — I2510 Atherosclerotic heart disease of native coronary artery without angina pectoris: Secondary | ICD-10-CM | POA: Diagnosis not present

## 2018-04-21 DIAGNOSIS — N813 Complete uterovaginal prolapse: Secondary | ICD-10-CM | POA: Diagnosis not present

## 2018-04-21 DIAGNOSIS — Z9889 Other specified postprocedural states: Secondary | ICD-10-CM | POA: Diagnosis not present

## 2018-04-21 DIAGNOSIS — N393 Stress incontinence (female) (male): Secondary | ICD-10-CM | POA: Diagnosis not present

## 2018-04-21 DIAGNOSIS — N898 Other specified noninflammatory disorders of vagina: Secondary | ICD-10-CM | POA: Diagnosis not present

## 2018-04-26 DIAGNOSIS — N993 Prolapse of vaginal vault after hysterectomy: Secondary | ICD-10-CM | POA: Diagnosis not present

## 2018-04-26 DIAGNOSIS — N949 Unspecified condition associated with female genital organs and menstrual cycle: Secondary | ICD-10-CM | POA: Diagnosis not present

## 2018-05-19 DIAGNOSIS — R399 Unspecified symptoms and signs involving the genitourinary system: Secondary | ICD-10-CM | POA: Diagnosis not present

## 2018-05-19 DIAGNOSIS — I10 Essential (primary) hypertension: Secondary | ICD-10-CM | POA: Diagnosis not present

## 2018-05-19 DIAGNOSIS — E890 Postprocedural hypothyroidism: Secondary | ICD-10-CM | POA: Diagnosis not present

## 2018-05-19 DIAGNOSIS — Z9181 History of falling: Secondary | ICD-10-CM | POA: Diagnosis not present

## 2018-05-19 DIAGNOSIS — I2510 Atherosclerotic heart disease of native coronary artery without angina pectoris: Secondary | ICD-10-CM | POA: Diagnosis not present

## 2018-05-19 DIAGNOSIS — E785 Hyperlipidemia, unspecified: Secondary | ICD-10-CM | POA: Diagnosis not present

## 2018-05-19 DIAGNOSIS — Z1331 Encounter for screening for depression: Secondary | ICD-10-CM | POA: Diagnosis not present

## 2018-05-19 DIAGNOSIS — E559 Vitamin D deficiency, unspecified: Secondary | ICD-10-CM | POA: Diagnosis not present

## 2018-09-01 DIAGNOSIS — Z1231 Encounter for screening mammogram for malignant neoplasm of breast: Secondary | ICD-10-CM | POA: Diagnosis not present

## 2018-09-01 DIAGNOSIS — N959 Unspecified menopausal and perimenopausal disorder: Secondary | ICD-10-CM | POA: Diagnosis not present

## 2018-09-01 DIAGNOSIS — Z0000 Encounter for general adult medical examination without abnormal findings: Secondary | ICD-10-CM | POA: Diagnosis not present

## 2018-09-01 DIAGNOSIS — E785 Hyperlipidemia, unspecified: Secondary | ICD-10-CM | POA: Diagnosis not present

## 2018-09-01 DIAGNOSIS — Z1331 Encounter for screening for depression: Secondary | ICD-10-CM | POA: Diagnosis not present

## 2018-09-01 DIAGNOSIS — Z136 Encounter for screening for cardiovascular disorders: Secondary | ICD-10-CM | POA: Diagnosis not present

## 2018-09-01 DIAGNOSIS — Z139 Encounter for screening, unspecified: Secondary | ICD-10-CM | POA: Diagnosis not present

## 2018-09-01 DIAGNOSIS — Z9181 History of falling: Secondary | ICD-10-CM | POA: Diagnosis not present

## 2018-10-07 DIAGNOSIS — Z1231 Encounter for screening mammogram for malignant neoplasm of breast: Secondary | ICD-10-CM | POA: Diagnosis not present

## 2018-10-21 DIAGNOSIS — H5213 Myopia, bilateral: Secondary | ICD-10-CM | POA: Diagnosis not present

## 2018-10-21 DIAGNOSIS — H04123 Dry eye syndrome of bilateral lacrimal glands: Secondary | ICD-10-CM | POA: Diagnosis not present

## 2018-10-21 DIAGNOSIS — H353131 Nonexudative age-related macular degeneration, bilateral, early dry stage: Secondary | ICD-10-CM | POA: Diagnosis not present

## 2018-10-21 DIAGNOSIS — H524 Presbyopia: Secondary | ICD-10-CM | POA: Diagnosis not present

## 2018-10-21 DIAGNOSIS — Z961 Presence of intraocular lens: Secondary | ICD-10-CM | POA: Diagnosis not present

## 2018-11-24 DIAGNOSIS — I10 Essential (primary) hypertension: Secondary | ICD-10-CM | POA: Diagnosis not present

## 2018-11-24 DIAGNOSIS — Z23 Encounter for immunization: Secondary | ICD-10-CM | POA: Diagnosis not present

## 2018-11-24 DIAGNOSIS — I2510 Atherosclerotic heart disease of native coronary artery without angina pectoris: Secondary | ICD-10-CM | POA: Diagnosis not present

## 2018-11-24 DIAGNOSIS — E785 Hyperlipidemia, unspecified: Secondary | ICD-10-CM | POA: Diagnosis not present

## 2018-11-24 DIAGNOSIS — E890 Postprocedural hypothyroidism: Secondary | ICD-10-CM | POA: Diagnosis not present

## 2018-11-24 DIAGNOSIS — E559 Vitamin D deficiency, unspecified: Secondary | ICD-10-CM | POA: Diagnosis not present

## 2018-11-26 ENCOUNTER — Ambulatory Visit (INDEPENDENT_AMBULATORY_CARE_PROVIDER_SITE_OTHER): Payer: Medicare Other | Admitting: Cardiology

## 2018-11-26 ENCOUNTER — Other Ambulatory Visit: Payer: Self-pay

## 2018-11-26 ENCOUNTER — Encounter: Payer: Self-pay | Admitting: Cardiology

## 2018-11-26 VITALS — BP 140/70 | HR 65 | Ht 62.0 in | Wt 137.0 lb

## 2018-11-26 DIAGNOSIS — E785 Hyperlipidemia, unspecified: Secondary | ICD-10-CM

## 2018-11-26 DIAGNOSIS — Z951 Presence of aortocoronary bypass graft: Secondary | ICD-10-CM | POA: Diagnosis not present

## 2018-11-26 DIAGNOSIS — I2510 Atherosclerotic heart disease of native coronary artery without angina pectoris: Secondary | ICD-10-CM

## 2018-11-26 NOTE — Progress Notes (Signed)
Cardiology Office Note:    Date:  11/26/2018   ID:  Brittany Santiago, DOB 1930-12-11, MRN [id number]  PCP:  Nicoletta Dress, MD  Cardiologist:  Jenean Lindau, MD   Referring MD: Nicoletta Dress, MD    ASSESSMENT:    1. Coronary artery disease involving native coronary artery of native heart without angina pectoris   2. S/P CABG x 2   3. Dyslipidemia    PLAN:    In order of problems listed above:  1. Coronary artery disease: Secondary prevention stressed with the patient.  Importance of compliance with diet and medication stressed and she vocalized understanding.  Importance of regular exercise stressed. 2. Essential hypertension: Blood pressure stable 3. Mixed dyslipidemia: Lipids were reviewed and we will get a copy of the recent lab work. 4. Patient will be seen in follow-up appointment in 6 months or earlier if the patient has any concerns    Medication Adjustments/Labs and Tests Ordered: Current medicines are reviewed at length with the patient today.  Concerns regarding medicines are outlined above.  No orders of the defined types were placed in this encounter.  No orders of the defined types were placed in this encounter.    Chief Complaint  Patient presents with  . Follow-up     History of Present Illness:    Brittany Santiago is a 85 y.o. female.  Patient has past medical history of coronary artery disease, essential hypertension and dyslipidemia.  She leads an amazingly active lifestyle.  No chest pain orthopnea or PND.  At the time of my evaluation, the patient is alert awake oriented and in no distress.  Past Medical History:  Diagnosis Date  . Asthma, moderate persistent 08/02/2013   6/9/2015[facility]: normal CXR normal  ; DENIS , STATES I HAD BRONCHITIS BEFORE AND USED AN INHALER TEMPORARILY   . Cancer [facility])    endometrial   . Coronary atherosclerosis of native coronary artery 03/16/2015  . Female bladder prolapse   . Hyperlipidemia   . Hypertension    . Hypothyroidism   . Left bundle branch block (LBBB) 03/04/2017  . 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 Seasonal allergies   . Thyroid disease     Past Surgical History:  Procedure Laterality Date  . ABDOMINAL HYSTERECTOMY  2019  . APPENDECTOMY     yaken with first c section   . CESAREAN SECTION     x2   . CORONARY ARTERY BYPASS GRAFT  02/2015   DOUBLE BYPASS ; DONE AT  [place]    . EYE SURGERY  1999   bilateral cataract extraction   . JOINT REPLACEMENT Right    hip  . KNEE ARTHROSCOPY Left   . neck back surgery  2009  . ROBOTIC ASSISTED TOTAL HYSTERECTOMY WITH BILATERAL SALPINGO OOPHERECTOMY Bilateral 06/15/2017   Procedure: XI ROBOTIC ASSISTED TOTAL HYSTERECTOMY WITH BILATERAL SALPINGO OOPHORECTOMY WITH SENTINAL LYMPH NODES, PELVIC LYMPHADENECTOMY, AND LYSIS OF ADHESIONS;  Surgeon: Everitt Amber, MD;  Location: [facility];  Service: Gynecology;  Laterality: Bilateral;  . ROTATOR CUFF REPAIR Left   . THYROID SURGERY    . THYROIDECTOMY  2000s    Current Medications: Current Meds  Medication Sig  . acetaminophen (TYLENOL) 500 MG tablet Take 500 mg by mouth daily as needed for moderate pain or headache.  . ALPRAZolam (XANAX) 0.25 MG tablet Take 0.25 mg by mouth daily as needed for anxiety.   Marland Kitchen amLODipine (NORVASC) 5 MG tablet Take 5 mg by  mouth at bedtime.   Marland Kitchen aspirin 81 MG tablet Take 81 mg by mouth at bedtime.   Marland Kitchen atorvastatin (LIPITOR) 80 MG tablet Take 80 mg by mouth at bedtime.   Marland Kitchen azelastine (OPTIVAR) 0.05 % ophthalmic solution   . Cholecalciferol (VITAMIN D3) 400 units CAPS Take 400 Units by mouth daily.  Marland Kitchen ezetimibe (ZETIA) 10 MG tablet Take 10 mg by mouth daily.   . Fiber POWD Take 1 Scoop by mouth daily.  Marland Kitchen ibuprofen (ADVIL,MOTRIN) 600 MG tablet Take 1 tablet (600 mg total) by mouth every 6 (six) hours as needed.  Marland Kitchen levothyroxine (SYNTHROID, LEVOTHROID) 88 MCG tablet Take 88 mcg by mouth daily before breakfast.   . lisinopril (PRINIVIL,ZESTRIL) 10 MG tablet Take 1 tablet  by mouth daily.  . metoprolol tartrate (LOPRESSOR) 50 MG tablet Take 50 mg by mouth daily.   . Multiple Vitamin (MULTIVITAMIN WITH MINERALS) TABS tablet Take 1 tablet by mouth daily.  . nitroGLYCERIN (NITROSTAT) 0.4 MG SL tablet Place 1 tablet (0.4 mg total) under the tongue every 5 (five) minutes as needed for chest pain.  Marland Kitchen omeprazole (PRILOSEC) 20 MG capsule Take 20 mg by mouth daily.  Marland Kitchen PREMARIN vaginal cream      Allergies:   Patient has no known allergies.   Social History   Socioeconomic History  . Marital status: Married    Spouse name: Not on file  . Number of children: Not on file  . Years of education: Not on file  . Highest education level: Not on file  Occupational History  . Not on file  Social Needs  . Financial resource strain: Not on file  . Food insecurity    Worry: Not on file    Inability: Not on file  . Transportation needs    Medical: Not on file    Non-medical: Not on file  Tobacco Use  . Smoking status: Never Smoker  . Smokeless tobacco: Never Used  Substance and Sexual Activity  . Alcohol use: No  . Drug use: No  . Sexual activity: Not on file  Lifestyle  . Physical activity    Days per week: Not on file    Minutes per session: Not on file  . Stress: Not on file  Relationships  . Social Herbalist on phone: Not on file    Gets together: Not on file    Attends religious service: Not on file    Active member of club or organization: Not on file    Attends meetings of clubs or organizations: Not on file    Relationship status: Not on file  Other Topics Concern  . Not on file  Social History Narrative   Lives alone,retired.   Husband deceased     Family History: The patient's family history includes Depression in her father; Heart failure in her mother; Kidney cancer in her brother; Leukemia in her brother.  ROS:   Please see the history of present illness.    All other systems reviewed and are negative.  EKGs/Labs/Other  Studies Reviewed:    The following studies were reviewed today: Patient had blood work recently and we will try to get a copy of those reports from primary care   Recent Labs: 03/11/2018: ALT 27; BUN 13; Creatinine, Ser 0.99; Hemoglobin 13.5; Platelets 330; Potassium 4.7; Sodium 139; TSH 1.550  Recent Lipid Panel    Component Value Date/Time   CHOL 136 03/11/2018 0808   TRIG 157 (H) 03/11/2018 [id number]  HDL 36 (L) 03/11/2018 0808   CHOLHDL 3.8 03/11/2018 0808   LDLCALC 69 03/11/2018 0808    Physical Exam:    VS:  BP 140/70 (BP Location: Left Arm, Patient Position: Sitting, Cuff Size: Normal)   Pulse 65   Ht 5\' 2"  (1.575 m)   Wt 137 lb (62.1 kg)   SpO2 97%   BMI 25.06 kg/m     Wt Readings from Last 3 Encounters:  11/26/18 137 lb (62.1 kg)  03/31/18 137 lb (62.1 kg)  03/10/18 137 lb (62.1 kg)     GEN: Patient is in no acute distress HEENT: Normal NECK: No JVD; No carotid bruits LYMPHATICS: No lymphadenopathy CARDIAC: Hear sounds regular, 2/6 systolic murmur at the apex. RESPIRATORY:  Clear to auscultation without rales, wheezing or rhonchi  ABDOMEN: Soft, non-tender, non-distended MUSCULOSKELETAL:  No edema; No deformity  SKIN: Warm and dry NEUROLOGIC:  Alert and oriented x 3 PSYCHIATRIC:  Normal affect   Signed, Jenean Lindau, MD  11/26/2018 9:49 AM    [place]

## 2018-11-26 NOTE — Patient Instructions (Signed)

## 2018-12-15 DIAGNOSIS — N390 Urinary tract infection, site not specified: Secondary | ICD-10-CM | POA: Diagnosis not present

## 2019-04-11 DIAGNOSIS — Z8742 Personal history of other diseases of the female genital tract: Secondary | ICD-10-CM | POA: Diagnosis not present

## 2019-05-23 DIAGNOSIS — I10 Essential (primary) hypertension: Secondary | ICD-10-CM | POA: Diagnosis not present

## 2019-05-23 DIAGNOSIS — I2510 Atherosclerotic heart disease of native coronary artery without angina pectoris: Secondary | ICD-10-CM | POA: Diagnosis not present

## 2019-05-23 DIAGNOSIS — E890 Postprocedural hypothyroidism: Secondary | ICD-10-CM | POA: Diagnosis not present

## 2019-05-23 DIAGNOSIS — E785 Hyperlipidemia, unspecified: Secondary | ICD-10-CM | POA: Diagnosis not present

## 2019-05-23 DIAGNOSIS — J309 Allergic rhinitis, unspecified: Secondary | ICD-10-CM | POA: Diagnosis not present

## 2019-05-23 DIAGNOSIS — E559 Vitamin D deficiency, unspecified: Secondary | ICD-10-CM | POA: Diagnosis not present

## 2019-06-13 ENCOUNTER — Other Ambulatory Visit: Payer: Self-pay

## 2019-06-14 ENCOUNTER — Encounter: Payer: Self-pay | Admitting: Cardiology

## 2019-06-14 ENCOUNTER — Ambulatory Visit (INDEPENDENT_AMBULATORY_CARE_PROVIDER_SITE_OTHER): Payer: Medicare Other | Admitting: Cardiology

## 2019-06-14 ENCOUNTER — Other Ambulatory Visit: Payer: Self-pay

## 2019-06-14 VITALS — BP 130/82 | HR 60 | Temp 97.8°F | Ht 63.0 in | Wt 134.6 lb

## 2019-06-14 DIAGNOSIS — I10 Essential (primary) hypertension: Secondary | ICD-10-CM

## 2019-06-14 DIAGNOSIS — Z951 Presence of aortocoronary bypass graft: Secondary | ICD-10-CM | POA: Diagnosis not present

## 2019-06-14 DIAGNOSIS — I2510 Atherosclerotic heart disease of native coronary artery without angina pectoris: Secondary | ICD-10-CM | POA: Diagnosis not present

## 2019-06-14 DIAGNOSIS — E785 Hyperlipidemia, unspecified: Secondary | ICD-10-CM

## 2019-06-14 NOTE — Progress Notes (Signed)
Cardiology Office Note:    Date:  06/14/2019   ID:  MELANIE GOWING, DOB 03/26/1930, MRN [id number]  PCP:  Nicoletta Dress, MD  Cardiologist:  Jenean Lindau, MD   Referring MD: Nicoletta Dress, MD    ASSESSMENT:    1. Coronary artery disease involving native coronary artery of native heart without angina pectoris   2. Essential hypertension   3. Dyslipidemia   4. S/P CABG x 2    PLAN:    In order of problems listed above:  1. Coronary artery disease: Secondary prevention stressed with the patient.  Importance of compliance with diet medication stressed and she vocalized understanding. 2. Patient has excellent effort tolerance and she walks on a regular basis. 3. Essential hypertension: Blood pressure is stable 4. Mixed dyslipidemia: Lipids were reviewed.  They were done in March and I reviewed the [facility] sheet and found them to be fine. 5. Patient will be seen in follow-up appointment in 6 months or earlier if the patient has any concerns.  She does have nitroglycerin with her.   Medication Adjustments/Labs and Tests Ordered: Current medicines are reviewed at length with the patient today.  Concerns regarding medicines are outlined above.  No orders of the defined types were placed in this encounter.  No orders of the defined types were placed in this encounter.    No chief complaint on file.    History of Present Illness:    Brittany Santiago is a 86 y.o. female.  Patient has past medical history of coronary artery disease.  She denies any problems at this time and takes care of activities of daily living.  No chest pain orthopnea or PND.  She walks on a regular basis.  Past Medical History:  Diagnosis Date  . Asthma, moderate persistent 08/02/2013   6/9/2015[facility]: normal CXR normal  ; DENIS , STATES I HAD BRONCHITIS BEFORE AND USED AN INHALER TEMPORARILY   . Cancer [facility])    endometrial   . Coronary artery disease involving native coronary artery of native heart  without angina pectoris 03/16/2015  . Coronary atherosclerosis of native coronary artery 03/16/2015  . Dyslipidemia 03/10/2017  . Endometrial cancer ([place]) 05/27/2017  . 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 Female bladder prolapse   . Hyperlipidemia   . Hypertension   . Hypothyroidism   . Left bundle branch block (LBBB) 03/04/2017  . Menopausal symptom 05/29/2017  . Pre-operative cardiovascular examination 05/29/2017  . 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 Seasonal allergies   . Thyroid disease   . 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 Added automatically from request for surgery [id number]    Past Surgical History:  Procedure Laterality Date  . ABDOMINAL HYSTERECTOMY  2019  . APPENDECTOMY     yaken with first c section   . CESAREAN SECTION     x2   . CORONARY ARTERY BYPASS GRAFT  02/2015   DOUBLE BYPASS ; DONE AT  [place]    . EYE SURGERY  1999   bilateral cataract extraction   . JOINT REPLACEMENT Right    hip  . KNEE ARTHROSCOPY Left   . neck back surgery  2009  . ROBOTIC ASSISTED TOTAL HYSTERECTOMY WITH BILATERAL SALPINGO OOPHERECTOMY Bilateral 06/15/2017   Procedure: XI ROBOTIC ASSISTED TOTAL HYSTERECTOMY WITH BILATERAL SALPINGO OOPHORECTOMY WITH SENTINAL LYMPH NODES, PELVIC LYMPHADENECTOMY, AND LYSIS OF ADHESIONS;  Surgeon: Everitt Amber, MD;  Location: [facility];  Service:  Gynecology;  Laterality: Bilateral;  . ROTATOR CUFF REPAIR Left   . THYROID SURGERY    . THYROIDECTOMY  2000s    Current Medications: Current Meds  Medication Sig  . acetaminophen (TYLENOL) 500 MG tablet Take 500 mg by mouth daily as needed for moderate pain or headache.  . ALPRAZolam (XANAX) 0.25 MG tablet Take 0.25 mg by mouth daily as needed for anxiety.   Marland Kitchen amLODipine (NORVASC) 5 MG tablet Take 5 mg by mouth at bedtime.   Marland Kitchen aspirin 81 MG tablet Take 81 mg by mouth at bedtime.   Marland Kitchen atorvastatin (LIPITOR) 80 MG tablet Take 80 mg by mouth at bedtime.   Marland Kitchen azelastine  (OPTIVAR) 0.05 % ophthalmic solution   . Cholecalciferol (VITAMIN D3) 400 units CAPS Take 400 Units by mouth daily.  Marland Kitchen ezetimibe (ZETIA) 10 MG tablet Take 10 mg by mouth daily.   . Fiber POWD Take 1 Scoop by mouth daily.  Marland Kitchen ibuprofen (ADVIL,MOTRIN) 600 MG tablet Take 1 tablet (600 mg total) by mouth every 6 (six) hours as needed.  Marland Kitchen levothyroxine (SYNTHROID, LEVOTHROID) 88 MCG tablet Take 88 mcg by mouth daily before breakfast.   . lisinopril (PRINIVIL,ZESTRIL) 10 MG tablet Take 1 tablet by mouth daily.  . Multiple Vitamin (MULTIVITAMIN WITH MINERALS) TABS tablet Take 1 tablet by mouth daily.  Marland Kitchen omeprazole (PRILOSEC) 20 MG capsule Take 20 mg by mouth daily.  . polyethylene glycol powder (GLYCOLAX/MIRALAX) 17 GM/SCOOP powder polyethylene glycol 3350 17 gram/dose oral powder  . PREMARIN vaginal cream      Allergies:   Patient has no known allergies.   Social History   Socioeconomic History  . Marital status: Married    Spouse name: Not on file  . Number of children: Not on file  . Years of education: Not on file  . Highest education level: Not on file  Occupational History  . Not on file  Tobacco Use  . Smoking status: Never Smoker  . Smokeless tobacco: Never Used  Substance and Sexual Activity  . Alcohol use: No  . Drug use: No  . Sexual activity: Not on file  Other Topics Concern  . Not on file  Social History Narrative   Lives alone,retired.   Husband deceased   Social Determinants of Health   Financial Resource Strain:   . Difficulty of Paying Living Expenses:   Food Insecurity:   . Worried About Charity fundraiser in the Last Year:   . Arboriculturist in the Last Year:   Transportation Needs:   . Film/video editor (Medical):   Marland Kitchen Lack of Transportation (Non-Medical):   Physical Activity:   . Days of Exercise per Week:   . Minutes of Exercise per Session:   Stress:   . Feeling of Stress :   Social Connections:   . Frequency of Communication with Friends  and Family:   . Frequency of Social Gatherings with Friends and Family:   . Attends Religious Services:   . Active Member of Clubs or Organizations:   . Attends Archivist Meetings:   Marland Kitchen Marital Status:      Family History: The patient's family history includes Depression in her father; Heart failure in her mother; Kidney cancer in her brother; Leukemia in her brother.  ROS:   Please see the history of present illness.    All other systems reviewed and are negative.  EKGs/Labs/Other Studies Reviewed:    The following studies were reviewed today:  EKG done today reveals sinus rhythm and intraventricular conduction delay. Study Highlights   The left ventricular ejection fraction is hyperdynamic (>65%).  Nuclear stress EF: 77%.  Blood pressure demonstrated a normal response to exercise.  There was no ST segment deviation noted during stress.  Defect 1: There is a small defect of mild severity present in the apex location.  This is a low risk study.  No evidence of ischemia or MI.  Normal EF.      Recent Labs: No results found for requested labs within last 8760 hours.  Recent Lipid Panel    Component Value Date/Time   CHOL 136 03/11/2018 0808   TRIG 157 (H) 03/11/2018 0808   HDL 36 (L) 03/11/2018 0808   CHOLHDL 3.8 03/11/2018 0808   LDLCALC 69 03/11/2018 0808    Physical Exam:    VS:  BP 130/82   Pulse 60   Temp 97.8 F (36.6 C)   Ht 5\' 3"  (1.6 m)   Wt 134 lb 9.6 oz (61.1 kg)   SpO2 97%   BMI 23.84 kg/m     Wt Readings from Last 3 Encounters:  06/14/19 134 lb 9.6 oz (61.1 kg)  11/26/18 137 lb (62.1 kg)  03/31/18 137 lb (62.1 kg)     GEN: Patient is in no acute distress HEENT: Normal NECK: No JVD; No carotid bruits LYMPHATICS: No lymphadenopathy CARDIAC: Hear sounds regular, 2/6 systolic murmur at the apex. RESPIRATORY:  Clear to auscultation without rales, wheezing or rhonchi  ABDOMEN: Soft, non-tender,  non-distended MUSCULOSKELETAL:  No edema; No deformity  SKIN: Warm and dry NEUROLOGIC:  Alert and oriented x 3 PSYCHIATRIC:  Normal affect   Signed, Jenean Lindau, MD  06/14/2019 8:50 AM    [place]

## 2019-06-14 NOTE — Patient Instructions (Addendum)

## 2019-08-08 DIAGNOSIS — M898X1 Other specified disorders of bone, shoulder: Secondary | ICD-10-CM | POA: Diagnosis not present

## 2019-08-08 DIAGNOSIS — M19011 Primary osteoarthritis, right shoulder: Secondary | ICD-10-CM | POA: Diagnosis not present

## 2019-08-08 DIAGNOSIS — R936 Abnormal findings on diagnostic imaging of limbs: Secondary | ICD-10-CM | POA: Diagnosis not present

## 2019-08-08 DIAGNOSIS — M25511 Pain in right shoulder: Secondary | ICD-10-CM | POA: Diagnosis not present

## 2019-09-20 DIAGNOSIS — S46811A Strain of other muscles, fascia and tendons at shoulder and upper arm level, right arm, initial encounter: Secondary | ICD-10-CM | POA: Diagnosis not present

## 2019-09-27 DIAGNOSIS — M542 Cervicalgia: Secondary | ICD-10-CM | POA: Diagnosis not present

## 2019-09-27 DIAGNOSIS — M25511 Pain in right shoulder: Secondary | ICD-10-CM | POA: Diagnosis not present

## 2019-09-27 DIAGNOSIS — M6281 Muscle weakness (generalized): Secondary | ICD-10-CM | POA: Diagnosis not present

## 2019-09-29 DIAGNOSIS — M6281 Muscle weakness (generalized): Secondary | ICD-10-CM | POA: Diagnosis not present

## 2019-09-29 DIAGNOSIS — M25511 Pain in right shoulder: Secondary | ICD-10-CM | POA: Diagnosis not present

## 2019-09-29 DIAGNOSIS — M542 Cervicalgia: Secondary | ICD-10-CM | POA: Diagnosis not present

## 2019-10-03 DIAGNOSIS — M6281 Muscle weakness (generalized): Secondary | ICD-10-CM | POA: Diagnosis not present

## 2019-10-03 DIAGNOSIS — M542 Cervicalgia: Secondary | ICD-10-CM | POA: Diagnosis not present

## 2019-10-03 DIAGNOSIS — M25511 Pain in right shoulder: Secondary | ICD-10-CM | POA: Diagnosis not present

## 2019-10-06 DIAGNOSIS — M6281 Muscle weakness (generalized): Secondary | ICD-10-CM | POA: Diagnosis not present

## 2019-10-06 DIAGNOSIS — M25511 Pain in right shoulder: Secondary | ICD-10-CM | POA: Diagnosis not present

## 2019-10-06 DIAGNOSIS — M542 Cervicalgia: Secondary | ICD-10-CM | POA: Diagnosis not present

## 2019-10-10 DIAGNOSIS — M6281 Muscle weakness (generalized): Secondary | ICD-10-CM | POA: Diagnosis not present

## 2019-10-10 DIAGNOSIS — M25511 Pain in right shoulder: Secondary | ICD-10-CM | POA: Diagnosis not present

## 2019-10-10 DIAGNOSIS — M542 Cervicalgia: Secondary | ICD-10-CM | POA: Diagnosis not present

## 2019-10-17 ENCOUNTER — Telehealth: Payer: Self-pay | Admitting: Cardiology

## 2019-10-17 DIAGNOSIS — M6281 Muscle weakness (generalized): Secondary | ICD-10-CM | POA: Diagnosis not present

## 2019-10-17 DIAGNOSIS — M542 Cervicalgia: Secondary | ICD-10-CM | POA: Diagnosis not present

## 2019-10-17 DIAGNOSIS — M25511 Pain in right shoulder: Secondary | ICD-10-CM | POA: Diagnosis not present

## 2019-10-17 NOTE — Telephone Encounter (Signed)
Watch salt intake with diet also watch fluid intake.  Give appointment in the next several days to come and see me.

## 2019-10-17 NOTE — Telephone Encounter (Signed)
Pt c/o swelling: STAT is pt has developed SOB within 24 hours  1) How much weight have you gained and in what time span? 3-5 lbs within the last couple of weeks  2) If swelling, where is the swelling located? Feet and legs  3) Are you currently taking a fluid pill? No  4) Are you currently SOB? No  5) Do you have a log of your daily weights (if so, list)? No  6) Have you gained 3 pounds in a day or 5 pounds in a week? No   7) Have you traveled recently? No   Patient states she is a little concerned since she had open heart surgery in 2019. She wants to know if Dr. Geraldo Pitter wants her to come in

## 2019-10-17 NOTE — Telephone Encounter (Signed)
Pt has an appointment 8/24.

## 2019-10-18 ENCOUNTER — Other Ambulatory Visit: Payer: Self-pay

## 2019-10-18 ENCOUNTER — Ambulatory Visit (INDEPENDENT_AMBULATORY_CARE_PROVIDER_SITE_OTHER): Payer: Medicare Other | Admitting: Cardiology

## 2019-10-18 ENCOUNTER — Encounter: Payer: Self-pay | Admitting: Cardiology

## 2019-10-18 VITALS — BP 152/72 | HR 80 | Ht 62.0 in | Wt 142.4 lb

## 2019-10-18 DIAGNOSIS — E785 Hyperlipidemia, unspecified: Secondary | ICD-10-CM

## 2019-10-18 DIAGNOSIS — S46811A Strain of other muscles, fascia and tendons at shoulder and upper arm level, right arm, initial encounter: Secondary | ICD-10-CM | POA: Diagnosis not present

## 2019-10-18 DIAGNOSIS — I10 Essential (primary) hypertension: Secondary | ICD-10-CM

## 2019-10-18 DIAGNOSIS — I2510 Atherosclerotic heart disease of native coronary artery without angina pectoris: Secondary | ICD-10-CM

## 2019-10-18 DIAGNOSIS — Z951 Presence of aortocoronary bypass graft: Secondary | ICD-10-CM

## 2019-10-18 MED ORDER — NITROGLYCERIN 0.4 MG SL SUBL: 0.4000 mg | SUBLINGUAL_TABLET | SUBLINGUAL | 11 refills | Status: DC | PRN

## 2019-10-18 MED ORDER — FUROSEMIDE 40 MG PO TABS: 40.0000 mg | ORAL_TABLET | Freq: Every day | ORAL | 3 refills | Status: DC

## 2019-10-18 NOTE — Progress Notes (Signed)
Cardiology Office Note:    Date:  10/18/2019   ID:  Brittany Santiago, DOB Jun 06, 1930, MRN [id number]  PCP:  Nicoletta Dress, MD  Cardiologist:  Jenean Lindau, MD   Referring MD: Nicoletta Dress, MD    ASSESSMENT:    1. Coronary artery disease involving native coronary artery of native heart without angina pectoris   2. Essential hypertension   3. Dyslipidemia   4. S/P CABG x 2    PLAN:    In order of problems listed above:  1. Coronary artery disease: Secondary prevention stressed with the patient.  Importance of compliance with diet medication stressed and she vocalized understanding.  Diet was emphasized.  Salt intake issues were discussed. 2. Bilateral pedal edema: It is pitting in nature.  Have also initiated patient on furosemide 40 mg daily.  I have stopped amlodipine because of blood pressure issues and I am concerned about possibility of low blood pressure in this patient.  She will have Chem-7 today.  Also echocardiogram will be done to assess murmur on auscultation.  I am also concerned whether this is an element of congestive heart failure and we will do a Lexiscan sestamibi in view of patient's coronary artery disease and post CABG surgery. 3. Mixed dyslipidemia: Diet was emphasized and she vocalized understanding.  She knows to go to nearest emergency room for any concerning symptoms.  Follow-up appointment in a month or earlier if she has any concerns. 4. Sublingual nitroglycerin prescription was sent, its protocol and 911 protocol explained and the patient vocalized understanding questions were answered to the patient's satisfaction   Medication Adjustments/Labs and Tests Ordered: Current medicines are reviewed at length with the patient today.  Concerns regarding medicines are outlined above.  No orders of the defined types were placed in this encounter.  No orders of the defined types were placed in this encounter.    No chief complaint on file.     History of Present Illness:    Brittany Santiago is a 86 y.o. female.  Patient has past medical history of coronary artery disease post CABG surgery, essential hypertension and mixed dyslipidemia.  She mentions to me that she had an episode of chest tightness a few days ago and it passed away.  She did not take nitroglycerin as it was expired.  Subsequently she has had no problem she walks about 15 minutes a day without any problems.  However she has had some pedal edema in the past similar period of time.  At the time of my evaluation, the patient is alert awake oriented and in no distress.  Past Medical History:  Diagnosis Date  . Asthma, moderate persistent 08/02/2013   6/9/2015S[facility]: normal CXR normal  ; DENIS , STATES I HAD BRONCHITIS BEFORE AND USED AN INHALER TEMPORARILY   . Cancer [facility])    endometrial   . Coronary artery disease involving native coronary artery of native heart without angina pectoris 03/16/2015  . Coronary atherosclerosis of native coronary artery 03/16/2015  . Dyslipidemia 03/10/2017  . Endometrial cancer ([place]) 05/27/2017  . 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 Female bladder prolapse   . Hyperlipidemia   . Hypertension   . Hypothyroidism   . Left bundle branch block (LBBB) 03/04/2017  . Menopausal symptom 05/29/2017  . Pre-operative cardiovascular examination 05/29/2017  . 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 Seasonal allergies   . Thyroid disease   . 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 Added automatically from request for surgery [phone]    Past Surgical History:  Procedure Laterality Date  . ABDOMINAL HYSTERECTOMY  2019  . APPENDECTOMY     yaken with first c section   . CESAREAN SECTION     x2   . CORONARY ARTERY BYPASS GRAFT  02/2015   DOUBLE BYPASS ; DONE AT  [facility]    . EYE SURGERY  1999   bilateral cataract extraction   . JOINT REPLACEMENT Right    hip  . KNEE ARTHROSCOPY Left   . neck back surgery  2009   . ROBOTIC ASSISTED TOTAL HYSTERECTOMY WITH BILATERAL SALPINGO OOPHERECTOMY Bilateral 06/15/2017   Procedure: XI ROBOTIC ASSISTED TOTAL HYSTERECTOMY WITH BILATERAL SALPINGO OOPHORECTOMY WITH SENTINAL LYMPH NODES, PELVIC LYMPHADENECTOMY, AND LYSIS OF ADHESIONS;  Surgeon: Everitt Amber, MD;  Location: [facility];  Service: Gynecology;  Laterality: Bilateral;  . ROTATOR CUFF REPAIR Left   . THYROID SURGERY    . THYROIDECTOMY  2000s    Current Medications: Current Meds  Medication Sig  . acetaminophen (TYLENOL) 500 MG tablet Take 500 mg by mouth daily as needed for moderate pain or headache.  Marland Kitchen amLODipine (NORVASC) 5 MG tablet Take 5 mg by mouth at bedtime.   Marland Kitchen aspirin 81 MG tablet Take 81 mg by mouth at bedtime.   Marland Kitchen atorvastatin (LIPITOR) 80 MG tablet Take 80 mg by mouth at bedtime.   . Cholecalciferol (VITAMIN D3) 400 units CAPS Take 400 Units by mouth daily.  Marland Kitchen ezetimibe (ZETIA) 10 MG tablet Take 10 mg by mouth daily.   . Fiber POWD Take 1 Scoop by mouth daily.  Marland Kitchen ibuprofen (ADVIL,MOTRIN) 600 MG tablet Take 1 tablet (600 mg total) by mouth every 6 (six) hours as needed.  Marland Kitchen levothyroxine (SYNTHROID, LEVOTHROID) 88 MCG tablet Take 88 mcg by mouth daily before breakfast.   . lisinopril (PRINIVIL,ZESTRIL) 10 MG tablet Take 1 tablet by mouth daily.  . meloxicam (MOBIC) 15 MG tablet Take 15 mg by mouth daily.  . metoprolol succinate (TOPROL-XL) 50 MG 24 hr tablet Take 50 mg by mouth daily.  . Multiple Vitamin (MULTIVITAMIN WITH MINERALS) TABS tablet Take 1 tablet by mouth daily.  Marland Kitchen omeprazole (PRILOSEC) 20 MG capsule Take 20 mg by mouth daily.  Marland Kitchen PREMARIN vaginal cream      Allergies:   Patient has no known allergies.   Social History   Socioeconomic History  . Marital status: Married    Spouse name: Not on file  . Number of children: Not on file  . Years of education: Not on file  . Highest education level: Not on file  Occupational History  . Not on file  Tobacco Use  . Smoking status:  Never Smoker  . Smokeless tobacco: Never Used  Vaping Use  . Vaping Use: Never used  Substance and Sexual Activity  . Alcohol use: No  . Drug use: No  . Sexual activity: Not on file  Other Topics Concern  . Not on file  Social History Narrative   Lives alone,retired.   Husband deceased   Social Determinants of Health   Financial Resource Strain:   . Difficulty of Paying Living Expenses: Not on file  Food Insecurity:   . Worried About Charity fundraiser in the Last Year: Not on file  . Ran Out of Food in the Last Year: Not on file  Transportation Needs:   . Lack of Transportation (Medical): Not on file  .  Lack of Transportation (Non-Medical): Not on file  Physical Activity:   . Days of Exercise per Week: Not on file  . Minutes of Exercise per Session: Not on file  Stress:   . Feeling of Stress : Not on file  Social Connections:   . Frequency of Communication with Friends and Family: Not on file  . Frequency of Social Gatherings with Friends and Family: Not on file  . Attends Religious Services: Not on file  . Active Member of Clubs or Organizations: Not on file  . Attends Archivist Meetings: Not on file  . Marital Status: Not on file     Family History: The patient's family history includes Depression in her father; Heart failure in her mother; Kidney cancer in her brother; Leukemia in her brother.  ROS:   Please see the history of present illness.    All other systems reviewed and are negative.  EKGs/Labs/Other Studies Reviewed:    The following studies were reviewed today: EKG reveals sinus rhythm and nonspecific ST-T changes and EKG reviewed from the past which has intraventricular conduction delay   Recent Labs: No results found for requested labs within last 8760 hours.  Recent Lipid Panel    Component Value Date/Time   CHOL 136 03/11/2018 0808   TRIG 157 (H) 03/11/2018 0808   HDL 36 (L) 03/11/2018 0808   CHOLHDL 3.8 03/11/2018 0808    LDLCALC 69 03/11/2018 0808    Physical Exam:    VS:  BP (!) 152/72   Pulse 80   Ht 5\' 2"  (1.575 m)   Wt 142 lb 6.4 oz (64.6 kg)   SpO2 98%   BMI 26.05 kg/m     Wt Readings from Last 3 Encounters:  10/18/19 142 lb 6.4 oz (64.6 kg)  06/14/19 134 lb 9.6 oz (61.1 kg)  11/26/18 137 lb (62.1 kg)     GEN: Patient is in no acute distress HEENT: Normal NECK: No JVD; No carotid bruits LYMPHATICS: No lymphadenopathy CARDIAC: Hear sounds regular, 2/6 systolic murmur at the apex. RESPIRATORY:  Clear to auscultation without rales, wheezing or rhonchi  ABDOMEN: Soft, non-tender, non-distended MUSCULOSKELETAL:  No edema; No deformity  SKIN: Warm and dry NEUROLOGIC:  Alert and oriented x 3 PSYCHIATRIC:  Normal affect   Signed, Jenean Lindau, MD  10/18/2019 4:19 PM    [facility]

## 2019-10-18 NOTE — Patient Instructions (Addendum)
Medication Instructions:  Your physician has recommended you make the following change in your medication:   Stop Amlodipine Start furosemide 40 mg daily. Take nitroglycerin as needed.  *If you need a refill on your cardiac medications before your next appointment, please call your pharmacy*   Lab Work: You had a BMET done today. If you have labs (blood work) drawn today and your tests are completely normal, you will receive your results only by: Marland Kitchen MyChart Message (if you have MyChart) OR . A paper copy in the mail If you have any lab test that is abnormal or we need to change your treatment, we will call you to review the results.   Testing/Procedures: Your physician has requested that you have an echocardiogram. Echocardiography is a painless test that uses sound waves to create images of your heart. It provides your doctor with information about the size and shape of your heart and how well your heart's chambers and valves are working. This procedure takes approximately one hour. There are no restrictions for this procedure.  Your physician has requested that you have a lexiscan myoview. For further information please visit [email or web address]. Please follow instruction sheet, as given.  The test will take approximately 3 to 4 hours to complete; you may bring reading material.  If someone comes with you to your appointment, they will need to remain in the main lobby due to limited space in the testing area.   How to prepare for your Myocardial Perfusion Test: . Do not eat or drink 3 hours prior to your test, except you may have water. . Do not consume products containing caffeine (regular or decaffeinated) 12 hours prior to your test. (ex: coffee, chocolate, sodas, tea). . Do bring a list of your current medications with you.  If not listed below, you may take your medications as normal. . Do wear comfortable clothes (no dresses or overalls) and walking shoes, tennis shoes preferred  (No heels or open toe shoes are allowed). . Do NOT wear cologne, perfume, aftershave, or lotions (deodorant is allowed). . If these instructions are not followed, your test will have to be rescheduled.    Follow-Up: At [facility], you and your health needs are our priority.  As part of our continuing mission to provide you with exceptional heart care, we have created designated Provider Care Teams.  These Care Teams include your primary Cardiologist (physician) and Advanced Practice Providers (APPs -  Physician Assistants and Nurse Practitioners) who all work together to provide you with the care you need, when you need it.  We recommend signing up for the patient portal called "MyChart".  Sign up information is provided on this After Visit Summary.  MyChart is used to connect with patients for Virtual Visits (Telemedicine).  Patients are able to view lab/test results, encounter notes, upcoming appointments, etc.  Non-urgent messages can be sent to your provider as well.   To learn more about what you can do with MyChart, go to [email or web address].    Your next appointment:   1 month(s)  The format for your next appointment:   In Person  Provider:   Jyl Heinz, MD   Other Instructions Furosemide Oral Tablets What is this medicine? FUROSEMIDE (fyoor [place] se mide) is a diuretic. It helps you make more urine and to lose salt and excess water from your body. It treats swelling from heart, kidney, or liver disease. It also treats high blood pressure. This medicine may be used for other purposes; ask  your health care provider or pharmacist if you have questions. COMMON BRAND NAME(S): Active-Medicated Specimen Kit, Delone, Diuscreen, Lasix, RX Specimen Collection Kit, Specimen Collection Kit, URINX Medicated Specimen Collection What should I tell my health care provider before I take this medicine? They need to know if you have any of these conditions:  abnormal blood  electrolytes  diarrhea or vomiting  gout  heart disease  kidney disease, small amounts of urine, or difficulty passing urine  liver disease  thyroid disease  an unusual or allergic reaction to furosemide, sulfa drugs, other medicines, foods, dyes, or preservatives  pregnant or trying to get pregnant  breast-feeding How should I use this medicine? Take this drug by mouth. Take it as directed on the prescription label at the same time every day. You can take it with or without food. If it upsets your stomach, take it with food. Keep taking it unless your health care provider tells you to stop. Talk to your health care provider about the use of this drug in children. Special care may be needed. Overdosage: If you think you have taken too much of this medicine contact a poison control center or emergency room at once. NOTE: This medicine is only for you. Do not share this medicine with others. What if I miss a dose? If you miss a dose, take it as soon as you can. If it is almost time for your next dose, take only that dose. Do not take double or extra doses. What may interact with this medicine?  aspirin and aspirin-like medicines  certain antibiotics  chloral hydrate  cisplatin  cyclosporine  digoxin  diuretics  laxatives  lithium  medicines for blood pressure  medicines that relax muscles for surgery  methotrexate  NSAIDs, medicines for pain and inflammation like ibuprofen, naproxen, or indomethacin  phenytoin  steroid medicines like prednisone or cortisone  sucralfate  thyroid hormones This list may not describe all possible interactions. Give your health care provider a list of all the medicines, herbs, non-prescription drugs, or dietary supplements you use. Also tell them if you smoke, drink alcohol, or use illegal drugs. Some items may interact with your medicine. What should I watch for while using this medicine? Visit your doctor or health care  provider for regular checks on your progress. Check your blood pressure regularly. Ask your doctor or health care provider what your blood pressure should be, and when you should contact him or her. If you are a diabetic, check your blood sugar as directed. This medicine may cause serious skin reactions. They can happen weeks to months after starting the medicine. Contact your health care provider right away if you notice fevers or flu-like symptoms with a rash. The rash may be red or purple and then turn into blisters or peeling of the skin. Or, you might notice a red rash with swelling of the face, lips or lymph nodes in your neck or under your arms. You may need to be on a special diet while taking this medicine. Check with your doctor. Also, ask how many glasses of fluid you need to drink a day. You must not get dehydrated. You may get drowsy or dizzy. Do not drive, use machinery, or do anything that needs mental alertness until you know how this drug affects you. Do not stand or sit up quickly, especially if you are an older patient. This reduces the risk of dizzy or fainting spells. Alcohol can make you more drowsy and dizzy. Avoid alcoholic  drinks. This medicine can make you more sensitive to the sun. Keep out of the sun. If you cannot avoid being in the sun, wear protective clothing and use sunscreen. Do not use sun lamps or tanning beds/booths. What side effects may I notice from receiving this medicine? Side effects that you should report to your doctor or health care professional as soon as possible:  blood in urine or stools  dry mouth  fever or chills  hearing loss or ringing in the ears  irregular heartbeat  muscle pain or weakness, cramps  rash, fever, and swollen lymph nodes  redness, blistering, peeling or loosening of the skin, including inside the mouth  skin rash  stomach upset, pain, or nausea  tingling or numbness in the hands or feet  unusually weak or  tired  vomiting or diarrhea  yellowing of the eyes or skin Side effects that usually do not require medical attention (report to your doctor or health care professional if they continue or are bothersome):  headache  loss of appetite  unusual bleeding or bruising This list may not describe all possible side effects. Call your doctor for medical advice about side effects. You may report side effects to FDA at 1-800-FDA-1088. Where should I keep my medicine? Keep out of the reach of children and pets. Store at room temperature between 20 and 25 degrees C (68 and 77 degrees F). Protect from light and moisture. Keep the container tightly closed. Throw away any unused drug after the expiration date. NOTE: This sheet is a summary. It may not cover all possible information. If you have questions about this medicine, talk to your doctor, pharmacist, or health care provider.  2020 Elsevier/Gold Standard (2018-09-28 18:01:32)  Cardiac Nuclear Scan A cardiac nuclear scan is a test that is done to check the flow of blood to your heart. It is done when you are resting and when you are exercising. The test looks for problems such as:  Not enough blood reaching a portion of the heart.  The heart muscle not working as it should. You may need this test if:  You have heart disease.  You have had lab results that are not normal.  You have had heart surgery or a balloon procedure to open up blocked arteries (angioplasty).  You have chest pain.  You have shortness of breath. In this test, a special dye (tracer) is put into your bloodstream. The tracer will travel to your heart. A camera will then take pictures of your heart to see how the tracer moves through your heart. This test is usually done at a hospital and takes 2-4 hours. Tell a doctor about:  Any allergies you have.  All medicines you are taking, including vitamins, herbs, eye drops, creams, and over-the-counter medicines.  Any  problems you or family members have had with anesthetic medicines.  Any blood disorders you have.  Any surgeries you have had.  Any medical conditions you have.  Whether you are pregnant or may be pregnant. What are the risks? Generally, this is a safe test. However, problems may occur, such as:  Serious chest pain and heart attack. This is only a risk if the stress portion of the test is done.  Rapid heartbeat.  A feeling of warmth in your chest. This feeling usually does not last long.  Allergic reaction to the tracer. What happens before the test?  Ask your doctor about changing or stopping your normal medicines. This is important.  Follow instructions  from your doctor about what you cannot eat or drink.  Remove your jewelry on the day of the test. What happens during the test?  An IV tube will be inserted into one of your veins.  Your doctor will give you a small amount of tracer through the IV tube.  You will wait for 20-40 minutes while the tracer moves through your bloodstream.  Your heart will be monitored with an electrocardiogram (ECG).  You will lie down on an exam table.  Pictures of your heart will be taken for about 15-20 minutes.  You may also have a stress test. For this test, one of these things may be done: ? You will be asked to exercise on a treadmill or a stationary bike. ? You will be given medicines that will make your heart work harder. This is done if you are unable to exercise.  When blood flow to your heart has peaked, a tracer will again be given through the IV tube.  After 20-40 minutes, you will get back on the exam table. More pictures will be taken of your heart.  Depending on the tracer that is used, more pictures may need to be taken 3-4 hours later.  Your IV tube will be removed when the test is over. The test may vary among doctors and hospitals. What happens after the test?  Ask your doctor: ? Whether you can return to your  normal schedule, including diet, activities, and medicines. ? Whether you should drink more fluids. This will help to remove the tracer from your body. Drink enough fluid to keep your pee (urine) pale yellow.  Ask your doctor, or the department that is doing the test: ? When will my results be ready? ? How will I get my results? Summary  A cardiac nuclear scan is a test that is done to check the flow of blood to your heart.  Tell your doctor whether you are pregnant or may be pregnant.  Before the test, ask your doctor about changing or stopping your normal medicines. This is important.  Ask your doctor whether you can return to your normal activities. You may be asked to drink more fluids. This information is not intended to replace advice given to you by your health care provider. Make sure you discuss any questions you have with your health care provider. Document Revised: 06/02/2018 Document Reviewed: 07/27/2017 Elsevier Patient Education  [place].  Echocardiogram An echocardiogram is a procedure that uses painless sound waves (ultrasound) to produce an image of the heart. Images from an echocardiogram can provide important information about:  Signs of coronary artery disease (CAD).  Aneurysm detection. An aneurysm is a weak or damaged part of an artery wall that bulges out from the normal force of blood pumping through the body.  Heart size and shape. Changes in the size or shape of the heart can be associated with certain conditions, including heart failure, aneurysm, and CAD.  Heart muscle function.  Heart valve function.  Signs of a past heart attack.  Fluid buildup around the heart.  Thickening of the heart muscle.  A tumor or infectious growth around the heart valves. Tell a health care provider about:  Any allergies you have.  All medicines you are taking, including vitamins, herbs, eye drops, creams, and over-the-counter medicines.  Any blood  disorders you have.  Any surgeries you have had.  Any medical conditions you have.  Whether you are pregnant or may be pregnant. What are  the risks? Generally, this is a safe procedure. However, problems may occur, including:  Allergic reaction to dye (contrast) that may be used during the procedure. What happens before the procedure? No specific preparation is needed. You may eat and drink normally. What happens during the procedure?   An IV tube may be inserted into one of your veins.  You may receive contrast through this tube. A contrast is an injection that improves the quality of the pictures from your heart.  A gel will be applied to your chest.  A wand-like tool (transducer) will be moved over your chest. The gel will help to transmit the sound waves from the transducer.  The sound waves will harmlessly bounce off of your heart to allow the heart images to be captured in real-time motion. The images will be recorded on a computer. The procedure may vary among health care providers and hospitals. What happens after the procedure?  You may return to your normal, everyday life, including diet, activities, and medicines, unless your health care provider tells you not to do that. Summary  An echocardiogram is a procedure that uses painless sound waves (ultrasound) to produce an image of the heart.  Images from an echocardiogram can provide important information about the size and shape of your heart, heart muscle function, heart valve function, and fluid buildup around your heart.  You do not need to do anything to prepare before this procedure. You may eat and drink normally.  After the echocardiogram is completed, you may return to your normal, everyday life, unless your health care provider tells you not to do that. This information is not intended to replace advice given to you by your health care provider. Make sure you discuss any questions you have with your health  care provider. Document Revised: 06/03/2018 Document Reviewed: 03/15/2016 Elsevier Patient Education  [place].

## 2019-10-19 LAB — BASIC METABOLIC PANEL
BUN/Creatinine Ratio: 16 (ref 12–28)
BUN: 16 mg/dL (ref 8–27)
CO2: 24 mmol/L (ref 20–29)
Calcium: 9.2 mg/dL (ref 8.7–10.3)
Chloride: 103 mmol/L (ref 96–106)
Creatinine, Ser: 0.99 mg/dL (ref 0.57–1.00)
GFR calc Af Amer: 60 mL/min/{1.73_m2} (ref >59)
GFR calc non Af Amer: 52 mL/min/{1.73_m2} — ABNORMAL LOW (ref >59)
Glucose: 82 mg/dL (ref 65–99)
Potassium: 3.9 mmol/L (ref 3.5–5.2)
Sodium: 139 mmol/L (ref 134–144)

## 2019-10-19 MED ORDER — NITROGLYCERIN 0.4 MG SL SUBL: 0.4000 mg | SUBLINGUAL_TABLET | SUBLINGUAL | 11 refills | Status: DC | PRN

## 2019-10-19 NOTE — Addendum Note (Signed)
Addended by: Truddie Hidden on: 10/19/2019 03:08 PM   Modules accepted: Orders

## 2019-10-20 ENCOUNTER — Other Ambulatory Visit: Payer: Self-pay

## 2019-10-20 MED ORDER — NITROGLYCERIN 0.4 MG SL SUBL: 0.4000 mg | SUBLINGUAL_TABLET | SUBLINGUAL | 11 refills | Status: DC | PRN

## 2019-10-20 NOTE — Telephone Encounter (Signed)
Refill for [employer] sent to [facility].

## 2019-10-20 NOTE — Addendum Note (Signed)
Addended by: Herron Fero, Jonelle Sidle L on: 10/20/2019 10:47 AM   Modules accepted: Orders

## 2019-10-26 ENCOUNTER — Telehealth (HOSPITAL_COMMUNITY): Payer: Self-pay | Admitting: *Deleted

## 2019-10-26 NOTE — Telephone Encounter (Signed)
Patient given detailed instructions per Myocardial Perfusion Study Information Sheet for the test on 11/01/19 at 11:15. Patient notified to arrive 15 minutes early and that it is imperative to arrive on time for appointment to keep from having the test rescheduled.  If you need to cancel or reschedule your appointment, please call the office within 24 hours of your appointment. . Patient verbalized understanding.Brittany Santiago

## 2019-11-01 ENCOUNTER — Other Ambulatory Visit: Payer: Self-pay

## 2019-11-01 ENCOUNTER — Telehealth: Payer: Self-pay | Admitting: Cardiology

## 2019-11-01 ENCOUNTER — Ambulatory Visit (INDEPENDENT_AMBULATORY_CARE_PROVIDER_SITE_OTHER): Payer: Medicare Other

## 2019-11-01 DIAGNOSIS — I10 Essential (primary) hypertension: Secondary | ICD-10-CM | POA: Diagnosis not present

## 2019-11-01 DIAGNOSIS — I2510 Atherosclerotic heart disease of native coronary artery without angina pectoris: Secondary | ICD-10-CM | POA: Diagnosis not present

## 2019-11-01 DIAGNOSIS — Z951 Presence of aortocoronary bypass graft: Secondary | ICD-10-CM | POA: Diagnosis not present

## 2019-11-01 LAB — MYOCARDIAL PERFUSION IMAGING
LV dias vol: 67 mL (ref 46–106)
LV sys vol: 23 mL
Peak HR: 91 {beats}/min
Rest HR: 72 {beats}/min
SDS: 0
SRS: 1
SSS: 1
TID: 1.06

## 2019-11-01 MED ORDER — TECHNETIUM TC 99M TETROFOSMIN IV KIT
31.4000 | PACK | Freq: Once | INTRAVENOUS | Status: AC | PRN
  Administered 2019-11-01: 31.4 via INTRAVENOUS

## 2019-11-01 MED ORDER — REGADENOSON 0.4 MG/5ML IV SOLN
0.4000 mg | Freq: Once | INTRAVENOUS | Status: AC
  Administered 2019-11-01: 0.4 mg via INTRAVENOUS

## 2019-11-01 MED ORDER — TECHNETIUM TC 99M TETROFOSMIN IV KIT
10.5000 | PACK | Freq: Once | INTRAVENOUS | Status: AC | PRN
  Administered 2019-11-01: 10.5 via INTRAVENOUS

## 2019-11-01 NOTE — Telephone Encounter (Signed)
Called patient informed her that per her last visit and instructions there was nothing she needed to hold of medications before test. I revied her medications as well. She takes metoprolol at night so that isn't of concern. No further questions.

## 2019-11-01 NOTE — Telephone Encounter (Signed)
Patient is calling to speak with Dr. Julien Nordmann nurse in regards to her stress test today. Please advise.

## 2019-11-09 DIAGNOSIS — N390 Urinary tract infection, site not specified: Secondary | ICD-10-CM | POA: Diagnosis not present

## 2019-11-09 DIAGNOSIS — Z1231 Encounter for screening mammogram for malignant neoplasm of breast: Secondary | ICD-10-CM | POA: Diagnosis not present

## 2019-11-11 ENCOUNTER — Other Ambulatory Visit: Payer: Self-pay

## 2019-11-11 ENCOUNTER — Ambulatory Visit (INDEPENDENT_AMBULATORY_CARE_PROVIDER_SITE_OTHER): Payer: Medicare Other

## 2019-11-11 DIAGNOSIS — I2510 Atherosclerotic heart disease of native coronary artery without angina pectoris: Secondary | ICD-10-CM

## 2019-11-11 LAB — ECHOCARDIOGRAM COMPLETE
AR max vel: 1.09 cm2
AV Area VTI: 1.17 cm2
AV Area mean vel: 1.17 cm2
AV Mean grad: 9 mmHg
AV Peak grad: 17.3 mmHg
Ao pk vel: 2.08 m/s
Area-P 1/2: 2.37 cm2
P 1/2 time: 428 msec
S' Lateral: 3 cm

## 2019-11-11 NOTE — Progress Notes (Signed)
Complete echocardiogram performed.  Jimmy Rajanee Schuelke RDCS, RVT  

## 2019-11-14 ENCOUNTER — Other Ambulatory Visit: Payer: Self-pay

## 2019-11-14 DIAGNOSIS — C801 Malignant (primary) neoplasm, unspecified: Secondary | ICD-10-CM | POA: Insufficient documentation

## 2019-11-14 DIAGNOSIS — N8110 Cystocele, unspecified: Secondary | ICD-10-CM | POA: Insufficient documentation

## 2019-11-15 ENCOUNTER — Other Ambulatory Visit: Payer: Self-pay

## 2019-11-15 ENCOUNTER — Ambulatory Visit (INDEPENDENT_AMBULATORY_CARE_PROVIDER_SITE_OTHER): Payer: Medicare Other | Admitting: Cardiology

## 2019-11-15 ENCOUNTER — Encounter: Payer: Self-pay | Admitting: Cardiology

## 2019-11-15 VITALS — BP 156/68 | HR 84 | Ht 62.0 in | Wt 138.8 lb

## 2019-11-15 DIAGNOSIS — I2510 Atherosclerotic heart disease of native coronary artery without angina pectoris: Secondary | ICD-10-CM

## 2019-11-15 DIAGNOSIS — I350 Nonrheumatic aortic (valve) stenosis: Secondary | ICD-10-CM | POA: Insufficient documentation

## 2019-11-15 DIAGNOSIS — E782 Mixed hyperlipidemia: Secondary | ICD-10-CM | POA: Diagnosis not present

## 2019-11-15 DIAGNOSIS — I351 Nonrheumatic aortic (valve) insufficiency: Secondary | ICD-10-CM | POA: Insufficient documentation

## 2019-11-15 DIAGNOSIS — N289 Disorder of kidney and ureter, unspecified: Secondary | ICD-10-CM

## 2019-11-15 DIAGNOSIS — Z951 Presence of aortocoronary bypass graft: Secondary | ICD-10-CM | POA: Diagnosis not present

## 2019-11-15 HISTORY — DX: Nonrheumatic aortic (valve) insufficiency: I35.1

## 2019-11-15 HISTORY — DX: Nonrheumatic aortic (valve) stenosis: I35.0

## 2019-11-15 LAB — BASIC METABOLIC PANEL
BUN/Creatinine Ratio: 13 (ref 12–28)
BUN: 17 mg/dL (ref 8–27)
CO2: 26 mmol/L (ref 20–29)
Calcium: 9.3 mg/dL (ref 8.7–10.3)
Chloride: 99 mmol/L (ref 96–106)
Creatinine, Ser: 1.3 mg/dL — ABNORMAL HIGH (ref 0.57–1.00)
GFR calc Af Amer: 43 mL/min/{1.73_m2} — ABNORMAL LOW (ref >59)
GFR calc non Af Amer: 37 mL/min/{1.73_m2} — ABNORMAL LOW (ref >59)
Glucose: 109 mg/dL — ABNORMAL HIGH (ref 65–99)
Potassium: 3.7 mmol/L (ref 3.5–5.2)
Sodium: 138 mmol/L (ref 134–144)

## 2019-11-15 NOTE — Patient Instructions (Signed)
Medication Instructions:  No medication changes. *If you need a refill on your cardiac medications before your next appointment, please call your pharmacy*   Lab Work: Your physician recommends that you had a BMET done today in the office.  If you have labs (blood work) drawn today and your tests are completely normal, you will receive your results only by: Marland Kitchen MyChart Message (if you have MyChart) OR . A paper copy in the mail If you have any lab test that is abnormal or we need to change your treatment, we will call you to review the results.   Testing/Procedures: None ordered   Follow-Up: At [facility], you and your health needs are our priority.  As part of our continuing mission to provide you with exceptional heart care, we have created designated Provider Care Teams.  These Care Teams include your primary Cardiologist (physician) and Advanced Practice Providers (APPs -  Physician Assistants and Nurse Practitioners) who all work together to provide you with the care you need, when you need it.  We recommend signing up for the patient portal called "MyChart".  Sign up information is provided on this After Visit Summary.  MyChart is used to connect with patients for Virtual Visits (Telemedicine).  Patients are able to view lab/test results, encounter notes, upcoming appointments, etc.  Non-urgent messages can be sent to your provider as well.   To learn more about what you can do with MyChart, go to [email or web address].    Your next appointment:   3 month(s)  The format for your next appointment:   In Person  Provider:   Jyl Heinz, MD   Other Instructions NA

## 2019-11-15 NOTE — Progress Notes (Signed)
Cardiology Office Note:    Date:  11/15/2019   ID:  Brittany Santiago, DOB [birth year], MRN [id number]  PCP:  Brittany Dress, MD  Cardiologist:  Brittany Lindau, MD   Referring MD: Brittany Dress, MD    ASSESSMENT:    1. Coronary artery disease involving native coronary artery of native heart without angina pectoris   2. Mixed hyperlipidemia   3. S/P CABG x 2   4. Mild aortic stenosis   5. Mild aortic regurgitation    PLAN:    In order of problems listed above:  1. Coronary artery disease: Secondary prevention stressed with the patient.  Importance of compliance with diet medication stressed and she vocalized understanding. 2. Essential hypertension: Blood pressure stable and diet was emphasized 3. Mixed dyslipidemia: Lipids were reviewed from [facility] sheet and they are fine. 4. Mild aortic stenosis and regurgitation: Stable at this time.  Medical management.  Results of stress test and echo was discussed with the patient at length and she vocalized understanding and questions were answered to her satisfaction. 5. Patient will be seen in follow-up appointment in 6 months or earlier if the patient has any concerns    Medication Adjustments/Labs and Tests Ordered: Current medicines are reviewed at length with the patient today.  Concerns regarding medicines are outlined above.  No orders of the defined types were placed in this encounter.  No orders of the defined types were placed in this encounter.    No chief complaint on file.    History of Present Illness:    Brittany Santiago is a 86 y.o. female.  Patient has past medical history of coronary artery disease, essential hypertension and dyslipidemia.  She was evaluated for pedal edema and congestive failure-like issues.  Echocardiogram and stress test was unremarkable.  She denies any chest pain orthopnea or PND.  She has responded well to withdrawal of amlodipine and initiation of diuretic.  At the time of my evaluation,  the patient is alert awake oriented and in no distress.  Past Medical History:  Diagnosis Date  . Asthma, moderate persistent 08/02/2013   6/9/2015[facility]: normal CXR normal  ; DENIS , STATES I HAD BRONCHITIS BEFORE AND USED AN INHALER TEMPORARILY   . Cancer [facility])    endometrial   . Coronary artery disease involving native coronary artery of native heart without angina pectoris 03/16/2015  . Coronary atherosclerosis of native coronary artery 03/16/2015  . Dyslipidemia 03/10/2017  . Endometrial cancer ([place]) 05/27/2017  . 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 Female bladder prolapse   . Hyperlipidemia   . Hypertension   . Hypothyroidism   . Left bundle branch block (LBBB) 03/04/2017  . Menopausal symptom 05/29/2017  . Pre-operative cardiovascular examination 05/29/2017  . 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 Seasonal allergies   . Thyroid disease   . 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 Added automatically from request for surgery [id number]    Past Surgical History:  Procedure Laterality Date  . ABDOMINAL HYSTERECTOMY  2019  . APPENDECTOMY     yaken with first c section   . CESAREAN SECTION     x2   . CORONARY ARTERY BYPASS GRAFT  02/2015   DOUBLE BYPASS ; DONE AT  [place]    . EYE SURGERY  1999   bilateral cataract extraction   . JOINT REPLACEMENT Right    hip  . KNEE ARTHROSCOPY Left   .  neck back surgery  2009  . ROBOTIC ASSISTED TOTAL HYSTERECTOMY WITH BILATERAL SALPINGO OOPHERECTOMY Bilateral 06/15/2017   Procedure: XI ROBOTIC ASSISTED TOTAL HYSTERECTOMY WITH BILATERAL SALPINGO OOPHORECTOMY WITH SENTINAL LYMPH NODES, PELVIC LYMPHADENECTOMY, AND LYSIS OF ADHESIONS;  Surgeon: Everitt Amber, MD;  Location: [facility];  Service: Gynecology;  Laterality: Bilateral;  . ROTATOR CUFF REPAIR Left   . THYROID SURGERY    . THYROIDECTOMY  2000s    Current Medications: Current Meds  Medication Sig  . acetaminophen (TYLENOL) 500 MG tablet Take 500 mg by  mouth daily as needed for moderate pain or headache.  Marland Kitchen aspirin 81 MG tablet Take 81 mg by mouth at bedtime.   Marland Kitchen atorvastatin (LIPITOR) 80 MG tablet Take 80 mg by mouth at bedtime.   . Cholecalciferol (VITAMIN D3) 400 units CAPS Take 400 Units by mouth daily.  Marland Kitchen ezetimibe (ZETIA) 10 MG tablet Take 10 mg by mouth daily.   . Fiber POWD Take 1 Scoop by mouth daily.  . furosemide (LASIX) 40 MG tablet Take 1 tablet (40 mg total) by mouth daily.  Marland Kitchen levothyroxine (SYNTHROID, LEVOTHROID) 88 MCG tablet Take 88 mcg by mouth daily before breakfast.   . lisinopril (PRINIVIL,ZESTRIL) 10 MG tablet Take 1 tablet by mouth daily.  . metoprolol succinate (TOPROL-XL) 50 MG 24 hr tablet Take 50 mg by mouth daily.  . Multiple Vitamin (MULTIVITAMIN WITH MINERALS) TABS tablet Take 1 tablet by mouth daily.  . nitroGLYCERIN (NITROSTAT) 0.4 MG SL tablet Place 1 tablet (0.4 mg total) under the tongue every 5 (five) minutes as needed for chest pain.  Marland Kitchen omeprazole (PRILOSEC) 20 MG capsule Take 20 mg by mouth daily.  Marland Kitchen PREMARIN vaginal cream      Allergies:   Patient has no known allergies.   Social History   Socioeconomic History  . Marital status: Married    Spouse name: Not on file  . Number of children: Not on file  . Years of education: Not on file  . Highest education level: Not on file  Occupational History  . Not on file  Tobacco Use  . Smoking status: Never Smoker  . Smokeless tobacco: Never Used  Vaping Use  . Vaping Use: Never used  Substance and Sexual Activity  . Alcohol use: No  . Drug use: No  . Sexual activity: Not on file  Other Topics Concern  . Not on file  Social History Narrative   Lives alone,retired.   Husband deceased   Social Determinants of Health   Financial Resource Strain:   . Difficulty of Paying Living Expenses: Not on file  Food Insecurity:   . Worried About Charity fundraiser in the Last Year: Not on file  . Ran Out of Food in the Last Year: Not on file    Transportation Needs:   . Lack of Transportation (Medical): Not on file  . Lack of Transportation (Non-Medical): Not on file  Physical Activity:   . Days of Exercise per Week: Not on file  . Minutes of Exercise per Session: Not on file  Stress:   . Feeling of Stress : Not on file  Social Connections:   . Frequency of Communication with Friends and Family: Not on file  . Frequency of Social Gatherings with Friends and Family: Not on file  . Attends Religious Services: Not on file  . Active Member of Clubs or Organizations: Not on file  . Attends Archivist Meetings: Not on file  . Marital Status:  Not on file     Family History: The patient's family history includes Depression in her father; Heart failure in her mother; Kidney cancer in her brother; Leukemia in her brother.  ROS:   Please see the history of present illness.    All other systems reviewed and are negative.  EKGs/Labs/Other Studies Reviewed:    The following studies were reviewed today: IMPRESSIONS    1. Left ventricular ejection fraction, by estimation, is 60 to 65%. The  left ventricle has normal function. The left ventricle has no regional  wall motion abnormalities. There is moderate concentric left ventricular  hypertrophy. Left ventricular  diastolic parameters are consistent with Grade I diastolic dysfunction  (impaired relaxation).  2. Right ventricular systolic function is normal. The right ventricular  size is normal. There is normal pulmonary artery systolic pressure.  3. The mitral valve is normal in structure. No evidence of mitral valve  regurgitation. No evidence of mitral stenosis.  4. The aortic valve is tricuspid. Aortic valve regurgitation is mild to  moderate. Mild aortic valve stenosis.  5. The inferior vena cava is normal in size with greater than 50%  respiratory variability, suggesting right atrial pressure of 3 mmHg.   Study Highlights   The left ventricular  ejection fraction is hyperdynamic (>65%).  Nuclear stress EF: 66%.  The study is normal.  This is a low risk study.    Recent Labs: 10/18/2019: BUN 16; Creatinine, Ser 0.99; Potassium 3.9; Sodium 139  Recent Lipid Panel    Component Value Date/Time   CHOL 136 03/11/2018 0808   TRIG 157 (H) 03/11/2018 0808   HDL 36 (L) 03/11/2018 0808   CHOLHDL 3.8 03/11/2018 0808   LDLCALC 69 03/11/2018 0808    Physical Exam:    VS:  BP (!) 156/68   Pulse 84   Ht 5\' 2"  (1.575 m)   Wt 138 lb 12.8 oz (63 kg)   SpO2 97%   BMI 25.39 kg/m     Wt Readings from Last 3 Encounters:  11/15/19 138 lb 12.8 oz (63 kg)  11/01/19 142 lb (64.4 kg)  10/18/19 142 lb 6.4 oz (64.6 kg)     GEN: Patient is in no acute distress  HEENT: Normal NECK: No JVD; No carotid bruits LYMPHATICS: No lymphadenopathy CARDIAC: Hear sounds regular, 2/6 systolic murmur at the apex. RESPIRATORY:  Clear to auscultation without rales, wheezing or rhonchi  ABDOMEN: Soft, non-tender, non-distended MUSCULOSKELETAL:  No edema; No deformity  SKIN: Warm and dry NEUROLOGIC:  Alert and oriented x 3 PSYCHIATRIC:  Normal affect   Signed, Brittany Lindau, MD  11/15/2019 11:40 AM    [place]

## 2019-11-16 ENCOUNTER — Other Ambulatory Visit: Payer: Self-pay

## 2019-11-16 DIAGNOSIS — N289 Disorder of kidney and ureter, unspecified: Secondary | ICD-10-CM

## 2019-11-16 NOTE — Addendum Note (Signed)
Addended by: Truddie Hidden on: 11/16/2019 08:29 AM   Modules accepted: Orders

## 2019-11-17 DIAGNOSIS — J302 Other seasonal allergic rhinitis: Secondary | ICD-10-CM | POA: Insufficient documentation

## 2019-11-17 DIAGNOSIS — E785 Hyperlipidemia, unspecified: Secondary | ICD-10-CM | POA: Insufficient documentation

## 2019-11-17 DIAGNOSIS — E039 Hypothyroidism, unspecified: Secondary | ICD-10-CM | POA: Insufficient documentation

## 2019-11-17 DIAGNOSIS — R32 Unspecified urinary incontinence: Secondary | ICD-10-CM | POA: Diagnosis not present

## 2019-11-17 DIAGNOSIS — N952 Postmenopausal atrophic vaginitis: Secondary | ICD-10-CM | POA: Diagnosis not present

## 2019-11-17 DIAGNOSIS — N8110 Cystocele, unspecified: Secondary | ICD-10-CM | POA: Diagnosis not present

## 2019-11-17 DIAGNOSIS — N819 Female genital prolapse, unspecified: Secondary | ICD-10-CM | POA: Diagnosis not present

## 2019-11-17 DIAGNOSIS — I10 Essential (primary) hypertension: Secondary | ICD-10-CM | POA: Insufficient documentation

## 2019-11-21 ENCOUNTER — Encounter: Payer: Self-pay | Admitting: Podiatry

## 2019-11-21 ENCOUNTER — Ambulatory Visit (INDEPENDENT_AMBULATORY_CARE_PROVIDER_SITE_OTHER): Payer: Medicare Other | Admitting: Podiatry

## 2019-11-21 ENCOUNTER — Other Ambulatory Visit: Payer: Self-pay

## 2019-11-21 DIAGNOSIS — M2042 Other hammer toe(s) (acquired), left foot: Secondary | ICD-10-CM | POA: Diagnosis not present

## 2019-11-21 DIAGNOSIS — M6240 Contracture of muscle, unspecified site: Secondary | ICD-10-CM | POA: Diagnosis not present

## 2019-11-21 NOTE — Progress Notes (Signed)
  Subjective:  Patient ID: TISH BEGIN, female    DOB: February 01, 1931,  MRN: [id number]  No chief complaint on file.   86 y.o. female presents for concern of left 2nd toe corn. States it has bothered her for years and has tried to shave it down herself and made it bleed.  Objective:  Physical Exam: warm, good capillary refill, no trophic changes or ulcerative lesions, normal DP and PT pulses and normal sensory exam. Left Foot: hammertoe deformities with 2nd toe medial PIPJ corn Right Foot: hammertoe deformities without HPKs   Assessment:   1. Hammertoe of left foot   2. Contracture of tendon sheath      Plan:  Patient was evaluated and treated and all questions answered.  Hammertoe -XR reviewed with patient -Educated on etiology of deformity -Discussed padding and shoe gear changes -Dispensed toe spacers  -Lesion pared, courtesy  No follow-ups on file.

## 2019-11-23 DIAGNOSIS — E785 Hyperlipidemia, unspecified: Secondary | ICD-10-CM | POA: Diagnosis not present

## 2019-11-23 DIAGNOSIS — Z23 Encounter for immunization: Secondary | ICD-10-CM | POA: Diagnosis not present

## 2019-11-23 DIAGNOSIS — I10 Essential (primary) hypertension: Secondary | ICD-10-CM | POA: Diagnosis not present

## 2019-11-23 DIAGNOSIS — Z6827 Body mass index (BMI) 27.0-27.9, adult: Secondary | ICD-10-CM | POA: Diagnosis not present

## 2019-11-23 DIAGNOSIS — E890 Postprocedural hypothyroidism: Secondary | ICD-10-CM | POA: Diagnosis not present

## 2019-11-23 DIAGNOSIS — E559 Vitamin D deficiency, unspecified: Secondary | ICD-10-CM | POA: Diagnosis not present

## 2019-11-23 DIAGNOSIS — I2510 Atherosclerotic heart disease of native coronary artery without angina pectoris: Secondary | ICD-10-CM | POA: Diagnosis not present

## 2019-12-06 DIAGNOSIS — Z1231 Encounter for screening mammogram for malignant neoplasm of breast: Secondary | ICD-10-CM | POA: Diagnosis not present

## 2019-12-08 DIAGNOSIS — Z961 Presence of intraocular lens: Secondary | ICD-10-CM | POA: Diagnosis not present

## 2019-12-08 DIAGNOSIS — H353131 Nonexudative age-related macular degeneration, bilateral, early dry stage: Secondary | ICD-10-CM | POA: Diagnosis not present

## 2019-12-08 DIAGNOSIS — H5213 Myopia, bilateral: Secondary | ICD-10-CM | POA: Diagnosis not present

## 2019-12-08 DIAGNOSIS — H524 Presbyopia: Secondary | ICD-10-CM | POA: Diagnosis not present

## 2019-12-08 DIAGNOSIS — H04123 Dry eye syndrome of bilateral lacrimal glands: Secondary | ICD-10-CM | POA: Diagnosis not present

## 2020-01-10 DIAGNOSIS — J208 Acute bronchitis due to other specified organisms: Secondary | ICD-10-CM | POA: Diagnosis not present

## 2020-01-10 DIAGNOSIS — B9689 Other specified bacterial agents as the cause of diseases classified elsewhere: Secondary | ICD-10-CM | POA: Diagnosis not present

## 2020-02-10 ENCOUNTER — Ambulatory Visit (INDEPENDENT_AMBULATORY_CARE_PROVIDER_SITE_OTHER): Payer: Medicare Other | Admitting: Cardiology

## 2020-02-10 ENCOUNTER — Other Ambulatory Visit: Payer: Self-pay

## 2020-02-10 ENCOUNTER — Encounter: Payer: Self-pay | Admitting: Cardiology

## 2020-02-10 VITALS — BP 144/62 | HR 68 | Ht 61.0 in | Wt 135.4 lb

## 2020-02-10 DIAGNOSIS — I10 Essential (primary) hypertension: Secondary | ICD-10-CM

## 2020-02-10 DIAGNOSIS — I25118 Atherosclerotic heart disease of native coronary artery with other forms of angina pectoris: Secondary | ICD-10-CM | POA: Diagnosis not present

## 2020-02-10 DIAGNOSIS — Z951 Presence of aortocoronary bypass graft: Secondary | ICD-10-CM

## 2020-02-10 DIAGNOSIS — I350 Nonrheumatic aortic (valve) stenosis: Secondary | ICD-10-CM

## 2020-02-10 DIAGNOSIS — E782 Mixed hyperlipidemia: Secondary | ICD-10-CM

## 2020-02-10 NOTE — Progress Notes (Signed)
Cardiology Office Note:    Date:  02/10/2020   ID:  Brittany Santiago, DOB Feb 29, 1930, MRN [id number]  PCP:  Nicoletta Dress, MD  Cardiologist:  Jenean Lindau, MD   Referring MD: Nicoletta Dress, MD    ASSESSMENT:    1. Coronary artery disease of native artery of native heart with stable angina pectoris ([place])   2. Primary hypertension   3. Mixed hyperlipidemia   4. Mild aortic stenosis   5. S/P CABG x 2    PLAN:    In order of problems listed above:  1. Coronary artery disease: Secondary prevention stressed with patient. Importance of compliance with diet medication stressed and she vocalized understanding. 2. Essential hypertension: Blood pressure stable and diet was emphasized. 3. Mixed dyslipidemia: Lipids were reviewed. This is followed by primary care physician. They're stable. She is on statin therapy. 4. Mild aortic stenosis: Stable and medical management. Stress test and echo report were detailed above.Patient will be seen in follow-up appointment in 6 months or earlier if the patient has any concerns    Medication Adjustments/Labs and Tests Ordered: Current medicines are reviewed at length with the patient today.  Concerns regarding medicines are outlined above.  No orders of the defined types were placed in this encounter.  No orders of the defined types were placed in this encounter.    No chief complaint on file.    History of Present Illness:    Brittany Santiago is a 86 y.o. female. Patient has past medical history of coronary artery disease post CABG surgery, essential hypertension mixed dyslipidemia and diabetes mellitus. She denies any problems at this time and takes care of activities of daily living. No chest pain orthopnea or PND. She complains of poor p.o. occasionally. At the time of my evaluation, the patient is alert awake oriented and in no distress.  Past Medical History:  Diagnosis Date  . Asthma, moderate persistent 08/02/2013    6/9/2015[facility]: normal CXR normal  ; Brittany Santiago , STATES I HAD BRONCHITIS BEFORE AND USED AN INHALER TEMPORARILY   . Atherosclerosis of coronary artery 03/16/2015  . Cancer [facility])    endometrial   . Coronary artery disease involving native coronary artery of native heart without angina pectoris 03/16/2015  . Coronary artery disease of native artery of native heart with stable angina pectoris ([place]) 03/16/2015  . Coronary atherosclerosis of native coronary artery 03/16/2015  . Dyslipidemia 03/10/2017  . Endometrial cancer ([place]) 05/27/2017  . 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 Female bladder prolapse   . Hyperlipidemia   . Hypertension   . Hypothyroidism   . Left bundle branch block (LBBB) 03/04/2017  . Menopausal symptom 05/29/2017  . Mild aortic regurgitation 11/15/2019  . Mild aortic stenosis 11/15/2019  . Pre-operative cardiovascular examination 05/29/2017  . 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 Seasonal allergies   . Thyroid disease   . 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 Added automatically from request for surgery [id number]    Past Surgical History:  Procedure Laterality Date  . ABDOMINAL HYSTERECTOMY  2019  . APPENDECTOMY     yaken with first c section   . CESAREAN SECTION     x2   . CORONARY ARTERY BYPASS GRAFT  02/2015   DOUBLE BYPASS ; DONE AT  [place]    . EYE SURGERY  1999   bilateral cataract extraction   . JOINT REPLACEMENT Right    hip  .  KNEE ARTHROSCOPY Left   . neck back surgery  2009  . ROBOTIC ASSISTED TOTAL HYSTERECTOMY WITH BILATERAL SALPINGO OOPHERECTOMY Bilateral 06/15/2017   Procedure: XI ROBOTIC ASSISTED TOTAL HYSTERECTOMY WITH BILATERAL SALPINGO OOPHORECTOMY WITH SENTINAL LYMPH NODES, PELVIC LYMPHADENECTOMY, AND LYSIS OF ADHESIONS;  Surgeon: Everitt Amber, MD;  Location: [facility];  Service: Gynecology;  Laterality: Bilateral;  . ROTATOR CUFF REPAIR Left   . THYROID SURGERY    . THYROIDECTOMY  2000s    Current  Medications: Current Meds  Medication Sig  . acetaminophen (TYLENOL) 500 MG tablet Take 500 mg by mouth daily as needed for moderate pain or headache.  . ALPRAZolam (XANAX) 0.25 MG tablet Take 0.25 mg by mouth 3 (three) times daily as needed.  Marland Kitchen aspirin 81 MG tablet Take 81 mg by mouth at bedtime.   Marland Kitchen atorvastatin (LIPITOR) 80 MG tablet Take 80 mg by mouth at bedtime.   . Cholecalciferol (VITAMIN D3) 400 units CAPS Take 400 Units by mouth daily.  Marland Kitchen ezetimibe (ZETIA) 10 MG tablet Take 10 mg by mouth daily.   . Fiber POWD Take 1 Scoop by mouth daily.  Marland Kitchen levothyroxine (SYNTHROID, LEVOTHROID) 88 MCG tablet Take 88 mcg by mouth daily before breakfast.   . lisinopril (PRINIVIL,ZESTRIL) 10 MG tablet Take 1 tablet by mouth daily.  . metoprolol succinate (TOPROL-XL) 50 MG 24 hr tablet Take 50 mg by mouth daily.  . Multiple Vitamin (MULTIVITAMIN WITH MINERALS) TABS tablet Take 1 tablet by mouth daily.  Marland Kitchen omeprazole (PRILOSEC) 20 MG capsule Take 20 mg by mouth daily.  Marland Kitchen PREMARIN vaginal cream      Allergies:   Patient has no known allergies.   Social History   Socioeconomic History  . Marital status: Married    Spouse name: Not on file  . Number of children: Not on file  . Years of education: Not on file  . Highest education level: Not on file  Occupational History  . Not on file  Tobacco Use  . Smoking status: Never Smoker  . Smokeless tobacco: Never Used  Vaping Use  . Vaping Use: Never used  Substance and Sexual Activity  . Alcohol use: No  . Drug use: No  . Sexual activity: Not on file  Other Topics Concern  . Not on file  Social History Narrative   Lives alone,retired.   Husband deceased   Social Determinants of Health   Financial Resource Strain: Not on file  Food Insecurity: Not on file  Transportation Needs: Not on file  Physical Activity: Not on file  Stress: Not on file  Social Connections: Not on file     Family History: The patient's family history includes  Depression in her father; Heart failure in her mother; Kidney cancer in her brother; Leukemia in her brother.  ROS:   Please see the history of present illness.    All other systems reviewed and are negative.  EKGs/Labs/Other Studies Reviewed:    The following studies were reviewed today: I discussed stress test and echocardiogram report which were largely unremarkable. Echo revealed only mild aortic stenosis   Recent Labs: 11/15/2019: BUN 17; Creatinine, Ser 1.30; Potassium 3.7; Sodium 138  Recent Lipid Panel    Component Value Date/Time   CHOL 136 03/11/2018 0808   TRIG 157 (H) 03/11/2018 0808   HDL 36 (L) 03/11/2018 0808   CHOLHDL 3.8 03/11/2018 0808   LDLCALC 69 03/11/2018 0808    Physical Exam:    VS:  BP (!) 144/62  Pulse 68   Ht 5\' 1"  (1.549 m)   Wt 135 lb 6.4 oz (61.4 kg)   SpO2 97%   BMI 25.58 kg/m     Wt Readings from Last 3 Encounters:  02/10/20 135 lb 6.4 oz (61.4 kg)  11/15/19 138 lb 12.8 oz (63 kg)  11/01/19 142 lb (64.4 kg)     GEN: Patient is in no acute distress HEENT: Normal NECK: No JVD; No carotid bruits LYMPHATICS: No lymphadenopathy CARDIAC: Hear sounds regular, 2/6 systolic murmur at the apex. RESPIRATORY:  Clear to auscultation without rales, wheezing or rhonchi  ABDOMEN: Soft, non-tender, non-distended MUSCULOSKELETAL:  No edema; No deformity  SKIN: Warm and dry NEUROLOGIC:  Alert and oriented x 3 PSYCHIATRIC:  Normal affect   Signed, Jenean Lindau, MD  02/10/2020 2:03 PM    [place]

## 2020-02-10 NOTE — Patient Instructions (Signed)

## 2020-02-28 ENCOUNTER — Ambulatory Visit: Payer: Medicare Other | Admitting: Podiatry

## 2020-03-01 ENCOUNTER — Ambulatory Visit: Payer: Medicare Other | Admitting: Podiatry

## 2020-03-22 ENCOUNTER — Ambulatory Visit (INDEPENDENT_AMBULATORY_CARE_PROVIDER_SITE_OTHER): Payer: Medicare Other | Admitting: Podiatry

## 2020-03-22 DIAGNOSIS — Z5329 Procedure and treatment not carried out because of patient's decision for other reasons: Secondary | ICD-10-CM

## 2020-03-22 NOTE — Progress Notes (Signed)
No show for appt. 

## 2020-03-26 ENCOUNTER — Ambulatory Visit (INDEPENDENT_AMBULATORY_CARE_PROVIDER_SITE_OTHER): Payer: Medicare Other | Admitting: Podiatry

## 2020-03-26 ENCOUNTER — Other Ambulatory Visit: Payer: Self-pay

## 2020-03-26 ENCOUNTER — Encounter: Payer: Self-pay | Admitting: Podiatry

## 2020-03-26 DIAGNOSIS — M6240 Contracture of muscle, unspecified site: Secondary | ICD-10-CM | POA: Diagnosis not present

## 2020-03-26 DIAGNOSIS — G5761 Lesion of plantar nerve, right lower limb: Secondary | ICD-10-CM

## 2020-03-26 DIAGNOSIS — M2042 Other hammer toe(s) (acquired), left foot: Secondary | ICD-10-CM

## 2020-03-26 NOTE — Progress Notes (Signed)
  Subjective:  Patient ID: Brittany Santiago, female    DOB: Nov 03, 1930,  MRN: [id number]  Chief Complaint  Patient presents with  . Callouses    I have some corns in between my 4th and 5th toes right and the big toe and 2nd toe left     87 y.o. female presents for f/u of corns both feet. Also has new right foot pain that aches at night. Present for several weeks. Denies other issues.  Objective:  Physical Exam: warm, good capillary refill, no trophic changes or ulcerative lesions, normal DP and PT pulses and normal sensory exam. Left Foot: hammertoe deformities with 2nd toe medial PIPJ corn Right Foot: hammertoe deformities with mild 4th toe lateral HPKs, POP 3rd interspace.   Assessment:   1. Hammertoe of left foot   2. Contracture of tendon sheath   3. Morton neuroma, right     Plan:  Patient was evaluated and treated and all questions answered.  Hammertoe -Dispensed toe spacers  -Pared lesions, courtesy. Right 4th toe corn  Neuroma -Pad applied to patient's R foot insert.  -Lesion pared, courtesy  No follow-ups on file.

## 2020-05-22 DIAGNOSIS — I10 Essential (primary) hypertension: Secondary | ICD-10-CM | POA: Diagnosis not present

## 2020-05-22 DIAGNOSIS — I2510 Atherosclerotic heart disease of native coronary artery without angina pectoris: Secondary | ICD-10-CM | POA: Diagnosis not present

## 2020-05-22 DIAGNOSIS — E890 Postprocedural hypothyroidism: Secondary | ICD-10-CM | POA: Diagnosis not present

## 2020-05-22 DIAGNOSIS — E785 Hyperlipidemia, unspecified: Secondary | ICD-10-CM | POA: Diagnosis not present

## 2020-05-22 DIAGNOSIS — E559 Vitamin D deficiency, unspecified: Secondary | ICD-10-CM | POA: Diagnosis not present

## 2020-06-22 DIAGNOSIS — K469 Unspecified abdominal hernia without obstruction or gangrene: Secondary | ICD-10-CM | POA: Diagnosis not present

## 2020-06-22 DIAGNOSIS — N949 Unspecified condition associated with female genital organs and menstrual cycle: Secondary | ICD-10-CM | POA: Diagnosis not present

## 2020-06-25 DIAGNOSIS — E890 Postprocedural hypothyroidism: Secondary | ICD-10-CM | POA: Diagnosis not present

## 2020-07-14 DIAGNOSIS — M25551 Pain in right hip: Secondary | ICD-10-CM | POA: Diagnosis not present

## 2020-07-16 DIAGNOSIS — M25551 Pain in right hip: Secondary | ICD-10-CM | POA: Diagnosis not present

## 2020-07-20 DIAGNOSIS — Z96649 Presence of unspecified artificial hip joint: Secondary | ICD-10-CM | POA: Diagnosis not present

## 2020-07-20 DIAGNOSIS — M5431 Sciatica, right side: Secondary | ICD-10-CM | POA: Diagnosis not present

## 2020-07-20 DIAGNOSIS — M47817 Spondylosis without myelopathy or radiculopathy, lumbosacral region: Secondary | ICD-10-CM | POA: Diagnosis not present

## 2020-07-24 DIAGNOSIS — R945 Abnormal results of liver function studies: Secondary | ICD-10-CM | POA: Diagnosis not present

## 2020-07-24 DIAGNOSIS — R7989 Other specified abnormal findings of blood chemistry: Secondary | ICD-10-CM | POA: Diagnosis not present

## 2020-07-24 DIAGNOSIS — E890 Postprocedural hypothyroidism: Secondary | ICD-10-CM | POA: Diagnosis not present

## 2020-07-25 DIAGNOSIS — M545 Low back pain, unspecified: Secondary | ICD-10-CM | POA: Diagnosis not present

## 2020-07-25 DIAGNOSIS — M62551 Muscle wasting and atrophy, not elsewhere classified, right thigh: Secondary | ICD-10-CM | POA: Diagnosis not present

## 2020-07-25 DIAGNOSIS — M62552 Muscle wasting and atrophy, not elsewhere classified, left thigh: Secondary | ICD-10-CM | POA: Diagnosis not present

## 2020-07-27 DIAGNOSIS — M62551 Muscle wasting and atrophy, not elsewhere classified, right thigh: Secondary | ICD-10-CM | POA: Diagnosis not present

## 2020-07-27 DIAGNOSIS — M62552 Muscle wasting and atrophy, not elsewhere classified, left thigh: Secondary | ICD-10-CM | POA: Diagnosis not present

## 2020-07-27 DIAGNOSIS — M545 Low back pain, unspecified: Secondary | ICD-10-CM | POA: Diagnosis not present

## 2020-07-30 DIAGNOSIS — M62551 Muscle wasting and atrophy, not elsewhere classified, right thigh: Secondary | ICD-10-CM | POA: Diagnosis not present

## 2020-07-30 DIAGNOSIS — M62552 Muscle wasting and atrophy, not elsewhere classified, left thigh: Secondary | ICD-10-CM | POA: Diagnosis not present

## 2020-07-30 DIAGNOSIS — M545 Low back pain, unspecified: Secondary | ICD-10-CM | POA: Diagnosis not present

## 2020-08-01 DIAGNOSIS — M62552 Muscle wasting and atrophy, not elsewhere classified, left thigh: Secondary | ICD-10-CM | POA: Diagnosis not present

## 2020-08-01 DIAGNOSIS — M62551 Muscle wasting and atrophy, not elsewhere classified, right thigh: Secondary | ICD-10-CM | POA: Diagnosis not present

## 2020-08-01 DIAGNOSIS — M545 Low back pain, unspecified: Secondary | ICD-10-CM | POA: Diagnosis not present

## 2020-08-07 DIAGNOSIS — M62552 Muscle wasting and atrophy, not elsewhere classified, left thigh: Secondary | ICD-10-CM | POA: Diagnosis not present

## 2020-08-07 DIAGNOSIS — R350 Frequency of micturition: Secondary | ICD-10-CM | POA: Diagnosis not present

## 2020-08-07 DIAGNOSIS — M545 Low back pain, unspecified: Secondary | ICD-10-CM | POA: Diagnosis not present

## 2020-08-07 DIAGNOSIS — Z6826 Body mass index (BMI) 26.0-26.9, adult: Secondary | ICD-10-CM | POA: Diagnosis not present

## 2020-08-07 DIAGNOSIS — M62551 Muscle wasting and atrophy, not elsewhere classified, right thigh: Secondary | ICD-10-CM | POA: Diagnosis not present

## 2020-08-09 DIAGNOSIS — M545 Low back pain, unspecified: Secondary | ICD-10-CM | POA: Diagnosis not present

## 2020-08-09 DIAGNOSIS — M62552 Muscle wasting and atrophy, not elsewhere classified, left thigh: Secondary | ICD-10-CM | POA: Diagnosis not present

## 2020-08-09 DIAGNOSIS — M62551 Muscle wasting and atrophy, not elsewhere classified, right thigh: Secondary | ICD-10-CM | POA: Diagnosis not present

## 2020-08-10 ENCOUNTER — Ambulatory Visit (INDEPENDENT_AMBULATORY_CARE_PROVIDER_SITE_OTHER): Payer: Medicare Other | Admitting: Cardiology

## 2020-08-10 ENCOUNTER — Encounter: Payer: Self-pay | Admitting: Cardiology

## 2020-08-10 ENCOUNTER — Other Ambulatory Visit: Payer: Self-pay

## 2020-08-10 VITALS — BP 126/60 | HR 68 | Ht 60.0 in | Wt 133.6 lb

## 2020-08-10 DIAGNOSIS — I2510 Atherosclerotic heart disease of native coronary artery without angina pectoris: Secondary | ICD-10-CM | POA: Diagnosis not present

## 2020-08-10 DIAGNOSIS — E782 Mixed hyperlipidemia: Secondary | ICD-10-CM

## 2020-08-10 DIAGNOSIS — I350 Nonrheumatic aortic (valve) stenosis: Secondary | ICD-10-CM | POA: Diagnosis not present

## 2020-08-10 DIAGNOSIS — I10 Essential (primary) hypertension: Secondary | ICD-10-CM

## 2020-08-10 DIAGNOSIS — I351 Nonrheumatic aortic (valve) insufficiency: Secondary | ICD-10-CM

## 2020-08-10 DIAGNOSIS — Z951 Presence of aortocoronary bypass graft: Secondary | ICD-10-CM

## 2020-08-10 NOTE — Progress Notes (Signed)
Cardiology Office Note:    Date:  08/10/2020   ID:  Brittany Santiago, DOB 19-Jun-1930, MRN [id number]  PCP:  Nicoletta Dress, MD  Cardiologist:  Jenean Lindau, MD   Referring MD: Nicoletta Dress, MD    ASSESSMENT:    1. Coronary artery disease involving native coronary artery of native heart without angina pectoris   2. Mixed hyperlipidemia   3. Primary hypertension   4. Mild aortic stenosis   5. S/P CABG x 2   6. Mild aortic regurgitation    PLAN:    In order of problems listed above:  Coronary artery disease: Secondary prevention stressed with the patient.  Importance of compliance with diet medication stressed and she vocalized understanding. Essential hypertension: Blood pressure stable and diet was emphasized.  Lifestyle modification urged. Mixed dyslipidemia: Lipids were reviewed from [facility] sheet and they are acceptable.  I discussed this with the patient at length. Mild aortic stenosis and regurgitation: Stable and we will continue to monitor. Patient will be seen in follow-up appointment in 6 months or earlier if the patient has any concerns    Medication Adjustments/Labs and Tests Ordered: Current medicines are reviewed at length with the patient today.  Concerns regarding medicines are outlined above.  Orders Placed This Encounter  Procedures   EKG 12-Lead   No orders of the defined types were placed in this encounter.    No chief complaint on file.    History of Present Illness:    Brittany Santiago is a 87 y.o. female.  Patient has past medical history of coronary artery disease post CABG surgery, essential hypertension and dyslipidemia.  She denies any problems at this time and takes care of activities of daily living.  No chest pain orthopnea or PND.  She does not walk on a regular basis because she has orthopedic issues involving her back.  At the time of my evaluation, the patient is alert awake oriented and in no distress.  Past Medical History:   Diagnosis Date   Asthma, moderate persistent 08/02/2013   6/9/2015[facility]: normal CXR normal  ; DENIS , STATES I HAD BRONCHITIS BEFORE AND USED AN INHALER TEMPORARILY    Atherosclerosis of coronary artery 03/16/2015   Cancer ([place])    endometrial    Coronary artery disease involving native coronary artery of native heart without angina pectoris 03/16/2015   Coronary artery disease of native artery of native heart with stable angina pectoris ([place]) 03/16/2015   Coronary atherosclerosis of native coronary artery 03/16/2015   Dyslipidemia 03/10/2017   Endometrial cancer ([place]) 05/27/2017   Enterocele 12/30/2017   Female bladder prolapse    Hyperlipidemia    Hypertension    Hypothyroidism    Left bundle branch block (LBBB) 03/04/2017   Menopausal symptom 05/29/2017   Mild aortic regurgitation 11/15/2019   Mild aortic stenosis 11/15/2019   Pre-operative cardiovascular examination 05/29/2017   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 Added automatically from request for surgery [id number]    Past Surgical History:  Procedure Laterality Date   ABDOMINAL HYSTERECTOMY  2019   APPENDECTOMY     yaken with first c section    CESAREAN SECTION     x2    CORONARY ARTERY BYPASS GRAFT  02/2015   DOUBLE BYPASS ; DONE AT  [place]  EYE SURGERY  1999   bilateral cataract extraction    JOINT REPLACEMENT Right    hip   KNEE ARTHROSCOPY Left    neck back surgery  2009   ROBOTIC ASSISTED TOTAL HYSTERECTOMY WITH BILATERAL SALPINGO OOPHERECTOMY Bilateral 06/15/2017   Procedure: XI ROBOTIC ASSISTED TOTAL HYSTERECTOMY WITH BILATERAL SALPINGO OOPHORECTOMY WITH SENTINAL LYMPH NODES, PELVIC LYMPHADENECTOMY, AND LYSIS OF ADHESIONS;  Surgeon: Everitt Amber, MD;  Location: [facility];  Service: Gynecology;  Laterality: Bilateral;   ROTATOR CUFF REPAIR Left    THYROID SURGERY     THYROIDECTOMY  2000s     Current Medications: Current Meds  Medication Sig   acetaminophen (TYLENOL) 500 MG tablet Take 500 mg by mouth daily as needed for moderate pain or headache.   ALPRAZolam (XANAX) 0.25 MG tablet Take 0.25 mg by mouth 3 (three) times daily as needed for anxiety.   aspirin 81 MG tablet Take 81 mg by mouth at bedtime.    atorvastatin (LIPITOR) 80 MG tablet Take 80 mg by mouth at bedtime.    Cholecalciferol (VITAMIN D3) 400 units CAPS Take 400 Units by mouth daily.   ciprofloxacin (CIPRO) 250 MG tablet Take 250 mg by mouth 2 (two) times daily.   ezetimibe (ZETIA) 10 MG tablet Take 10 mg by mouth daily.    Fiber POWD Take 1 Scoop by mouth daily.   furosemide (LASIX) 40 MG tablet Take 40 mg by mouth daily.   levothyroxine (SYNTHROID) 50 MCG tablet Take 50 mcg by mouth daily.   lisinopril (PRINIVIL,ZESTRIL) 10 MG tablet Take 1 tablet by mouth daily.   meloxicam (MOBIC) 15 MG tablet Take 1 tablet by mouth daily.   metoprolol succinate (TOPROL-XL) 50 MG 24 hr tablet Take 50 mg by mouth daily.   Multiple Vitamin (MULTIVITAMIN WITH MINERALS) TABS tablet Take 1 tablet by mouth daily.   nitroGLYCERIN (NITROSTAT) 0.4 MG SL tablet Place 0.4 mg under the tongue every 5 (five) minutes as needed for chest pain.   omeprazole (PRILOSEC) 20 MG capsule Take 20 mg by mouth daily.   PREMARIN vaginal cream Place 1 Applicatorful vaginally daily.     Allergies:   Patient has no known allergies.   Social History   Socioeconomic History   Marital status: Married    Spouse name: Not on file   Number of children: Not on file   Years of education: Not on file   Highest education level: Not on file  Occupational History   Not on file  Tobacco Use   Smoking status: Never   Smokeless tobacco: Never  Vaping Use   Vaping Use: Never used  Substance and Sexual Activity   Alcohol use: No   Drug use: No   Sexual activity: Not on file  Other Topics Concern   Not on file  Social History Narrative   Lives  alone,retired.   Husband deceased   Social Determinants of Health   Financial Resource Strain: Not on file  Food Insecurity: Not on file  Transportation Needs: Not on file  Physical Activity: Not on file  Stress: Not on file  Social Connections: Not on file     Family History: The patient's family history includes Depression in her father; Heart failure in her mother; Kidney cancer in her brother; Leukemia in her brother.  ROS:   Please see the history of present illness.    All other systems reviewed and are negative.  EKGs/Labs/Other Studies Reviewed:    The following studies were reviewed today: EKG  reveals sinus rhythm and incomplete bundle branch block.   Recent Labs: 11/15/2019: BUN 17; Creatinine, Ser 1.30; Potassium 3.7; Sodium 138  Recent Lipid Panel    Component Value Date/Time   CHOL 136 03/11/2018 0808   TRIG 157 (H) 03/11/2018 0808   HDL 36 (L) 03/11/2018 0808   CHOLHDL 3.8 03/11/2018 0808   LDLCALC 69 03/11/2018 0808    Physical Exam:    VS:  BP 126/60   Pulse 68   Ht 5' (1.524 m)   Wt 133 lb 9.6 oz (60.6 kg)   SpO2 96%   BMI 26.09 kg/m     Wt Readings from Last 3 Encounters:  08/10/20 133 lb 9.6 oz (60.6 kg)  02/10/20 135 lb 6.4 oz (61.4 kg)  11/15/19 138 lb 12.8 oz (63 kg)     GEN: Patient is in no acute distress HEENT: Normal NECK: No JVD; No carotid bruits LYMPHATICS: No lymphadenopathy CARDIAC: Hear sounds regular, 2/6 systolic murmur at the apex. RESPIRATORY:  Clear to auscultation without rales, wheezing or rhonchi  ABDOMEN: Soft, non-tender, non-distended MUSCULOSKELETAL:  No edema; No deformity  SKIN: Warm and dry NEUROLOGIC:  Alert and oriented x 3 PSYCHIATRIC:  Normal affect   Signed, Jenean Lindau, MD  08/10/2020 2:08 PM    [facility]

## 2020-08-10 NOTE — Patient Instructions (Signed)

## 2020-08-13 DIAGNOSIS — M62552 Muscle wasting and atrophy, not elsewhere classified, left thigh: Secondary | ICD-10-CM | POA: Diagnosis not present

## 2020-08-13 DIAGNOSIS — M62551 Muscle wasting and atrophy, not elsewhere classified, right thigh: Secondary | ICD-10-CM | POA: Diagnosis not present

## 2020-08-13 DIAGNOSIS — M545 Low back pain, unspecified: Secondary | ICD-10-CM | POA: Diagnosis not present

## 2020-08-15 DIAGNOSIS — M62552 Muscle wasting and atrophy, not elsewhere classified, left thigh: Secondary | ICD-10-CM | POA: Diagnosis not present

## 2020-08-15 DIAGNOSIS — M545 Low back pain, unspecified: Secondary | ICD-10-CM | POA: Diagnosis not present

## 2020-08-15 DIAGNOSIS — M62551 Muscle wasting and atrophy, not elsewhere classified, right thigh: Secondary | ICD-10-CM | POA: Diagnosis not present

## 2020-08-28 DIAGNOSIS — M62552 Muscle wasting and atrophy, not elsewhere classified, left thigh: Secondary | ICD-10-CM | POA: Diagnosis not present

## 2020-08-28 DIAGNOSIS — M545 Low back pain, unspecified: Secondary | ICD-10-CM | POA: Diagnosis not present

## 2020-08-28 DIAGNOSIS — M62551 Muscle wasting and atrophy, not elsewhere classified, right thigh: Secondary | ICD-10-CM | POA: Diagnosis not present

## 2020-09-07 DIAGNOSIS — M5431 Sciatica, right side: Secondary | ICD-10-CM | POA: Diagnosis not present

## 2020-09-07 DIAGNOSIS — M5136 Other intervertebral disc degeneration, lumbar region: Secondary | ICD-10-CM | POA: Diagnosis not present

## 2020-09-12 DIAGNOSIS — E785 Hyperlipidemia, unspecified: Secondary | ICD-10-CM | POA: Diagnosis not present

## 2020-09-12 DIAGNOSIS — Z0000 Encounter for general adult medical examination without abnormal findings: Secondary | ICD-10-CM | POA: Diagnosis not present

## 2020-09-12 DIAGNOSIS — Z9181 History of falling: Secondary | ICD-10-CM | POA: Diagnosis not present

## 2020-09-12 DIAGNOSIS — Z1331 Encounter for screening for depression: Secondary | ICD-10-CM | POA: Diagnosis not present

## 2020-09-12 DIAGNOSIS — Z139 Encounter for screening, unspecified: Secondary | ICD-10-CM | POA: Diagnosis not present

## 2020-10-10 DIAGNOSIS — M47817 Spondylosis without myelopathy or radiculopathy, lumbosacral region: Secondary | ICD-10-CM | POA: Diagnosis not present

## 2020-10-10 DIAGNOSIS — Z96641 Presence of right artificial hip joint: Secondary | ICD-10-CM | POA: Diagnosis not present

## 2020-10-10 DIAGNOSIS — M5431 Sciatica, right side: Secondary | ICD-10-CM | POA: Diagnosis not present

## 2020-10-19 DIAGNOSIS — M549 Dorsalgia, unspecified: Secondary | ICD-10-CM | POA: Diagnosis not present

## 2020-10-19 DIAGNOSIS — Z96649 Presence of unspecified artificial hip joint: Secondary | ICD-10-CM | POA: Diagnosis not present

## 2020-10-19 DIAGNOSIS — M7918 Myalgia, other site: Secondary | ICD-10-CM | POA: Diagnosis not present

## 2020-11-14 DIAGNOSIS — Z96641 Presence of right artificial hip joint: Secondary | ICD-10-CM | POA: Diagnosis not present

## 2020-11-14 DIAGNOSIS — M25551 Pain in right hip: Secondary | ICD-10-CM | POA: Diagnosis not present

## 2020-11-15 DIAGNOSIS — M7918 Myalgia, other site: Secondary | ICD-10-CM | POA: Diagnosis not present

## 2020-11-16 DIAGNOSIS — Z96641 Presence of right artificial hip joint: Secondary | ICD-10-CM | POA: Diagnosis not present

## 2020-11-16 DIAGNOSIS — M25551 Pain in right hip: Secondary | ICD-10-CM | POA: Diagnosis not present

## 2020-11-16 DIAGNOSIS — M7918 Myalgia, other site: Secondary | ICD-10-CM | POA: Diagnosis not present

## 2020-11-16 DIAGNOSIS — Z96649 Presence of unspecified artificial hip joint: Secondary | ICD-10-CM | POA: Diagnosis not present

## 2020-11-23 DIAGNOSIS — E890 Postprocedural hypothyroidism: Secondary | ICD-10-CM | POA: Diagnosis not present

## 2020-11-23 DIAGNOSIS — I10 Essential (primary) hypertension: Secondary | ICD-10-CM | POA: Diagnosis not present

## 2020-11-23 DIAGNOSIS — E559 Vitamin D deficiency, unspecified: Secondary | ICD-10-CM | POA: Diagnosis not present

## 2020-11-23 DIAGNOSIS — R300 Dysuria: Secondary | ICD-10-CM | POA: Diagnosis not present

## 2020-11-23 DIAGNOSIS — E785 Hyperlipidemia, unspecified: Secondary | ICD-10-CM | POA: Diagnosis not present

## 2020-11-23 DIAGNOSIS — Z23 Encounter for immunization: Secondary | ICD-10-CM | POA: Diagnosis not present

## 2020-11-23 DIAGNOSIS — I2510 Atherosclerotic heart disease of native coronary artery without angina pectoris: Secondary | ICD-10-CM | POA: Diagnosis not present

## 2020-11-27 DIAGNOSIS — M247 Protrusio acetabuli: Secondary | ICD-10-CM | POA: Diagnosis not present

## 2020-11-27 DIAGNOSIS — Z96649 Presence of unspecified artificial hip joint: Secondary | ICD-10-CM | POA: Diagnosis not present

## 2020-11-27 DIAGNOSIS — M7918 Myalgia, other site: Secondary | ICD-10-CM | POA: Diagnosis not present

## 2020-12-06 DIAGNOSIS — Z1231 Encounter for screening mammogram for malignant neoplasm of breast: Secondary | ICD-10-CM | POA: Diagnosis not present

## 2020-12-18 DIAGNOSIS — Z96641 Presence of right artificial hip joint: Secondary | ICD-10-CM | POA: Diagnosis not present

## 2020-12-25 DIAGNOSIS — Z471 Aftercare following joint replacement surgery: Secondary | ICD-10-CM | POA: Diagnosis not present

## 2020-12-25 DIAGNOSIS — Z96641 Presence of right artificial hip joint: Secondary | ICD-10-CM | POA: Diagnosis not present

## 2020-12-25 DIAGNOSIS — M25551 Pain in right hip: Secondary | ICD-10-CM | POA: Diagnosis not present

## 2020-12-26 DIAGNOSIS — M1611 Unilateral primary osteoarthritis, right hip: Secondary | ICD-10-CM | POA: Diagnosis not present

## 2020-12-26 DIAGNOSIS — E890 Postprocedural hypothyroidism: Secondary | ICD-10-CM | POA: Diagnosis not present

## 2020-12-26 DIAGNOSIS — M25551 Pain in right hip: Secondary | ICD-10-CM | POA: Diagnosis not present

## 2020-12-26 DIAGNOSIS — Z96641 Presence of right artificial hip joint: Secondary | ICD-10-CM | POA: Diagnosis not present

## 2021-01-03 DIAGNOSIS — H5213 Myopia, bilateral: Secondary | ICD-10-CM | POA: Diagnosis not present

## 2021-01-03 DIAGNOSIS — H04123 Dry eye syndrome of bilateral lacrimal glands: Secondary | ICD-10-CM | POA: Diagnosis not present

## 2021-01-03 DIAGNOSIS — H524 Presbyopia: Secondary | ICD-10-CM | POA: Diagnosis not present

## 2021-01-03 DIAGNOSIS — H353131 Nonexudative age-related macular degeneration, bilateral, early dry stage: Secondary | ICD-10-CM | POA: Diagnosis not present

## 2021-01-03 DIAGNOSIS — Z961 Presence of intraocular lens: Secondary | ICD-10-CM | POA: Diagnosis not present

## 2021-01-08 DIAGNOSIS — Z96641 Presence of right artificial hip joint: Secondary | ICD-10-CM

## 2021-01-08 DIAGNOSIS — M25551 Pain in right hip: Secondary | ICD-10-CM | POA: Diagnosis not present

## 2021-01-08 HISTORY — DX: Presence of right artificial hip joint: Z96.641

## 2021-01-14 ENCOUNTER — Telehealth: Payer: Self-pay | Admitting: Cardiology

## 2021-01-14 ENCOUNTER — Telehealth: Payer: Self-pay

## 2021-01-14 NOTE — Telephone Encounter (Signed)
New Message:  Computer shut doen before I could complete previous note.    Patient says she would like to talk to the nurse, about up coming hip surgery she is supposed to have.

## 2021-01-14 NOTE — Telephone Encounter (Signed)
Pt states she received a call from the PS regarding her upcoming surgery and she wanted to make sure it was legit. I confirmed that the call was part of [facility],

## 2021-01-14 NOTE — Telephone Encounter (Signed)
   Name: Brittany Santiago  DOB: 06-09-1930  MRN: [id number]   Primary Cardiologist: None  Chart reviewed as part of pre-operative protocol coverage. Patient was contacted 01/14/2021 in reference to pre-operative risk assessment for pending surgery as outlined below.  Brittany Santiago was last seen on 08/10/20 by Dr. Geraldo Pitter.  Since that day, Brittany Santiago has done well. She is able to get at least 4 METS of activity.   Therefore, based on ACC/AHA guidelines, the patient would be at acceptable risk for the planned procedure without further cardiovascular testing.   The patient was advised that if she develops new symptoms prior to surgery to contact our office to arrange for a follow-up visit, and she verbalized understanding.  I will route this recommendation to the requesting party via Epic fax function and remove from pre-op pool. Please call with questions.  [name], [place] 01/14/2021, 9:44 AM

## 2021-01-14 NOTE — Telephone Encounter (Signed)
New Message:     Patient says she would

## 2021-01-14 NOTE — Telephone Encounter (Signed)
   [facility] Pre-operative Risk Assessment    Patient Name: Brittany Santiago  DOB: 1930/05/27 MRN: [id number]  HEARTCARE STAFF:  - IMPORTANT!!!!!! Under Visit Info/Reason for Call, type in Other and utilize the format Clearance MM/DD/YY or Clearance TBD. Do not use dashes or single digits. - Please review there is not already an duplicate clearance open for this procedure. - If request is for dental extraction, please clarify the # of teeth to be extracted. - If the patient is currently at the dentist's office, call Pre-Op Callback Staff (MA/nurse) to input urgent request.  - If the patient is not currently in the dentist office, please route to the Pre-Op pool.  Request for surgical clearance:  What type of surgery is being performed? Conversion of previous surgery to right hip arthroplasty  When is this surgery scheduled? TBD  What type of clearance is required (medical clearance vs. Pharmacy clearance to hold med vs. Both)? medical  Are there any medications that need to be held prior to surgery and how long? none  Practice name and name of physician performing surgery? [facility] Dr. Rod Can  What is the office phone number?    7.   What is the office fax number? [phone] attn: Marianna Fuss  8.   Anesthesia type (None, local, MAC, general) ? spinal   Truddie Hidden 01/14/2021, 7:33 AM  _________________________________________________________________   (provider comments below)

## 2021-01-28 NOTE — Telephone Encounter (Signed)
Per operator the pt called our office today and said surgeon's office has not heard anything back about the clearance. I informed the operator to let the pt she was cleared on 01/14/21 and clearance notes were faxed to Dr. Lyla Glassing on 01/14/21. I will be happy to re-fax however cardiology did fax clearance notes on 01/14/21.

## 2021-01-28 NOTE — Telephone Encounter (Signed)
Patient called again to check the status of the surgical clearance. She states that Dr. Delfino Lovett has not heard anything from Dr. Geraldo Pitter yet. She would like to get the surgery asap

## 2021-01-29 NOTE — Telephone Encounter (Signed)
Patient called asking that we refax the clearance over to the office because as of yesterday they didn't have it. Please advise

## 2021-01-29 NOTE — Telephone Encounter (Signed)
   Pt is calling back, provided fax# [phone] and ATTN: Erica. She said if it can be refax today

## 2021-01-29 NOTE — Telephone Encounter (Signed)
Clearance notes re-faxed a 3rd time. Notes re-faxed today to fax# given [phone] attn: Danae Chen.

## 2021-01-29 NOTE — Telephone Encounter (Signed)
I called the pt to let her know that we have re-faxed the clearance notes again today to the new fax# given [phone] attn: Danae Chen.  Pt tells me that she did tell the operator the wrong name who it should be attn to, should be attn: Kerri. I assured the pt that I will re-fax the notes again with the attn: Kerri.  Pt thanked me for the help. Pt apologized to me for the extra work. I told her she does not need to apologize. Advised we only went by the information that was given to [place] by the surgeon's office, which is now being reflected as that we were given the wrong fax#. Now that we have the correct information, clearance has been re-faxed. Pt was very grateful for my help, time and efforts.

## 2021-01-30 ENCOUNTER — Ambulatory Visit: Payer: Self-pay | Admitting: Student

## 2021-01-30 DIAGNOSIS — Z01818 Encounter for other preprocedural examination: Secondary | ICD-10-CM

## 2021-01-30 DIAGNOSIS — M1909 Primary osteoarthritis, other specified site: Secondary | ICD-10-CM

## 2021-02-12 ENCOUNTER — Ambulatory Visit: Payer: Self-pay | Admitting: Student

## 2021-02-12 NOTE — H&P (Signed)
TOTAL HIP ADMISSION H&P  Patient is admitted for right total hip arthroplasty.  Subjective:  Chief Complaint: right hip pain  HPI: Brittany Santiago, 87 y.o. female, has a history of pain and functional disability in the right hip(s) due to arthritis and patient has failed non-surgical conservative treatments for greater than 12 weeks to include NSAID's and/or analgesics and activity modification.  Onset of symptoms was gradual starting 4 years ago with gradually worsening course since that time.The patient noted no past surgery on the right hip(s).  Patient currently rates pain in the right hip at 8 out of 10 with activity. Patient has worsening of pain with activity and weight bearing, pain that interfers with activities of daily living, and pain with passive range of motion. Patient has evidence of subchondral cysts, subchondral sclerosis, and joint space narrowing by imaging studies. This condition presents safety issues increasing the risk of falls. There is no current active infection.  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   Enterocele 12/30/2017   Menopausal symptom 05/29/2017   Pre-operative cardiovascular examination 05/29/2017   Endometrial cancer ([place]) 05/27/2017   Dyslipidemia 03/10/2017   Left bundle branch block (LBBB) 03/04/2017   Coronary artery disease of native artery of native heart with stable angina pectoris ([place] [employer]) 03/16/2015   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   Coronary atherosclerosis of native coronary artery 03/16/2015   Coronary artery disease involving native coronary artery of native heart without angina pectoris 03/16/2015   Atherosclerosis of coronary artery 03/16/2015   Asthma, moderate  persistent 08/02/2013   Thyroid disease    Past Medical History:  Diagnosis Date   Asthma, moderate persistent 08/02/2013   6/9/2015[facility]ro: normal CXR normal  ; DENIS , STATES I HAD BRONCHITIS BEFORE AND USED AN INHALER TEMPORARILY    Atherosclerosis of coronary artery 03/16/2015   Cancer ([place])    endometrial    Coronary artery disease involving native coronary artery of native heart without angina pectoris 03/16/2015   Coronary artery disease of native artery of native heart with stable angina pectoris ([place]) 03/16/2015   Coronary atherosclerosis of native coronary artery 03/16/2015   Dyslipidemia 03/10/2017   Endometrial cancer ([place]) 05/27/2017   Enterocele 12/30/2017   Female bladder prolapse    Hyperlipidemia    Hypertension    Hypothyroidism    Left bundle branch block (LBBB) 03/04/2017   Menopausal symptom 05/29/2017   Mild aortic regurgitation 11/15/2019   Mild aortic stenosis 11/15/2019   Pre-operative cardiovascular examination 05/29/2017   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 Added automatically from request for surgery [id number]    Past Surgical History:  Procedure Laterality Date   ABDOMINAL HYSTERECTOMY  2019   APPENDECTOMY     yaken with first c section    CESAREAN SECTION     x2    CORONARY ARTERY BYPASS GRAFT  02/2015   DOUBLE BYPASS ; DONE AT  [place]   bilateral cataract extraction    JOINT REPLACEMENT Right    hip  KNEE ARTHROSCOPY Left    neck back surgery  2009   ROBOTIC ASSISTED TOTAL HYSTERECTOMY WITH BILATERAL SALPINGO OOPHERECTOMY Bilateral 06/15/2017   Procedure: XI ROBOTIC ASSISTED TOTAL HYSTERECTOMY WITH BILATERAL SALPINGO OOPHORECTOMY WITH SENTINAL LYMPH NODES, PELVIC LYMPHADENECTOMY, AND LYSIS OF ADHESIONS;  Surgeon: Everitt Amber, MD;  Location: [facility];  Service: Gynecology;  Laterality: Bilateral;    ROTATOR CUFF REPAIR Left    THYROID SURGERY     THYROIDECTOMY  2000s    Current Outpatient Medications  Medication Sig Dispense Refill Last Dose   ALPRAZolam (XANAX) 0.25 MG tablet Take 0.25 mg by mouth 3 (three) times daily as needed for anxiety.      aspirin 81 MG tablet Take 81 mg by mouth at bedtime.       atorvastatin (LIPITOR) 80 MG tablet Take 40 mg by mouth at bedtime.      azelastine (OPTIVAR) 0.05 % ophthalmic solution Place 1 drop into both eyes daily as needed for dry eyes.      Calcium Carb-Cholecalciferol (CALCIUM 500 + D PO) Take 1 tablet by mouth daily.      cholecalciferol (VITAMIN D3) 25 MCG (1000 UNIT) tablet Take 1,000 Units by mouth daily.      ezetimibe (ZETIA) 10 MG tablet Take 10 mg by mouth daily.       Fiber POWD Take 1 Scoop by mouth daily.      furosemide (LASIX) 40 MG tablet Take 40 mg by mouth daily.      levothyroxine (SYNTHROID) 75 MCG tablet Take 75 mcg by mouth daily.      lisinopril (PRINIVIL,ZESTRIL) 10 MG tablet Take 1 tablet by mouth at bedtime.      metoprolol succinate (TOPROL-XL) 50 MG 24 hr tablet Take 50 mg by mouth daily.      Multiple Vitamin (MULTIVITAMIN WITH MINERALS) TABS tablet Take 1 tablet by mouth daily.      nitroGLYCERIN (NITROSTAT) 0.4 MG SL tablet Place 0.4 mg under the tongue every 5 (five) minutes as needed for chest pain.      omeprazole (PRILOSEC) 20 MG capsule Take 20 mg by mouth daily.      No current facility-administered medications for this visit.   No Known Allergies  Social History   Tobacco Use   Smoking status: Never   Smokeless tobacco: Never  Substance Use Topics   Alcohol use: No    Family History  Problem Relation Age of Onset   Heart failure Mother    Depression Father        commited suicide   Kidney cancer Brother    Leukemia Brother      Review of Systems  Musculoskeletal:  Positive for arthralgias.  All other systems reviewed and are negative.  Objective:  Physical Exam HENT:     Head:  Normocephalic.  Eyes:     Pupils: Pupils are equal, round, and reactive to light.  Cardiovascular:     Rate and Rhythm: Normal rate.  Pulmonary:     Effort: Pulmonary effort is normal.  Abdominal:     Palpations: Abdomen is soft.  Genitourinary:    Comments: Deferred Musculoskeletal:     Cervical back: Normal range of motion.     Comments: Painful ROM R hip  Skin:    General: Skin is warm.  Neurological:     Mental Status: She is Santiago and oriented to person, place, and time.  Psychiatric:        Behavior: Behavior normal.    Vital signs  in last 24 hours: @VSRANGES @  Labs:   Estimated body mass index is 26.09 kg/m as calculated from the following:   Height as of 08/10/20: 5' (1.524 m).   Weight as of 08/10/20: 60.6 kg.   Imaging Review Plain radiographs demonstrate severe degenerative joint disease of the right hip(s). The bone quality appears to be adequate for age and reported activity level.      Assessment/Plan:  End stage arthritis, right hip(s)  The patient history, physical examination, clinical judgement of the provider and imaging studies are consistent with end stage degenerative joint disease of the right hip(s) and total hip arthroplasty is deemed medically necessary. The treatment options including medical management, injection therapy, arthroscopy and arthroplasty were discussed at length. The risks and benefits of total hip arthroplasty were presented and reviewed. The risks due to aseptic loosening, infection, stiffness, dislocation/subluxation,  thromboembolic complications and other imponderables were discussed.  The patient acknowledged the explanation, agreed to proceed with the plan and consent was signed. Patient is being admitted for inpatient treatment for surgery, pain control, PT, OT, prophylactic antibiotics, VTE prophylaxis, progressive ambulation and ADL's and discharge planning.The patient is planning to be discharged  home after overnight  observation

## 2021-02-14 NOTE — Progress Notes (Addendum)
COVID swab appointment: 02/27/21 DOS   COVID Vaccine Completed: yes x3 Date COVID Vaccine completed: Has received booster: COVID vaccine manufacturer: [place]   Date of COVID positive in last 90 days: no  PCP - Nelda Bucks, MD Cardiologist - Jyl Heinz, MD  Cardiac clearance by Leanor Kail, PA 01/14/21 in Epic  Chest x-ray - n/a EKG - 08/10/20 Epic Stress Test - 11/01/19 Epic ECHO - 11/11/19 Epic Cardiac Cath - 2017 Pacemaker/ICD device last checked: n/a Spinal Cord Stimulator: n/a  Sleep Study - n/a CPAP -   Fasting Blood Sugar - n/a Checks Blood Sugar _____ times a day  Blood Thinner Instructions: ASA 81, stopping 7 days Aspirin Instructions: Last Dose:  Activity level: Can go up a flight of stairs and perform activities of daily living without stopping and without symptoms of chest pain or shortness of breath.    Anesthesia review: HTN,CAD, LBBB, aortic stenosis, asthma, CABG  Patient denies shortness of breath, fever, cough and chest pain at PAT appointment   Patient verbalized understanding of instructions that were given to them at the PAT appointment. Patient was also instructed that they will need to review over the PAT instructions again at home before surgery.

## 2021-02-14 NOTE — Patient Instructions (Addendum)
DUE TO COVID-19 ONLY ONE VISITOR IS ALLOWED TO COME WITH YOU AND STAY IN THE WAITING ROOM ONLY DURING PRE OP AND PROCEDURE.   **NO VISITORS ARE ALLOWED IN THE SHORT STAY AREA OR RECOVERY ROOM!!**  IF YOU WILL BE ADMITTED INTO THE HOSPITAL YOU ARE ALLOWED ONLY TWO SUPPORT PEOPLE DURING VISITATION HOURS ONLY (10AM -8PM)   The support person(s) may change daily. The support person(s) must pass our screening, gel in and out, and wear a mask at all times, including in the patients room. Patients must also wear a mask when staff or their support person are in the room.  No visitors under the age of 66. Any visitor under the age of 75 must be accompanied by an adult.    COVID SWAB TESTING MUST BE COMPLETED ON:  02/27/21 morning of surgery       Your procedure is scheduled on: 02/27/21   Report to [facility]    Report to admitting at 6:00 AM   Call this number if you have problems the morning of surgery [phone]   Do not eat food :After Midnight.   May have liquids until 5:30 AM day of surgery  CLEAR LIQUID DIET  Foods Allowed                                                                     Foods Excluded  Water, Black Coffee and tea (no milk or creamer)           liquids that you cannot  Plain Jell-O in any flavor  (No red)                                    see through such as: Fruit ices (not with fruit pulp)                                            milk, soups, orange juice              Iced Popsicles (No red)                                               All solid food                                   Apple juices Sports drinks like Gatorade (No red) Lightly seasoned clear broth or consume(fat free) Sugar    The day of surgery:  Drink ONE (1) Pre-Surgery Clear Ensure by 5:30 am the morning of surgery. Drink in one sitting. Do not sip.  This drink was given to you during your hospital  pre-op appointment visit. Nothing else to drink after  completing the  Pre-Surgery Clear Ensure.          If you have questions, please contact your surgeons office.  Oral Hygiene is also important to reduce your risk of infection.                                    Remember - BRUSH YOUR TEETH THE MORNING OF SURGERY WITH YOUR REGULAR TOOTHPASTE   Stop taking all vitamins and supplements 7 days before surgery   Take these medicines the morning of surgery with A SIP OF WATER: Xanax, Zetia, Synthroid, Metoprolol, Omeprazole                              You may not have any metal on your body including hair pins, jewelry, and body piercing             Do not wear make-up, lotions, powders, perfumes, or deodorant  Do not wear nail polish including gel and S&S, artificial/acrylic nails, or any other type of covering on natural nails including finger and toenails. If you have artificial nails, gel coating, etc. that needs to be removed by a nail salon please have this removed prior to surgery or surgery may need to be canceled/ delayed if the surgeon/ anesthesia feels like they are unable to be safely monitored.   Do not shave  48 hours prior to surgery.    Do not bring valuables to the hospital. [place].   Bring small overnight bag day of surgery.              Please read over the following fact sheets you were given: IF YOU HAVE QUESTIONS ABOUT YOUR PRE-OP INSTRUCTIONS PLEASE CALL [facility] - Preparing for Surgery Before surgery, you can play an important role.  Because skin is not sterile, your skin needs to be as free of germs as possible.  You can reduce the number of germs on your skin by washing with CHG (chlorahexidine gluconate) soap before surgery.  CHG is an antiseptic cleaner which kills germs and bonds with the skin to continue killing germs even after washing. Please DO NOT use if you have an allergy to CHG or antibacterial soaps.  If your skin becomes  reddened/irritated stop using the CHG and inform your nurse when you arrive at Short Stay. Do not shave (including legs and underarms) for at least 48 hours prior to the first CHG shower.  You may shave your face/neck.  Please follow these instructions carefully:  1.  Shower with CHG Soap the night before surgery and the  morning of surgery.  2.  If you choose to wash your hair, wash your hair first as usual with your normal  shampoo.  3.  After you shampoo, rinse your hair and body thoroughly to remove the shampoo.                             4.  Use CHG as you would any other liquid soap.  You can apply chg directly to the skin and wash.  Gently with a scrungie or clean washcloth.  5.  Apply the CHG Soap to your body ONLY FROM THE NECK DOWN.   Do   not use on face/ open  Wound or open sores. Avoid contact with eyes, ears mouth and   genitals (private parts).                       Wash face,  Genitals (private parts) with your normal soap.             6.  Wash thoroughly, paying special attention to the area where your    surgery  will be performed.  7.  Thoroughly rinse your body with warm water from the neck down.  8.  DO NOT shower/wash with your normal soap after using and rinsing off the CHG Soap.                9.  Pat yourself dry with a clean towel.            10.  Wear clean pajamas.            11.  Place clean sheets on your bed the night of your first shower and do not  sleep with pets. Day of Surgery : Do not apply any lotions/deodorants the morning of surgery.  Please wear clean clothes to the hospital/surgery center.  FAILURE TO FOLLOW THESE INSTRUCTIONS MAY RESULT IN THE CANCELLATION OF YOUR SURGERY  PATIENT SIGNATURE_________________________________  NURSE SIGNATURE__________________________________  ________________________________________________________________________   Brittany Santiago  An incentive spirometer is a tool that can help  keep your lungs clear and active. This tool measures how well you are filling your lungs with each breath. Taking long deep breaths may help reverse or decrease the chance of developing breathing (pulmonary) problems (especially infection) following: A long period of time when you are unable to move or be active. BEFORE THE PROCEDURE  If the spirometer includes an indicator to show your best effort, your nurse or respiratory therapist will set it to a desired goal. If possible, sit up straight or lean slightly forward. Try not to slouch. Hold the incentive spirometer in an upright position. INSTRUCTIONS FOR USE  Sit on the edge of your bed if possible, or sit up as far as you can in bed or on a chair. Hold the incentive spirometer in an upright position. Breathe out normally. Place the mouthpiece in your mouth and seal your lips tightly around it. Breathe in slowly and as deeply as possible, raising the piston or the ball toward the top of the column. Hold your breath for 3-5 seconds or for as long as possible. Allow the piston or ball to fall to the bottom of the column. Remove the mouthpiece from your mouth and breathe out normally. Rest for a few seconds and repeat Steps 1 through 7 at least 10 times every 1-2 hours when you are awake. Take your time and take a few normal breaths between deep breaths. The spirometer may include an indicator to show your best effort. Use the indicator as a goal to work toward during each repetition. After each set of 10 deep breaths, practice coughing to be sure your lungs are clear. If you have an incision (the cut made at the time of surgery), support your incision when coughing by placing a pillow or rolled up towels firmly against it. Once you are able to get out of bed, walk around indoors and cough well. You may stop using the incentive spirometer when instructed by your caregiver.  RISKS AND COMPLICATIONS Take your time so you do not get dizzy or  light-headed. If you are in pain, you  may need to take or ask for pain medication before doing incentive spirometry. It is harder to take a deep breath if you are having pain. AFTER USE Rest and breathe slowly and easily. It can be helpful to keep track of a log of your progress. Your caregiver can provide you with a simple table to help with this. If you are using the spirometer at home, follow these instructions: [place] IF:  You are having difficultly using the spirometer. You have trouble using the spirometer as often as instructed. Your pain medication is not giving enough relief while using the spirometer. You develop fever of 100.5 F (38.1 C) or higher. SEEK IMMEDIATE MEDICAL CARE IF:  You cough up bloody sputum that had not been present before. You develop fever of 102 F (38.9 C) or greater. You develop worsening pain at or near the incision site. MAKE SURE YOU:  Understand these instructions. Will watch your condition. Will get help right away if you are not doing well or get worse. Document Released: 06/23/2006 Document Revised: 05/05/2011 Document Reviewed: 08/24/2006 ExitCare Patient Information 2014 Exit[employer], [place].   ________________________________________________________________________  WHAT IS A BLOOD TRANSFUSION? Blood Transfusion Information  A transfusion is the replacement of blood or some of its parts. Blood is made up of multiple cells which provide different functions. Red blood cells carry oxygen and are used for blood loss replacement. White blood cells fight against infection. Platelets control bleeding. Plasma helps clot blood. Other blood products are available for specialized needs, such as hemophilia or other clotting disorders. BEFORE THE TRANSFUSION  Who gives blood for transfusions?  Healthy volunteers who are fully evaluated to make sure their blood is safe. This is blood bank blood. Transfusion therapy is the safest it has ever  been in the practice of medicine. Before blood is taken from a donor, a complete history is taken to make sure that person has no history of diseases nor engages in risky social behavior (examples are intravenous drug use or sexual activity with multiple partners). The donor's travel history is screened to minimize risk of transmitting infections, such as malaria. The donated blood is tested for signs of infectious diseases, such as HIV and hepatitis. The blood is then tested to be sure it is compatible with you in order to minimize the chance of a transfusion reaction. If you or a relative donates blood, this is often done in anticipation of surgery and is not appropriate for emergency situations. It takes many days to process the donated blood. RISKS AND COMPLICATIONS Although transfusion therapy is very safe and saves many lives, the main dangers of transfusion include:  Getting an infectious disease. Developing a transfusion reaction. This is an allergic reaction to something in the blood you were given. Every precaution is taken to prevent this. The decision to have a blood transfusion has been considered carefully by your caregiver before blood is given. Blood is not given unless the benefits outweigh the risks. AFTER THE TRANSFUSION Right after receiving a blood transfusion, you will usually feel much better and more energetic. This is especially true if your red blood cells have gotten low (anemic). The transfusion raises the level of the red blood cells which carry oxygen, and this usually causes an energy increase. The nurse administering the transfusion will monitor you carefully for complications. HOME CARE INSTRUCTIONS  No special instructions are needed after a transfusion. You may find your energy is better. Speak with your caregiver about any limitations on activity  for underlying diseases you may have. SEEK MEDICAL CARE IF:  Your condition is not improving after your transfusion. You  develop redness or irritation at the intravenous (IV) site. SEEK IMMEDIATE MEDICAL CARE IF:  Any of the following symptoms occur over the next 12 hours: Shaking chills. You have a temperature by mouth above 102 F (38.9 C), not controlled by medicine. Chest, back, or muscle pain. People around you feel you are not acting correctly or are confused. Shortness of breath or difficulty breathing. Dizziness and fainting. You get a rash or develop hives. You have a decrease in urine output. Your urine turns a dark color or changes to pink, red, or brown. Any of the following symptoms occur over the next 10 days: You have a temperature by mouth above 102 F (38.9 C), not controlled by medicine. Shortness of breath. Weakness after normal activity. The white part of the eye turns yellow (jaundice). You have a decrease in the amount of urine or are urinating less often. Your urine turns a dark color or changes to pink, red, or brown. Document Released: 02/08/2000 Document Revised: 05/05/2011 Document Reviewed: 09/27/2007 [facility] Patient Information 2014 [place], [place].  _______________________________________________________________________

## 2021-02-15 ENCOUNTER — Encounter (HOSPITAL_COMMUNITY): Payer: Self-pay

## 2021-02-15 ENCOUNTER — Encounter (HOSPITAL_COMMUNITY)
Admission: RE | Admit: 2021-02-15 | Discharge: 2021-02-15 | Disposition: A | Discharge status: Home / Self Care | Payer: Medicare Other | Source: Ambulatory Visit | Attending: Orthopedic Surgery | Admitting: Orthopedic Surgery

## 2021-02-15 ENCOUNTER — Other Ambulatory Visit: Payer: Self-pay

## 2021-02-15 VITALS — BP 158/92 | HR 78 | Temp 98.7°F | Resp 16 | Ht 60.0 in | Wt 132.0 lb

## 2021-02-15 DIAGNOSIS — T84090A Other mechanical complication of internal right hip prosthesis, initial encounter: Secondary | ICD-10-CM | POA: Insufficient documentation

## 2021-02-15 DIAGNOSIS — I2510 Atherosclerotic heart disease of native coronary artery without angina pectoris: Secondary | ICD-10-CM | POA: Diagnosis not present

## 2021-02-15 DIAGNOSIS — M1909 Primary osteoarthritis, other specified site: Secondary | ICD-10-CM

## 2021-02-15 DIAGNOSIS — Z01818 Encounter for other preprocedural examination: Secondary | ICD-10-CM

## 2021-02-15 DIAGNOSIS — I350 Nonrheumatic aortic (valve) stenosis: Secondary | ICD-10-CM | POA: Diagnosis not present

## 2021-02-15 DIAGNOSIS — Z01812 Encounter for preprocedural laboratory examination: Secondary | ICD-10-CM | POA: Insufficient documentation

## 2021-02-15 DIAGNOSIS — E039 Hypothyroidism, unspecified: Secondary | ICD-10-CM | POA: Diagnosis not present

## 2021-02-15 DIAGNOSIS — Y838 Other surgical procedures as the cause of abnormal reaction of the patient, or of later complication, without mention of misadventure at the time of the procedure: Secondary | ICD-10-CM | POA: Insufficient documentation

## 2021-02-15 DIAGNOSIS — I10 Essential (primary) hypertension: Secondary | ICD-10-CM | POA: Diagnosis not present

## 2021-02-15 HISTORY — DX: Gastro-esophageal reflux disease without esophagitis: K21.9

## 2021-02-15 LAB — COMPREHENSIVE METABOLIC PANEL
ALT: 44 U/L (ref 0–44)
AST: 62 U/L — ABNORMAL HIGH (ref 15–41)
Albumin: 4.2 g/dL (ref 3.5–5.0)
Alkaline Phosphatase: 71 U/L (ref 38–126)
Anion gap: 8 (ref 5–15)
BUN: 19 mg/dL (ref 8–23)
CO2: 27 mmol/L (ref 22–32)
Calcium: 9.5 mg/dL (ref 8.9–10.3)
Chloride: 104 mmol/L (ref 98–111)
Creatinine, Ser: 1.44 mg/dL — ABNORMAL HIGH (ref 0.44–1.00)
GFR, Estimated: 35 mL/min — ABNORMAL LOW (ref >60)
Glucose, Bld: 146 mg/dL — ABNORMAL HIGH (ref 70–99)
Potassium: 4.3 mmol/L (ref 3.5–5.1)
Sodium: 139 mmol/L (ref 135–145)
Total Bilirubin: 1.2 mg/dL (ref 0.3–1.2)
Total Protein: 7.8 g/dL (ref 6.5–8.1)

## 2021-02-15 LAB — CBC
HCT: 37.6 % (ref 36.0–46.0)
Hemoglobin: 12.4 g/dL (ref 12.0–15.0)
MCH: 30.1 pg (ref 26.0–34.0)
MCHC: 33 g/dL (ref 30.0–36.0)
MCV: 91.3 fL (ref 80.0–100.0)
Platelets: 241 10*3/uL (ref 150–400)
RBC: 4.12 MIL/uL (ref 3.87–5.11)
RDW: 13.5 % (ref 11.5–15.5)
WBC: 6.4 10*3/uL (ref 4.0–10.5)
nRBC: 0 % (ref 0.0–0.2)

## 2021-02-15 LAB — TYPE AND SCREEN
ABO/RH(D): A POS
Antibody Screen: NEGATIVE

## 2021-02-15 LAB — PROTIME-INR
INR: 1 (ref 0.8–1.2)
Prothrombin Time: 13.4 seconds (ref 11.4–15.2)

## 2021-02-15 LAB — SURGICAL PCR SCREEN
MRSA, PCR: NEGATIVE
Staphylococcus aureus: NEGATIVE

## 2021-02-15 NOTE — Progress Notes (Signed)
Anesthesia Chart Review   Case: [id number] Date/Time: 02/27/21 0815   Procedure: Conversion of previous hemiarthroplasty  to right total hip arthroplasty (Right: Hip)   Anesthesia type: Spinal   Pre-op diagnosis: failed right hip hemiarthroplasty   Location: [facility] ROOM 07 / [facility]   Surgeons: Rod Can, MD       DISCUSSION:87 y.o. never smoker with h/o HTN, hypothyroidism, CAD, mild aortic stenosis, failed right hip hemiarthroplasty scheduled for above procedure 02/27/2021 with Dr. Rod Can.   Per cardiology preoperative evaluation 01/14/2021, "Chart reviewed as part of pre-operative protocol coverage. Patient was contacted 01/14/2021 in reference to pre-operative risk assessment for pending surgery as outlined below.  Brittany Santiago was last seen on 08/10/20 by Dr. Geraldo Pitter.  Since that day, Brittany Santiago has done well. She is able to get at least 4 METS of activity.    Therefore, based on ACC/AHA guidelines, the patient would be at acceptable risk for the planned procedure without further cardiovascular testing. "  Anticipate pt can proceed with planned procedure barring acute status change.   VS: Pulse 78    Temp 37.1 C (Oral)    Resp 16    Ht 5' (1.524 m)    Wt 59.9 kg    SpO2 97%    BMI 25.78 kg/m   PROVIDERS: Brittany Dress, MD is PCP   Cardiologist - Brittany Heinz, MD LABS: Labs reviewed: Acceptable for surgery. (all labs ordered are listed, but only abnormal results are displayed)  Labs Reviewed  COMPREHENSIVE METABOLIC PANEL - Abnormal; Notable for the following components:      Result Value   Glucose, Bld 146 (*)    Creatinine, Ser 1.44 (*)    AST 62 (*)    GFR, Estimated 35 (*)    All other components within normal limits  SURGICAL PCR SCREEN  CBC  PROTIME-INR  TYPE AND SCREEN     IMAGES:   EKG: 08/10/2020 Rate 68 bpm  NSR Rightward axis  Incomplete LBBB Nonspecific T wave abnormality   CV: Echo 11/11/2019 1. Left ventricular ejection  fraction, by estimation, is 60 to 65%. The  left ventricle has normal function. The left ventricle has no regional  wall motion abnormalities. There is moderate concentric left ventricular  hypertrophy. Left ventricular  diastolic parameters are consistent with Grade I diastolic dysfunction  (impaired relaxation).   2. Right ventricular systolic function is normal. The right ventricular  size is normal. There is normal pulmonary artery systolic pressure.   3. The mitral valve is normal in structure. No evidence of mitral valve  regurgitation. No evidence of mitral stenosis.   4. The aortic valve is tricuspid. Aortic valve regurgitation is mild to  moderate. Mild aortic valve stenosis.   5. The inferior vena cava is normal in size with greater than 50%  respiratory variability, suggesting right atrial pressure of 3 mmHg.   Myocardial Perfusion 11/01/2019 The left ventricular ejection fraction is hyperdynamic (>65%). Nuclear stress EF: 66%. The study is normal. This is a low risk study. Past Medical History:  Diagnosis Date   Asthma, moderate persistent 08/02/2013   6/9/2015[place]: normal CXR normal  ; DENIS , STATES I HAD BRONCHITIS BEFORE AND USED AN INHALER TEMPORARILY    Atherosclerosis of coronary artery 03/16/2015   Cancer [facility])    endometrial    Coronary artery disease involving native coronary artery of native heart without angina pectoris 03/16/2015   Coronary artery disease of native artery of native heart with  stable angina pectoris ([place]) 03/16/2015   Coronary atherosclerosis of native coronary artery 03/16/2015   Dyslipidemia 03/10/2017   Endometrial cancer ([place]) 05/27/2017   Enterocele 12/30/2017   Female bladder prolapse    GERD (gastroesophageal reflux disease)    Hyperlipidemia    Hypertension    Hypothyroidism    Left bundle branch block (LBBB) 03/04/2017   Menopausal symptom 05/29/2017   Mild aortic regurgitation 11/15/2019   Mild aortic stenosis 11/15/2019    Pre-operative cardiovascular examination 05/29/2017   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 Added automatically from request for surgery [id number]    Past Surgical History:  Procedure Laterality Date   ABDOMINAL HYSTERECTOMY  2019   APPENDECTOMY     yaken with first c section    CESAREAN SECTION     x2    CORONARY ARTERY BYPASS GRAFT  02/2015   DOUBLE BYPASS ; DONE AT  [place]   bilateral cataract extraction    JOINT REPLACEMENT Right    hip   KNEE ARTHROSCOPY Left    neck back surgery     ROBOTIC ASSISTED TOTAL HYSTERECTOMY WITH BILATERAL SALPINGO OOPHERECTOMY Bilateral 06/15/2017   Procedure: XI ROBOTIC ASSISTED TOTAL HYSTERECTOMY WITH BILATERAL SALPINGO OOPHORECTOMY WITH SENTINAL LYMPH NODES, PELVIC LYMPHADENECTOMY, AND LYSIS OF ADHESIONS;  Surgeon: Everitt Amber, MD;  Location: [facility];  Service: Gynecology;  Laterality: Bilateral;   ROTATOR CUFF REPAIR Left    THYROID SURGERY     THYROIDECTOMY  2000s   VAGINAL PROLAPSE REPAIR      MEDICATIONS:  ALPRAZolam (XANAX) 0.25 MG tablet   aspirin 81 MG tablet   atorvastatin (LIPITOR) 80 MG tablet   azelastine (OPTIVAR) 0.05 % ophthalmic solution   Calcium Carb-Cholecalciferol (CALCIUM 500 + D PO)   cholecalciferol (VITAMIN D3) 25 MCG (1000 UNIT) tablet   ezetimibe (ZETIA) 10 MG tablet   Fiber POWD   furosemide (LASIX) 40 MG tablet   levothyroxine (SYNTHROID) 75 MCG tablet   lisinopril (PRINIVIL,ZESTRIL) 10 MG tablet   metoprolol succinate (TOPROL-XL) 50 MG 24 hr tablet   Multiple Vitamin (MULTIVITAMIN WITH MINERALS) TABS tablet   nitroGLYCERIN (NITROSTAT) 0.4 MG SL tablet   omeprazole (PRILOSEC) 20 MG capsule   No current facility-administered medications for this encounter.     Konrad Felix Ward, PA-C [facility] Pre-Surgical Testing [phone]

## 2021-02-15 NOTE — Anesthesia Preprocedure Evaluation (Addendum)
Anesthesia Evaluation  Patient identified by MRN, date of birth, ID band Patient awake    Reviewed: Allergy & Precautions, NPO status , Patient's Chart, lab work & pertinent test results, reviewed documented beta blocker date and time   History of Anesthesia Complications Negative for: history of anesthetic complications  Airway Mallampati: III  TM Distance: >3 FB Neck ROM: Full    Dental no notable dental hx.    Pulmonary neg pulmonary ROS,   Pulmonary exam normal         Cardiovascular hypertension, Pt. on medications and Pt. on home beta blockers + CAD and + CABG (2017)  Normal cardiovascular exam  TTE 10/2019: EF 60-65%, moderate LVH, grade I DD, mild to moderate AR, mild AS   EKG 08/10/2020: incomplete LBBB    Neuro/Psych negative neurological ROS     GI/Hepatic Neg liver ROS, GERD  Controlled and Medicated,  Endo/Other  Hypothyroidism   Renal/GU Renal InsufficiencyRenal disease (Cr 1.5)  negative genitourinary   Musculoskeletal failed right hip hemiarthroplasty   Abdominal   Peds  Hematology negative hematology ROS (+)   Anesthesia Other Findings Day of surgery medications reviewed with patient.  Reproductive/Obstetrics negative OB ROS                           Anesthesia Physical Anesthesia Plan  3ASA:   SpinalAnesthesia Plan:    Tylenol PO (pre-op)Post-op Pain Management:    Induction:   2 and Treatment may vary due to age or medical condition, Ondansetron, Dexamethasone and Propofol infusionPONV Risk Score and Plan:   Natural Airway and Simple Face MaskAirway Management Planned:   NoneAdditional Equipment:   Intra-op Plan:   Post-operative Plan:   I have reviewed the patients History and Physical, chart, labs and discussed the procedure including the risks, benefits and alternatives for the proposed anesthesia with the patient or authorized representative who has  indicated his/her understanding and acceptance.     Informed Consent:   CRNAPlan Discussed with:   Anesthesia Plan Comments: (See PAT note 02/15/2021, Konrad Felix Ward, PA-C)      Anesthesia Quick Evaluation

## 2021-02-26 ENCOUNTER — Ambulatory Visit: Payer: Self-pay | Admitting: Student

## 2021-02-26 ENCOUNTER — Encounter (HOSPITAL_COMMUNITY): Admission: RE | Admit: 2021-02-26 | Payer: Medicare Other | Source: Ambulatory Visit

## 2021-03-12 NOTE — Patient Instructions (Signed)
DUE TO COVID-19 ONLY ONE VISITOR IS ALLOWED TO COME WITH YOU AND STAY IN THE WAITING ROOM ONLY DURING PRE OP AND PROCEDURE.   **NO VISITORS ARE ALLOWED IN THE SHORT STAY AREA OR RECOVERY ROOM!!**  IF YOU WILL BE ADMITTED INTO THE HOSPITAL YOU ARE ALLOWED ONLY TWO SUPPORT PEOPLE DURING VISITATION HOURS ONLY (7 AM -8PM)   The support person(s) must pass our screening, gel in and out, and wear a mask at all times, including in the patients room. Patients must also wear a mask when staff or their support person are in the room. Visitors GUEST BADGE MUST BE WORN VISIBLY  One adult visitor may remain with you overnight and MUST be in the room by 8 P.M.  No visitors under the age of 23. Any visitor under the age of 2 must be accompanied by an adult.        Your procedure is scheduled on: 03/20/21   Report to [facility] Main Entrance    Report to admitting at 7:45 AM   Call this number if you have problems the morning of surgery [phone]   Do not eat food :After Midnight.   May have liquids until 7:30 AM day of surgery  CLEAR LIQUID DIET  Foods Allowed                                                                     Foods Excluded  Water, Black Coffee and tea (no milk or creamer)           liquids that you cannot  Plain Jell-O in any flavor  (No red)                                    see through such as: Fruit ices (not with fruit pulp)                                            milk, soups, orange juice              Iced Popsicles (No red)                                               All solid food                                   Apple juices Sports drinks like Gatorade (No red) Lightly seasoned clear broth or consume(fat free) Sugar     The day of surgery:  Drink ONE (1) Pre-Surgery Clear Ensure at 7:30 AM the morning of surgery. Drink in one sitting. Do not sip.  This drink was given to you during your hospital  pre-op appointment visit. Nothing else to drink  after completing the  Pre-Surgery Clear Ensure.          If you have questions, please contact  your surgeons office.     Oral Hygiene is also important to reduce your risk of infection.                                    Remember - BRUSH YOUR TEETH THE MORNING OF SURGERY WITH YOUR REGULAR TOOTHPASTE   Do NOT smoke after Midnight   Take these medicines the morning of surgery with A SIP OF WATER: Xanax, Zetia, Synthroid, Metoprolol, Omeprazole                              You may not have any metal on your body including hair pins, jewelry, and body piercing             Do not wear make-up, lotions, powders, perfumes, or deodorant  Do not wear nail polish including gel and S&S, artificial/acrylic nails, or any other type of covering on natural nails including finger and toenails. If you have artificial nails, gel coating, etc. that needs to be removed by a nail salon please have this removed prior to surgery or surgery may need to be canceled/ delayed if the surgeon/ anesthesia feels like they are unable to be safely monitored.   Do not shave  48 hours prior to surgery.    Do not bring valuables to the hospital. [place].   Contacts, dentures or bridgework may not be worn into surgery.   Bring small overnight bag day of surgery.    Special Instructions: Bring a copy of your healthcare power of attorney and living will documents         the day of surgery if you haven't scanned them before.              Please read over the following fact sheets you were given: IF YOU HAVE QUESTIONS ABOUT YOUR PRE-OP INSTRUCTIONS PLEASE CALL [place] - Preparing for Surgery Before surgery, you can play an important role.  Because skin is not sterile, your skin needs to be as free of germs as possible.  You can reduce the number of germs on your skin by washing with CHG (chlorahexidine gluconate) soap before surgery.  CHG is  an antiseptic cleaner which kills germs and bonds with the skin to continue killing germs even after washing. Please DO NOT use if you have an allergy to CHG or antibacterial soaps.  If your skin becomes reddened/irritated stop using the CHG and inform your nurse when you arrive at Short Stay. Do not shave (including legs and underarms) for at least 48 hours prior to the first CHG shower.  You may shave your face/neck.  Please follow these instructions carefully:  1.  Shower with CHG Soap the night before surgery and the  morning of surgery.  2.  If you choose to wash your hair, wash your hair first as usual with your normal  shampoo.  3.  After you shampoo, rinse your hair and body thoroughly to remove the shampoo.                             4.  Use CHG as you would any other liquid soap.  You can apply  chg directly to the skin and wash.  Gently with a scrungie or clean washcloth.  5.  Apply the CHG Soap to your body ONLY FROM THE NECK DOWN.   Do   not use on face/ open                           Wound or open sores. Avoid contact with eyes, ears mouth and   genitals (private parts).                       Wash face,  Genitals (private parts) with your normal soap.             6.  Wash thoroughly, paying special attention to the area where your    surgery  will be performed.  7.  Thoroughly rinse your body with warm water from the neck down.  8.  DO NOT shower/wash with your normal soap after using and rinsing off the CHG Soap.                9.  Pat yourself dry with a clean towel.            10.  Wear clean pajamas.            11.  Place clean sheets on your bed the night of your first shower and do not  sleep with pets. Day of Surgery : Do not apply any lotions/deodorants the morning of surgery.  Please wear clean clothes to the hospital/surgery center.  FAILURE TO FOLLOW THESE INSTRUCTIONS MAY RESULT IN THE CANCELLATION OF YOUR SURGERY  PATIENT  SIGNATURE_________________________________  NURSE SIGNATURE__________________________________  ________________________________________________________________________  WHAT IS A BLOOD TRANSFUSION? Blood Transfusion Information  A transfusion is the replacement of blood or some of its parts. Blood is made up of multiple cells which provide different functions. Red blood cells carry oxygen and are used for blood loss replacement. White blood cells fight against infection. Platelets control bleeding. Plasma helps clot blood. Other blood products are available for specialized needs, such as hemophilia or other clotting disorders. BEFORE THE TRANSFUSION  Who gives blood for transfusions?  Healthy volunteers who are fully evaluated to make sure their blood is safe. This is blood bank blood. Transfusion therapy is the safest it has ever been in the practice of medicine. Before blood is taken from a donor, a complete history is taken to make sure that person has no history of diseases nor engages in risky social behavior (examples are intravenous drug use or sexual activity with multiple partners). The donor's travel history is screened to minimize risk of transmitting infections, such as malaria. The donated blood is tested for signs of infectious diseases, such as HIV and hepatitis. The blood is then tested to be sure it is compatible with you in order to minimize the chance of a transfusion reaction. If you or a relative donates blood, this is often done in anticipation of surgery and is not appropriate for emergency situations. It takes many days to process the donated blood. RISKS AND COMPLICATIONS Although transfusion therapy is very safe and saves many lives, the main dangers of transfusion include:  Getting an infectious disease. Developing a transfusion reaction. This is an allergic reaction to something in the blood you were given. Every precaution is taken to prevent this. The decision to  have a blood transfusion has been considered carefully by your caregiver before blood is given.  Blood is not given unless the benefits outweigh the risks. AFTER THE TRANSFUSION Right after receiving a blood transfusion, you will usually feel much better and more energetic. This is especially true if your red blood cells have gotten low (anemic). The transfusion raises the level of the red blood cells which carry oxygen, and this usually causes an energy increase. The nurse administering the transfusion will monitor you carefully for complications. HOME CARE INSTRUCTIONS  No special instructions are needed after a transfusion. You may find your energy is better. Speak with your caregiver about any limitations on activity for underlying diseases you may have. SEEK MEDICAL CARE IF:  Your condition is not improving after your transfusion. You develop redness or irritation at the intravenous (IV) site. SEEK IMMEDIATE MEDICAL CARE IF:  Any of the following symptoms occur over the next 12 hours: Shaking chills. You have a temperature by mouth above 102 F (38.9 C), not controlled by medicine. Chest, back, or muscle pain. People around you feel you are not acting correctly or are confused. Shortness of breath or difficulty breathing. Dizziness and fainting. You get a rash or develop hives. You have a decrease in urine output. Your urine turns a dark color or changes to pink, red, or brown. Any of the following symptoms occur over the next 10 days: You have a temperature by mouth above 102 F (38.9 C), not controlled by medicine. Shortness of breath. Weakness after normal activity. The white part of the eye turns yellow (jaundice). You have a decrease in the amount of urine or are urinating less often. Your urine turns a dark color or changes to pink, red, or brown. Document Released: 02/08/2000 Document Revised: 05/05/2011 Document Reviewed: 09/27/2007 [employer] Patient Information 2014  [place].  _______________________________________________________________________   Incentive Spirometer  An incentive spirometer is a tool that can help keep your lungs clear and active. This tool measures how well you are filling your lungs with each breath. Taking long deep breaths may help reverse or decrease the chance of developing breathing (pulmonary) problems (especially infection) following: A long period of time when you are unable to move or be active. BEFORE THE PROCEDURE  If the spirometer includes an indicator to show your best effort, your nurse or respiratory therapist will set it to a desired goal. If possible, sit up straight or lean slightly forward. Try not to slouch. Hold the incentive spirometer in an upright position. INSTRUCTIONS FOR USE  Sit on the edge of your bed if possible, or sit up as far as you can in bed or on a chair. Hold the incentive spirometer in an upright position. Breathe out normally. Place the mouthpiece in your mouth and seal your lips tightly around it. Breathe in slowly and as deeply as possible, raising the piston or the ball toward the top of the column. Hold your breath for 3-5 seconds or for as long as possible. Allow the piston or ball to fall to the bottom of the column. Remove the mouthpiece from your mouth and breathe out normally. Rest for a few seconds and repeat Steps 1 through 7 at least 10 times every 1-2 hours when you are awake. Take your time and take a few normal breaths between deep breaths. The spirometer may include an indicator to show your best effort. Use the indicator as a goal to work toward during each repetition. After each set of 10 deep breaths, practice coughing to be sure your lungs are clear. If you have an  incision (the cut made at the time of surgery), support your incision when coughing by placing a pillow or rolled up towels firmly against it. Once you are able to get out of bed, walk around indoors and  cough well. You may stop using the incentive spirometer when instructed by your caregiver.  RISKS AND COMPLICATIONS Take your time so you do not get dizzy or light-headed. If you are in pain, you may need to take or ask for pain medication before doing incentive spirometry. It is harder to take a deep breath if you are having pain. AFTER USE Rest and breathe slowly and easily. It can be helpful to keep track of a log of your progress. Your caregiver can provide you with a simple table to help with this. If you are using the spirometer at home, follow these instructions: [place] IF:  You are having difficultly using the spirometer. You have trouble using the spirometer as often as instructed. Your pain medication is not giving enough relief while using the spirometer. You develop fever of 100.5 F (38.1 C) or higher. SEEK IMMEDIATE MEDICAL CARE IF:  You cough up bloody sputum that had not been present before. You develop fever of 102 F (38.9 C) or greater. You develop worsening pain at or near the incision site. MAKE SURE YOU:  Understand these instructions. Will watch your condition. Will get help right away if you are not doing well or get worse. Document Released: 06/23/2006 Document Revised: 05/05/2011 Document Reviewed: 08/24/2006 [facility] Patient Information 2014 [place], [place].   ________________________________________________________________________

## 2021-03-12 NOTE — Progress Notes (Signed)
COVID swab appointment: positive home test 02/19/21   COVID Vaccine Completed: yes x3 Date COVID Vaccine completed: Has received booster: COVID vaccine manufacturer: [place]    Date of COVID positive in last 90 days: no   PCP - Nelda Bucks, MD Cardiologist - Jyl Heinz, MD   Cardiac clearance by Leanor Kail, PA 01/14/21 in Epic   Chest x-ray - n/a EKG - 08/10/20 Epic Stress Test - 11/01/19 Epic ECHO - 11/11/19 Epic Cardiac Cath - 2017 Pacemaker/ICD device last checked: n/a Spinal Cord Stimulator: n/a   Sleep Study - n/a CPAP -    Fasting Blood Sugar - n/a Checks Blood Sugar _____ times a day   Blood Thinner Instructions: ASA 81, stopping 7 days Aspirin Instructions: Last Dose:   Activity level: Can go up a flight of stairs and perform activities of daily living without stopping and without symptoms of chest pain or shortness of breath.                       Anesthesia review: HTN,CAD, LBBB, aortic stenosis, asthma, CABG   Patient denies shortness of breath, fever, cough and chest pain at PAT appointment     Patient verbalized understanding of instructions that were given to them at the PAT appointment. Patient was also instructed that they will need to review over the PAT instructions again at home before surgery.

## 2021-03-13 ENCOUNTER — Encounter (HOSPITAL_COMMUNITY)
Admission: RE | Admit: 2021-03-13 | Discharge: 2021-03-13 | Disposition: A | Discharge status: Home / Self Care | Payer: Medicare Other | Source: Ambulatory Visit | Attending: Orthopedic Surgery | Admitting: Orthopedic Surgery

## 2021-03-13 ENCOUNTER — Encounter (HOSPITAL_COMMUNITY): Payer: Self-pay

## 2021-03-13 ENCOUNTER — Other Ambulatory Visit: Payer: Self-pay

## 2021-03-13 DIAGNOSIS — I2510 Atherosclerotic heart disease of native coronary artery without angina pectoris: Secondary | ICD-10-CM | POA: Insufficient documentation

## 2021-03-13 DIAGNOSIS — Z01810 Encounter for preprocedural cardiovascular examination: Secondary | ICD-10-CM

## 2021-03-13 DIAGNOSIS — Z01812 Encounter for preprocedural laboratory examination: Secondary | ICD-10-CM | POA: Insufficient documentation

## 2021-03-13 LAB — COMPREHENSIVE METABOLIC PANEL
ALT: 57 U/L — ABNORMAL HIGH (ref 0–44)
AST: 72 U/L — ABNORMAL HIGH (ref 15–41)
Albumin: 4.3 g/dL (ref 3.5–5.0)
Alkaline Phosphatase: 61 U/L (ref 38–126)
Anion gap: 8 (ref 5–15)
BUN: 24 mg/dL — ABNORMAL HIGH (ref 8–23)
CO2: 26 mmol/L (ref 22–32)
Calcium: 9.7 mg/dL (ref 8.9–10.3)
Chloride: 104 mmol/L (ref 98–111)
Creatinine, Ser: 1.5 mg/dL — ABNORMAL HIGH (ref 0.44–1.00)
GFR, Estimated: 33 mL/min — ABNORMAL LOW (ref >60)
Glucose, Bld: 104 mg/dL — ABNORMAL HIGH (ref 70–99)
Potassium: 5.1 mmol/L (ref 3.5–5.1)
Sodium: 138 mmol/L (ref 135–145)
Total Bilirubin: 0.9 mg/dL (ref 0.3–1.2)
Total Protein: 8 g/dL (ref 6.5–8.1)

## 2021-03-13 LAB — CBC
HCT: 39.7 % (ref 36.0–46.0)
Hemoglobin: 13.2 g/dL (ref 12.0–15.0)
MCH: 30 pg (ref 26.0–34.0)
MCHC: 33.2 g/dL (ref 30.0–36.0)
MCV: 90.2 fL (ref 80.0–100.0)
Platelets: 273 10*3/uL (ref 150–400)
RBC: 4.4 MIL/uL (ref 3.87–5.11)
RDW: 13.7 % (ref 11.5–15.5)
WBC: 6.5 10*3/uL (ref 4.0–10.5)
nRBC: 0 % (ref 0.0–0.2)

## 2021-03-13 LAB — SURGICAL PCR SCREEN
MRSA, PCR: NEGATIVE
Staphylococcus aureus: NEGATIVE

## 2021-03-13 NOTE — Patient Instructions (Signed)
DUE TO COVID-19 ONLY ONE VISITOR IS ALLOWED TO COME WITH YOU AND STAY IN THE WAITING ROOM ONLY DURING PRE OP AND PROCEDURE.   **NO VISITORS ARE ALLOWED IN THE SHORT STAY AREA OR RECOVERY ROOM!!**  IF YOU WILL BE ADMITTED INTO THE HOSPITAL YOU ARE ALLOWED ONLY TWO SUPPORT PEOPLE DURING VISITATION HOURS ONLY (7 AM -8PM)   The support person(s) must pass our screening, gel in and out, and wear a mask at all times, including in the patients room. Patients must also wear a mask when staff or their support person are in the room. Visitors GUEST BADGE MUST BE WORN VISIBLY  One adult visitor may remain with you overnight and MUST be in the room by 8 P.M.  No visitors under the age of 60. Any visitor under the age of 67 must be accompanied by an adult.        Your procedure is scheduled on: 03/20/21    Report to [facility] Main Entrance    Report to admitting at 7:45 AM   Call this number if you have problems the morning of surgery [phone]   Do not eat food :After Midnight.   May have liquids until 7:30 AM day of surgery  CLEAR LIQUID DIET  Foods Allowed                                                                     Foods Excluded  Water, Black Coffee and tea (no milk or creamer)           liquids that you cannot  Plain Jell-O in any flavor  (No red)                                     see through such as: Fruit ices (not with fruit pulp)                                            milk, soups, orange juice              Iced Popsicles (No red)                                               All solid food                                   Apple juices Sports drinks like Gatorade (No red) Lightly seasoned clear broth or consume(fat free) Sugar    The day of surgery:  Drink ONE (1) Pre-Surgery Clear Ensure at 7:30 AM the morning of surgery. Drink in one sitting. Do not sip.  This drink was given to you during your hospital  pre-op appointment visit. Nothing else to drink  after completing the  Pre-Surgery Clear Ensure.          If you have questions, please  contact your surgeons office.     Oral Hygiene is also important to reduce your risk of infection.                                    Remember - BRUSH YOUR TEETH THE MORNING OF SURGERY WITH YOUR REGULAR TOOTHPASTE   Stop Aspirin 7 days before surgery   Take these medicines the morning of surgery with A SIP OF WATER: Xanax, Zetia, Synthroid, Metoprolol, Omeprazole                              You may not have any metal on your body including hair pins, jewelry, and body piercing             Do not wear make-up, lotions, powders, perfumes, or deodorant  Do not wear nail polish including gel and S&S, artificial/acrylic nails, or any other type of covering on natural nails including finger and toenails. If you have artificial nails, gel coating, etc. that needs to be removed by a nail salon please have this removed prior to surgery or surgery may need to be canceled/ delayed if the surgeon/ anesthesia feels like they are unable to be safely monitored.   Do not shave  48 hours prior to surgery.    Do not bring valuables to the hospital. [place].   Contacts, dentures or bridgework may not be worn into surgery.   Bring small overnight bag day of surgery.   Special Instructions: Bring a copy of your healthcare power of attorney and living will documents         the day of surgery if you haven't scanned them before.              Please read over the following fact sheets you were given: IF YOU HAVE QUESTIONS ABOUT YOUR PRE-OP INSTRUCTIONS PLEASE CALL [place] - Preparing for Surgery Before surgery, you can play an important role.  Because skin is not sterile, your skin needs to be as free of germs as possible.  You can reduce the number of germs on your skin by washing with CHG (chlorahexidine gluconate) soap before surgery.   CHG is an antiseptic cleaner which kills germs and bonds with the skin to continue killing germs even after washing. Please DO NOT use if you have an allergy to CHG or antibacterial soaps.  If your skin becomes reddened/irritated stop using the CHG and inform your nurse when you arrive at Short Stay. Do not shave (including legs and underarms) for at least 48 hours prior to the first CHG shower.  You may shave your face/neck.  Please follow these instructions carefully:  1.  Shower with CHG Soap the night before surgery and the  morning of surgery.  2.  If you choose to wash your hair, wash your hair first as usual with your normal  shampoo.  3.  After you shampoo, rinse your hair and body thoroughly to remove the shampoo.                             4.  Use CHG as you would any other liquid soap.  You can  apply chg directly to the skin and wash.  Gently with a scrungie or clean washcloth.  5.  Apply the CHG Soap to your body ONLY FROM THE NECK DOWN.   Do   not use on face/ open                           Wound or open sores. Avoid contact with eyes, ears mouth and   genitals (private parts).                       Wash face,  Genitals (private parts) with your normal soap.             6.  Wash thoroughly, paying special attention to the area where your    surgery  will be performed.  7.  Thoroughly rinse your body with warm water from the neck down.  8.  DO NOT shower/wash with your normal soap after using and rinsing off the CHG Soap.                9.  Pat yourself dry with a clean towel.            10.  Wear clean pajamas.            11.  Place clean sheets on your bed the night of your first shower and do not  sleep with pets. Day of Surgery : Do not apply any lotions/deodorants the morning of surgery.  Please wear clean clothes to the hospital/surgery center.  FAILURE TO FOLLOW THESE INSTRUCTIONS MAY RESULT IN THE CANCELLATION OF YOUR SURGERY  PATIENT  SIGNATURE_________________________________  NURSE SIGNATURE__________________________________  ________________________________________________________________________  WHAT IS A BLOOD TRANSFUSION? Blood Transfusion Information  A transfusion is the replacement of blood or some of its parts. Blood is made up of multiple cells which provide different functions. Red blood cells carry oxygen and are used for blood loss replacement. White blood cells fight against infection. Platelets control bleeding. Plasma helps clot blood. Other blood products are available for specialized needs, such as hemophilia or other clotting disorders. BEFORE THE TRANSFUSION  Who gives blood for transfusions?  Healthy volunteers who are fully evaluated to make sure their blood is safe. This is blood bank blood. Transfusion therapy is the safest it has ever been in the practice of medicine. Before blood is taken from a donor, a complete history is taken to make sure that person has no history of diseases nor engages in risky social behavior (examples are intravenous drug use or sexual activity with multiple partners). The donor's travel history is screened to minimize risk of transmitting infections, such as malaria. The donated blood is tested for signs of infectious diseases, such as HIV and hepatitis. The blood is then tested to be sure it is compatible with you in order to minimize the chance of a transfusion reaction. If you or a relative donates blood, this is often done in anticipation of surgery and is not appropriate for emergency situations. It takes many days to process the donated blood. RISKS AND COMPLICATIONS Although transfusion therapy is very safe and saves many lives, the main dangers of transfusion include:  Getting an infectious disease. Developing a transfusion reaction. This is an allergic reaction to something in the blood you were given. Every precaution is taken to prevent this. The decision to  have a blood transfusion has been considered carefully by your caregiver before blood is  given. Blood is not given unless the benefits outweigh the risks. AFTER THE TRANSFUSION Right after receiving a blood transfusion, you will usually feel much better and more energetic. This is especially true if your red blood cells have gotten low (anemic). The transfusion raises the level of the red blood cells which carry oxygen, and this usually causes an energy increase. The nurse administering the transfusion will monitor you carefully for complications. HOME CARE INSTRUCTIONS  No special instructions are needed after a transfusion. You may find your energy is better. Speak with your caregiver about any limitations on activity for underlying diseases you may have. SEEK MEDICAL CARE IF:  Your condition is not improving after your transfusion. You develop redness or irritation at the intravenous (IV) site. SEEK IMMEDIATE MEDICAL CARE IF:  Any of the following symptoms occur over the next 12 hours: Shaking chills. You have a temperature by mouth above 102 F (38.9 C), not controlled by medicine. Chest, back, or muscle pain. People around you feel you are not acting correctly or are confused. Shortness of breath or difficulty breathing. Dizziness and fainting. You get a rash or develop hives. You have a decrease in urine output. Your urine turns a dark color or changes to pink, red, or brown. Any of the following symptoms occur over the next 10 days: You have a temperature by mouth above 102 F (38.9 C), not controlled by medicine. Shortness of breath. Weakness after normal activity. The white part of the eye turns yellow (jaundice). You have a decrease in the amount of urine or are urinating less often. Your urine turns a dark color or changes to pink, red, or brown. Document Released: 02/08/2000 Document Revised: 05/05/2011 Document Reviewed: 09/27/2007 [facility] Patient Information 2014  [place], [place].  _______________________________________________________________________

## 2021-03-19 ENCOUNTER — Encounter (HOSPITAL_COMMUNITY): Payer: Self-pay | Admitting: Orthopedic Surgery

## 2021-03-20 ENCOUNTER — Inpatient Hospital Stay (HOSPITAL_COMMUNITY): Payer: Medicare Other

## 2021-03-20 ENCOUNTER — Inpatient Hospital Stay (HOSPITAL_COMMUNITY): Payer: Medicare Other | Admitting: Physician Assistant

## 2021-03-20 ENCOUNTER — Encounter (HOSPITAL_COMMUNITY): Payer: Self-pay | Admitting: Orthopedic Surgery

## 2021-03-20 ENCOUNTER — Inpatient Hospital Stay (HOSPITAL_COMMUNITY)
Admission: RE | Admit: 2021-03-20 | Discharge: 2021-03-22 | DRG: 467 | Disposition: A | Discharge status: Home / Self Care | Payer: Medicare Other | Attending: Orthopedic Surgery | Admitting: Orthopedic Surgery

## 2021-03-20 ENCOUNTER — Other Ambulatory Visit: Payer: Self-pay

## 2021-03-20 ENCOUNTER — Encounter (HOSPITAL_COMMUNITY)
Admission: RE | Disposition: A | Discharge status: Home / Self Care | Payer: Self-pay | Source: Home / Self Care | Attending: Orthopedic Surgery

## 2021-03-20 DIAGNOSIS — Z90710 Acquired absence of both cervix and uterus: Secondary | ICD-10-CM | POA: Diagnosis not present

## 2021-03-20 DIAGNOSIS — M1611 Unilateral primary osteoarthritis, right hip: Secondary | ICD-10-CM | POA: Diagnosis present

## 2021-03-20 DIAGNOSIS — Z8051 Family history of malignant neoplasm of kidney: Secondary | ICD-10-CM | POA: Diagnosis not present

## 2021-03-20 DIAGNOSIS — I2510 Atherosclerotic heart disease of native coronary artery without angina pectoris: Secondary | ICD-10-CM | POA: Diagnosis present

## 2021-03-20 DIAGNOSIS — E890 Postprocedural hypothyroidism: Secondary | ICD-10-CM | POA: Diagnosis present

## 2021-03-20 DIAGNOSIS — Z8616 Personal history of COVID-19: Secondary | ICD-10-CM

## 2021-03-20 DIAGNOSIS — E785 Hyperlipidemia, unspecified: Secondary | ICD-10-CM | POA: Diagnosis present

## 2021-03-20 DIAGNOSIS — J4540 Moderate persistent asthma, uncomplicated: Secondary | ICD-10-CM | POA: Diagnosis present

## 2021-03-20 DIAGNOSIS — Z806 Family history of leukemia: Secondary | ICD-10-CM | POA: Diagnosis not present

## 2021-03-20 DIAGNOSIS — Z818 Family history of other mental and behavioral disorders: Secondary | ICD-10-CM

## 2021-03-20 DIAGNOSIS — T84099A Other mechanical complication of unspecified internal joint prosthesis, initial encounter: Secondary | ICD-10-CM | POA: Insufficient documentation

## 2021-03-20 DIAGNOSIS — Z79899 Other long term (current) drug therapy: Secondary | ICD-10-CM

## 2021-03-20 DIAGNOSIS — Z96641 Presence of right artificial hip joint: Secondary | ICD-10-CM | POA: Diagnosis not present

## 2021-03-20 DIAGNOSIS — Z8249 Family history of ischemic heart disease and other diseases of the circulatory system: Secondary | ICD-10-CM

## 2021-03-20 DIAGNOSIS — T84090A Other mechanical complication of internal right hip prosthesis, initial encounter: Secondary | ICD-10-CM | POA: Diagnosis present

## 2021-03-20 DIAGNOSIS — M48061 Spinal stenosis, lumbar region without neurogenic claudication: Secondary | ICD-10-CM | POA: Diagnosis present

## 2021-03-20 DIAGNOSIS — M1612 Unilateral primary osteoarthritis, left hip: Secondary | ICD-10-CM | POA: Diagnosis not present

## 2021-03-20 DIAGNOSIS — M4316 Spondylolisthesis, lumbar region: Secondary | ICD-10-CM | POA: Diagnosis not present

## 2021-03-20 DIAGNOSIS — S72041A Displaced fracture of base of neck of right femur, initial encounter for closed fracture: Secondary | ICD-10-CM

## 2021-03-20 DIAGNOSIS — Z09 Encounter for follow-up examination after completed treatment for conditions other than malignant neoplasm: Secondary | ICD-10-CM

## 2021-03-20 DIAGNOSIS — I447 Left bundle-branch block, unspecified: Secondary | ICD-10-CM | POA: Diagnosis present

## 2021-03-20 DIAGNOSIS — T84498A Other mechanical complication of other internal orthopedic devices, implants and grafts, initial encounter: Secondary | ICD-10-CM | POA: Diagnosis present

## 2021-03-20 DIAGNOSIS — Y792 Prosthetic and other implants, materials and accessory orthopedic devices associated with adverse incidents: Secondary | ICD-10-CM | POA: Diagnosis present

## 2021-03-20 DIAGNOSIS — K219 Gastro-esophageal reflux disease without esophagitis: Secondary | ICD-10-CM | POA: Diagnosis present

## 2021-03-20 DIAGNOSIS — I110 Hypertensive heart disease with heart failure: Secondary | ICD-10-CM | POA: Diagnosis present

## 2021-03-20 DIAGNOSIS — Z471 Aftercare following joint replacement surgery: Secondary | ICD-10-CM | POA: Diagnosis not present

## 2021-03-20 DIAGNOSIS — T84060A Wear of articular bearing surface of internal prosthetic right hip joint, initial encounter: Secondary | ICD-10-CM | POA: Diagnosis not present

## 2021-03-20 DIAGNOSIS — Z8542 Personal history of malignant neoplasm of other parts of uterus: Secondary | ICD-10-CM

## 2021-03-20 DIAGNOSIS — I10 Essential (primary) hypertension: Secondary | ICD-10-CM | POA: Diagnosis present

## 2021-03-20 DIAGNOSIS — G8929 Other chronic pain: Secondary | ICD-10-CM | POA: Diagnosis present

## 2021-03-20 DIAGNOSIS — D62 Acute posthemorrhagic anemia: Secondary | ICD-10-CM | POA: Diagnosis not present

## 2021-03-20 DIAGNOSIS — I5032 Chronic diastolic (congestive) heart failure: Secondary | ICD-10-CM | POA: Diagnosis present

## 2021-03-20 DIAGNOSIS — Z951 Presence of aortocoronary bypass graft: Secondary | ICD-10-CM

## 2021-03-20 DIAGNOSIS — M545 Low back pain, unspecified: Secondary | ICD-10-CM | POA: Diagnosis not present

## 2021-03-20 DIAGNOSIS — Z01810 Encounter for preprocedural cardiovascular examination: Secondary | ICD-10-CM

## 2021-03-20 DIAGNOSIS — E039 Hypothyroidism, unspecified: Secondary | ICD-10-CM | POA: Diagnosis not present

## 2021-03-20 HISTORY — PX: CONVERSION TO TOTAL HIP: SHX5784

## 2021-03-20 HISTORY — DX: Other mechanical complication of unspecified internal joint prosthesis, initial encounter: T84.099A

## 2021-03-20 HISTORY — DX: Other mechanical complication of other internal orthopedic devices, implants and grafts, initial encounter: T84.498A

## 2021-03-20 LAB — PREPARE RBC (CROSSMATCH)

## 2021-03-20 SURGERY — CONVERSION, PREVIOUS HIP SURGERY, TO TOTAL HIP ARTHROPLASTY
Anesthesia: Spinal | Site: Hip | Laterality: Right

## 2021-03-20 MED ORDER — VANCOMYCIN HCL 1000 MG IV SOLR
INTRAVENOUS | Status: DC | PRN
  Administered 2021-03-20: 08:00:00 1000 mg via INTRAVENOUS

## 2021-03-20 MED ORDER — FENTANYL CITRATE PF 50 MCG/ML IJ SOSY
PREFILLED_SYRINGE | INTRAMUSCULAR | Status: AC
  Filled 2021-03-20: qty 2

## 2021-03-20 MED ORDER — DEXAMETHASONE SODIUM PHOSPHATE 10 MG/ML IJ SOLN
INTRAMUSCULAR | Status: DC | PRN
  Administered 2021-03-20: 4 mg via INTRAVENOUS

## 2021-03-20 MED ORDER — DOCUSATE SODIUM 100 MG PO CAPS
100.0000 mg | ORAL_CAPSULE | Freq: Two times a day (BID) | ORAL | Status: DC
Start: 2021-03-20 — End: 2021-03-22
  Administered 2021-03-20 – 2021-03-22 (×4): 100 mg via ORAL
  Filled 2021-03-20 (×4): qty 1

## 2021-03-20 MED ORDER — NITROGLYCERIN 0.4 MG SL SUBL: 0.4000 mg | SUBLINGUAL_TABLET | SUBLINGUAL | Status: DC | PRN

## 2021-03-20 MED ORDER — METOCLOPRAMIDE HCL 5 MG PO TABS: 5.0000 mg | ORAL_TABLET | Freq: Three times a day (TID) | ORAL | Status: DC | PRN

## 2021-03-20 MED ORDER — HYDROCODONE-ACETAMINOPHEN 5-325 MG PO TABS
1.0000 | ORAL_TABLET | ORAL | Status: DC | PRN
  Administered 2021-03-21 – 2021-03-22 (×3): 1 via ORAL
  Filled 2021-03-20: qty 1
  Filled 2021-03-20: qty 2
  Filled 2021-03-20: qty 1

## 2021-03-20 MED ORDER — OXYCODONE HCL 5 MG/5ML PO SOLN: 5.0000 mg | Freq: Once | ORAL | Status: DC | PRN

## 2021-03-20 MED ORDER — ALBUMIN HUMAN 5 % IV SOLN
INTRAVENOUS | Status: AC
  Filled 2021-03-20: qty 250

## 2021-03-20 MED ORDER — SORBITOL 70 % SOLN
30.0000 mL | Freq: Every day | Status: DC | PRN
  Filled 2021-03-20: qty 30

## 2021-03-20 MED ORDER — PROPOFOL 10 MG/ML IV BOLUS
INTRAVENOUS | Status: AC
  Filled 2021-03-20: qty 20

## 2021-03-20 MED ORDER — PHENYLEPHRINE 40 MCG/ML (10ML) SYRINGE FOR IV PUSH (FOR BLOOD PRESSURE SUPPORT)
PREFILLED_SYRINGE | INTRAVENOUS | Status: AC
  Filled 2021-03-20: qty 10

## 2021-03-20 MED ORDER — OYSTER SHELL CALCIUM/D3 500-5 MG-MCG PO TABS
ORAL_TABLET | Freq: Every day | ORAL | Status: DC
  Filled 2021-03-20 (×2): qty 1

## 2021-03-20 MED ORDER — SODIUM CHLORIDE 0.9 % IR SOLN
Status: DC | PRN
  Administered 2021-03-20: 1000 mL

## 2021-03-20 MED ORDER — VANCOMYCIN HCL IN DEXTROSE 1-5 GM/200ML-% IV SOLN
INTRAVENOUS | Status: AC
  Filled 2021-03-20: qty 200

## 2021-03-20 MED ORDER — PANTOPRAZOLE SODIUM 40 MG PO TBEC
40.0000 mg | DELAYED_RELEASE_TABLET | Freq: Every day | ORAL | Status: DC
  Administered 2021-03-21 – 2021-03-22 (×2): 40 mg via ORAL
  Filled 2021-03-20 (×2): qty 1

## 2021-03-20 MED ORDER — SODIUM CHLORIDE 0.9 % IV SOLN: INTRAVENOUS | Status: DC

## 2021-03-20 MED ORDER — KETOROLAC TROMETHAMINE 30 MG/ML IJ SOLN
INTRAMUSCULAR | Status: AC
  Filled 2021-03-20: qty 1

## 2021-03-20 MED ORDER — FENTANYL CITRATE PF 50 MCG/ML IJ SOSY
PREFILLED_SYRINGE | INTRAMUSCULAR | Status: AC
  Filled 2021-03-20: qty 1

## 2021-03-20 MED ORDER — LIDOCAINE 2% (20 MG/ML) 5 ML SYRINGE
INTRAMUSCULAR | Status: DC | PRN
  Administered 2021-03-20: 60 mg via INTRAVENOUS

## 2021-03-20 MED ORDER — CEFAZOLIN SODIUM-DEXTROSE 2-4 GM/100ML-% IV SOLN
2.0000 g | INTRAVENOUS | Status: AC
  Administered 2021-03-20: 08:00:00 2 g via INTRAVENOUS
  Filled 2021-03-20: qty 100

## 2021-03-20 MED ORDER — ALBUMIN HUMAN 5 % IV SOLN: INTRAVENOUS | Status: DC | PRN

## 2021-03-20 MED ORDER — WATER FOR IRRIGATION, STERILE IR SOLN
Status: DC | PRN
  Administered 2021-03-20: 2000 mL

## 2021-03-20 MED ORDER — POLYETHYLENE GLYCOL 3350 17 G PO PACK: 17.0000 g | PACK | Freq: Every day | ORAL | Status: DC | PRN

## 2021-03-20 MED ORDER — BUPIVACAINE-EPINEPHRINE 0.25% -1:200000 IJ SOLN
INTRAMUSCULAR | Status: DC | PRN
  Administered 2021-03-20: 30 mL

## 2021-03-20 MED ORDER — METHOCARBAMOL 500 MG PO TABS
500.0000 mg | ORAL_TABLET | Freq: Four times a day (QID) | ORAL | Status: DC | PRN
  Administered 2021-03-20: 22:00:00 500 mg via ORAL
  Filled 2021-03-20: qty 1

## 2021-03-20 MED ORDER — PROPOFOL 500 MG/50ML IV EMUL
INTRAVENOUS | Status: AC
  Filled 2021-03-20: qty 50

## 2021-03-20 MED ORDER — EPHEDRINE SULFATE-NACL 50-0.9 MG/10ML-% IV SOSY
PREFILLED_SYRINGE | INTRAVENOUS | Status: DC | PRN
  Administered 2021-03-20: 10 mg via INTRAVENOUS
  Administered 2021-03-20: 15 mg via INTRAVENOUS

## 2021-03-20 MED ORDER — FENTANYL CITRATE (PF) 100 MCG/2ML IJ SOLN
INTRAMUSCULAR | Status: AC
  Filled 2021-03-20: qty 2

## 2021-03-20 MED ORDER — ONDANSETRON HCL 4 MG/2ML IJ SOLN
INTRAMUSCULAR | Status: DC | PRN
  Administered 2021-03-20: 4 mg via INTRAVENOUS

## 2021-03-20 MED ORDER — CEFAZOLIN SODIUM-DEXTROSE 2-4 GM/100ML-% IV SOLN
2.0000 g | Freq: Four times a day (QID) | INTRAVENOUS | Status: AC
  Administered 2021-03-20 (×2): 2 g via INTRAVENOUS
  Filled 2021-03-20 (×2): qty 100

## 2021-03-20 MED ORDER — BUPIVACAINE-EPINEPHRINE (PF) 0.25% -1:200000 IJ SOLN
INTRAMUSCULAR | Status: AC
  Filled 2021-03-20: qty 30

## 2021-03-20 MED ORDER — ONDANSETRON HCL 4 MG/2ML IJ SOLN
INTRAMUSCULAR | Status: AC
  Filled 2021-03-20: qty 4

## 2021-03-20 MED ORDER — HYDROCODONE-ACETAMINOPHEN 7.5-325 MG PO TABS: 1.0000 | ORAL_TABLET | ORAL | Status: DC | PRN

## 2021-03-20 MED ORDER — VANCOMYCIN HCL 1000 MG IV SOLR
INTRAVENOUS | Status: AC
  Filled 2021-03-20: qty 20

## 2021-03-20 MED ORDER — POVIDONE-IODINE 10 % EX SWAB: 2.0000 "application " | Freq: Once | CUTANEOUS | Status: DC

## 2021-03-20 MED ORDER — FIBER PO POWD: 1.0000 | Freq: Every day | ORAL | Status: DC

## 2021-03-20 MED ORDER — ONDANSETRON HCL 4 MG/2ML IJ SOLN: 4.0000 mg | Freq: Four times a day (QID) | INTRAMUSCULAR | Status: DC | PRN

## 2021-03-20 MED ORDER — FENTANYL CITRATE PF 50 MCG/ML IJ SOSY
25.0000 ug | PREFILLED_SYRINGE | INTRAMUSCULAR | Status: DC | PRN
  Administered 2021-03-20: 12:00:00 50 ug via INTRAVENOUS
  Administered 2021-03-20 (×2): 25 ug via INTRAVENOUS

## 2021-03-20 MED ORDER — KETOROLAC TROMETHAMINE 30 MG/ML IJ SOLN
INTRAMUSCULAR | Status: DC | PRN
  Administered 2021-03-20: 30 mg

## 2021-03-20 MED ORDER — SENNA 8.6 MG PO TABS
1.0000 | ORAL_TABLET | Freq: Two times a day (BID) | ORAL | Status: DC
  Administered 2021-03-20 – 2021-03-22 (×4): 8.6 mg via ORAL
  Filled 2021-03-20 (×4): qty 1

## 2021-03-20 MED ORDER — ISOPROPYL ALCOHOL 70 % SOLN
Status: DC | PRN
  Administered 2021-03-20: 1 via TOPICAL

## 2021-03-20 MED ORDER — EZETIMIBE 10 MG PO TABS
10.0000 mg | ORAL_TABLET | Freq: Every day | ORAL | Status: DC
  Administered 2021-03-20 – 2021-03-21 (×2): 10 mg via ORAL
  Filled 2021-03-20 (×2): qty 1

## 2021-03-20 MED ORDER — PHENOL 1.4 % MT LIQD: 1.0000 | OROMUCOSAL | Status: DC | PRN

## 2021-03-20 MED ORDER — PROPOFOL 10 MG/ML IV BOLUS
INTRAVENOUS | Status: DC | PRN
Start: 2021-03-20 — End: 2021-03-20
  Administered 2021-03-20: 75 ug/kg/min via INTRAVENOUS
  Administered 2021-03-20 (×2): 10 mg via INTRAVENOUS
  Administered 2021-03-20: 20 mg via INTRAVENOUS

## 2021-03-20 MED ORDER — ALPRAZOLAM 0.25 MG PO TABS
0.2500 mg | ORAL_TABLET | Freq: Three times a day (TID) | ORAL | Status: DC | PRN
  Administered 2021-03-21: 0.25 mg via ORAL
  Filled 2021-03-20: qty 1

## 2021-03-20 MED ORDER — MORPHINE SULFATE (PF) 2 MG/ML IV SOLN: 0.5000 mg | INTRAVENOUS | Status: DC | PRN

## 2021-03-20 MED ORDER — ONDANSETRON HCL 4 MG PO TABS: 4.0000 mg | ORAL_TABLET | Freq: Four times a day (QID) | ORAL | Status: DC | PRN

## 2021-03-20 MED ORDER — FUROSEMIDE 40 MG PO TABS
40.0000 mg | ORAL_TABLET | Freq: Every day | ORAL | Status: DC
  Administered 2021-03-21 – 2021-03-22 (×2): 40 mg via ORAL
  Filled 2021-03-20 (×3): qty 1

## 2021-03-20 MED ORDER — DEXAMETHASONE SODIUM PHOSPHATE 10 MG/ML IJ SOLN
INTRAMUSCULAR | Status: AC
  Filled 2021-03-20: qty 2

## 2021-03-20 MED ORDER — ACETAMINOPHEN 325 MG PO TABS
325.0000 mg | ORAL_TABLET | Freq: Four times a day (QID) | ORAL | Status: DC | PRN
  Administered 2021-03-22: 650 mg via ORAL
  Filled 2021-03-20: qty 2

## 2021-03-20 MED ORDER — MENTHOL 3 MG MT LOZG: 1.0000 | LOZENGE | OROMUCOSAL | Status: DC | PRN

## 2021-03-20 MED ORDER — METHOCARBAMOL 1000 MG/10ML IJ SOLN
500.0000 mg | Freq: Four times a day (QID) | INTRAVENOUS | Status: DC | PRN
  Filled 2021-03-20: qty 5

## 2021-03-20 MED ORDER — TRANEXAMIC ACID-NACL 1000-0.7 MG/100ML-% IV SOLN
1000.0000 mg | Freq: Once | INTRAVENOUS | Status: AC
  Administered 2021-03-20: 16:00:00 1000 mg via INTRAVENOUS
  Filled 2021-03-20: qty 100

## 2021-03-20 MED ORDER — KETOTIFEN FUMARATE 0.025 % OP SOLN: 1.0000 [drp] | Freq: Two times a day (BID) | OPHTHALMIC | Status: DC | PRN

## 2021-03-20 MED ORDER — DEXAMETHASONE SODIUM PHOSPHATE 10 MG/ML IJ SOLN
10.0000 mg | Freq: Once | INTRAMUSCULAR | Status: AC
Start: 2021-03-21 — End: 2021-03-21
  Administered 2021-03-21: 10 mg via INTRAVENOUS
  Filled 2021-03-20: qty 1

## 2021-03-20 MED ORDER — ACETAMINOPHEN 500 MG PO TABS: 1000.0000 mg | ORAL_TABLET | Freq: Once | ORAL | Status: DC

## 2021-03-20 MED ORDER — 0.9 % SODIUM CHLORIDE (POUR BTL) OPTIME
TOPICAL | Status: DC | PRN
  Administered 2021-03-20: 08:00:00 1000 mL

## 2021-03-20 MED ORDER — PHENYLEPHRINE HCL-NACL 20-0.9 MG/250ML-% IV SOLN
INTRAVENOUS | Status: DC | PRN
  Administered 2021-03-20: 40 ug/min via INTRAVENOUS

## 2021-03-20 MED ORDER — PROPOFOL 1000 MG/100ML IV EMUL
INTRAVENOUS | Status: AC
  Filled 2021-03-20: qty 100

## 2021-03-20 MED ORDER — LACTATED RINGERS IV SOLN: INTRAVENOUS | Status: DC

## 2021-03-20 MED ORDER — TRANEXAMIC ACID-NACL 1000-0.7 MG/100ML-% IV SOLN
1000.0000 mg | INTRAVENOUS | Status: AC
  Administered 2021-03-20: 08:00:00 1000 mg via INTRAVENOUS
  Filled 2021-03-20: qty 100

## 2021-03-20 MED ORDER — ACETAMINOPHEN 10 MG/ML IV SOLN
1000.0000 mg | Freq: Once | INTRAVENOUS | Status: AC
  Administered 2021-03-20: 08:00:00 1000 mg via INTRAVENOUS
  Filled 2021-03-20: qty 100

## 2021-03-20 MED ORDER — CHLORHEXIDINE GLUCONATE 0.12 % MT SOLN: 15.0000 mL | Freq: Once | OROMUCOSAL | Status: DC

## 2021-03-20 MED ORDER — PHENYLEPHRINE 40 MCG/ML (10ML) SYRINGE FOR IV PUSH (FOR BLOOD PRESSURE SUPPORT)
PREFILLED_SYRINGE | INTRAVENOUS | Status: DC | PRN
  Administered 2021-03-20 (×5): 40 ug via INTRAVENOUS

## 2021-03-20 MED ORDER — ASPIRIN 81 MG PO CHEW
81.0000 mg | CHEWABLE_TABLET | Freq: Two times a day (BID) | ORAL | Status: DC
  Administered 2021-03-20 – 2021-03-22 (×4): 81 mg via ORAL
  Filled 2021-03-20 (×4): qty 1

## 2021-03-20 MED ORDER — METOCLOPRAMIDE HCL 5 MG/ML IJ SOLN: 5.0000 mg | Freq: Three times a day (TID) | INTRAMUSCULAR | Status: DC | PRN

## 2021-03-20 MED ORDER — SODIUM CHLORIDE (PF) 0.9 % IJ SOLN
INTRAMUSCULAR | Status: DC | PRN
  Administered 2021-03-20: 30 mL

## 2021-03-20 MED ORDER — LIDOCAINE HCL (PF) 2 % IJ SOLN
INTRAMUSCULAR | Status: AC
  Filled 2021-03-20: qty 10

## 2021-03-20 MED ORDER — BUPIVACAINE IN DEXTROSE 0.75-8.25 % IT SOLN
INTRATHECAL | Status: DC | PRN
Start: 2021-03-20 — End: 2021-03-20
  Administered 2021-03-20: 2 mL via INTRATHECAL

## 2021-03-20 MED ORDER — OXYCODONE HCL 5 MG PO TABS: 5.0000 mg | ORAL_TABLET | Freq: Once | ORAL | Status: DC | PRN

## 2021-03-20 MED ORDER — ATORVASTATIN CALCIUM 40 MG PO TABS
40.0000 mg | ORAL_TABLET | Freq: Every day | ORAL | Status: DC
  Administered 2021-03-20 – 2021-03-21 (×2): 40 mg via ORAL
  Filled 2021-03-20 (×2): qty 1

## 2021-03-20 MED ORDER — SODIUM CHLORIDE (PF) 0.9 % IJ SOLN
INTRAMUSCULAR | Status: AC
  Filled 2021-03-20: qty 30

## 2021-03-20 MED ORDER — VITAMIN D 25 MCG (1000 UNIT) PO TABS
1000.0000 [IU] | ORAL_TABLET | Freq: Every day | ORAL | Status: DC
  Administered 2021-03-21 – 2021-03-22 (×2): 1000 [IU] via ORAL
  Filled 2021-03-20 (×2): qty 1

## 2021-03-20 MED ORDER — ALUM & MAG HYDROXIDE-SIMETH 200-200-20 MG/5ML PO SUSP: 30.0000 mL | ORAL | Status: DC | PRN

## 2021-03-20 MED ORDER — NUTRISOURCE FIBER PO PACK
1.0000 | PACK | Freq: Every day | ORAL | Status: DC
  Administered 2021-03-21 – 2021-03-22 (×2): 1 via ORAL
  Filled 2021-03-20 (×2): qty 1

## 2021-03-20 MED ORDER — FENTANYL CITRATE (PF) 100 MCG/2ML IJ SOLN
INTRAMUSCULAR | Status: DC | PRN
Start: 2021-03-20 — End: 2021-03-20
  Administered 2021-03-20: 25 ug via INTRAVENOUS

## 2021-03-20 MED ORDER — METOPROLOL SUCCINATE ER 50 MG PO TB24
50.0000 mg | ORAL_TABLET | Freq: Every day | ORAL | Status: DC
  Administered 2021-03-21 – 2021-03-22 (×2): 50 mg via ORAL
  Filled 2021-03-20 (×2): qty 1

## 2021-03-20 MED ORDER — EPHEDRINE 5 MG/ML INJ
INTRAVENOUS | Status: AC
  Filled 2021-03-20: qty 5

## 2021-03-20 MED ORDER — ADULT MULTIVITAMIN W/MINERALS CH
1.0000 | ORAL_TABLET | Freq: Every day | ORAL | Status: DC
  Administered 2021-03-21 – 2021-03-22 (×2): 1 via ORAL
  Filled 2021-03-20 (×2): qty 1

## 2021-03-20 MED ORDER — DIPHENHYDRAMINE HCL 12.5 MG/5ML PO ELIX: 12.5000 mg | ORAL_SOLUTION | ORAL | Status: DC | PRN

## 2021-03-20 MED ORDER — LEVOTHYROXINE SODIUM 75 MCG PO TABS
75.0000 ug | ORAL_TABLET | Freq: Every day | ORAL | Status: DC
  Administered 2021-03-21 – 2021-03-22 (×2): 75 ug via ORAL
  Filled 2021-03-20 (×2): qty 1

## 2021-03-20 MED ORDER — ORAL CARE MOUTH RINSE: 15.0000 mL | Freq: Once | OROMUCOSAL | Status: DC

## 2021-03-20 SURGICAL SUPPLY — 82 items
ACE SHELL 3H 52 E HIP (Shell) ×2 IMPLANT
ADH SKN CLS APL DERMABOND .7 (GAUZE/BANDAGES/DRESSINGS) ×2
APL PRP STRL LF DISP 70% ISPRP (MISCELLANEOUS) ×1
BAG COUNTER SPONGE SURGICOUNT (BAG) IMPLANT
BAG DECANTER FOR FLEXI CONT (MISCELLANEOUS) IMPLANT
BAG SPEC THK2 15X12 ZIP CLS (MISCELLANEOUS)
BAG SPNG CNTER NS LX DISP (BAG)
BAG ZIPLOCK 12X15 (MISCELLANEOUS) IMPLANT
BIT DRILL RINGLOC 3.2MMX20 (BIT) IMPLANT
BIT DRILL RINGLOC 3.2MMX40 (BIT) IMPLANT
BIT DRILL RINGLOC 3.2X20 (BIT) ×1
BIT DRILL RINGLOC 3.2X40 (BIT) ×1
BIT DRILL RINGLOC QUICK CONN (BIT) IMPLANT
BRNG HIP E 42X28 LUM STRL (Insert) ×1 IMPLANT
CHLORAPREP W/TINT 26 (MISCELLANEOUS) ×2 IMPLANT
COVER PERINEAL POST (MISCELLANEOUS) ×2 IMPLANT
COVER SURGICAL LIGHT HANDLE (MISCELLANEOUS) ×2 IMPLANT
DECANTER SPIKE VIAL GLASS SM (MISCELLANEOUS) ×2 IMPLANT
DERMABOND ADVANCED (GAUZE/BANDAGES/DRESSINGS) ×2
DERMABOND ADVANCED .7 DNX12 (GAUZE/BANDAGES/DRESSINGS) ×2 IMPLANT
DRAPE IMP U-DRAPE 54X76 (DRAPES) ×2 IMPLANT
DRAPE SHEET LG 3/4 BI-LAMINATE (DRAPES) ×6 IMPLANT
DRAPE STERI IOBAN 125X83 (DRAPES) ×2 IMPLANT
DRAPE U-SHAPE 47X51 STRL (DRAPES) ×4 IMPLANT
DRILL BIT RINGLOC 3.2MMX20 (BIT) ×2
DRILL BIT RINGLOC 3.2MMX40 (BIT) ×2
DRILL BIT RINGLOC QUICK CONN (BIT) ×2
DRSG AQUACEL AG ADV 3.5X10 (GAUZE/BANDAGES/DRESSINGS) ×2 IMPLANT
DRSG DERMACEA 8X12 NADH (GAUZE/BANDAGES/DRESSINGS) ×1 IMPLANT
ELECT REM PT RETURN 15FT ADLT (MISCELLANEOUS) ×2 IMPLANT
GAUZE SPONGE 4X4 12PLY STRL (GAUZE/BANDAGES/DRESSINGS) ×2 IMPLANT
GLOVE SRG 8 PF TXTR STRL LF DI (GLOVE) ×1 IMPLANT
GLOVE SURG ENC MOIS LTX SZ8.5 (GLOVE) ×4 IMPLANT
GLOVE SURG ENC TEXT LTX SZ7.5 (GLOVE) ×4 IMPLANT
GLOVE SURG UNDER POLY LF SZ8 (GLOVE) ×2
GLOVE SURG UNDER POLY LF SZ8.5 (GLOVE) ×2 IMPLANT
GOWN SPEC L3 XXLG W/TWL (GOWN DISPOSABLE) ×2 IMPLANT
GOWN STRL REUS W/TWL XL LVL3 (GOWN DISPOSABLE) ×2 IMPLANT
HANDPIECE INTERPULSE COAX TIP (DISPOSABLE) ×2
HEAD OSTEO CTAPER L FIT (Orthopedic Implant) ×4 IMPLANT
HEAD OSTEO CTAPER L FIT 28 +10 (Orthopedic Implant) IMPLANT
HEAD OSTEO CTAPER L FIT 36+10 (Orthopedic Implant) IMPLANT
HOLDER FOLEY CATH W/STRAP (MISCELLANEOUS) ×2 IMPLANT
HOOD PEEL AWAY FLYTE STAYCOOL (MISCELLANEOUS) ×6 IMPLANT
INSERT TIB BEARING E 28X42 (Insert) ×1 IMPLANT
JET LAVAGE IRRISEPT WOUND (IRRIGATION / IRRIGATOR) ×2
KIT TURNOVER KIT A (KITS) IMPLANT
LAVAGE JET IRRISEPT WOUND (IRRIGATION / IRRIGATOR) ×1 IMPLANT
LINER DUAL MOBIL G7 42 E (Liner) IMPLANT
LINER DUAL MOBILITY G7 42  E (Liner) ×2 IMPLANT
LINER DUAL MOBILITY G7 42 E (Liner) ×1 IMPLANT
LINER G7 VIT E +5 36 D (Liner) ×1 IMPLANT
MANIFOLD NEPTUNE II (INSTRUMENTS) ×2 IMPLANT
MARKER SKIN DUAL TIP RULER LAB (MISCELLANEOUS) ×2 IMPLANT
NDL SAFETY ECLIPSE 18X1.5 (NEEDLE) ×1 IMPLANT
NDL SPNL 18GX3.5 QUINCKE PK (NEEDLE) ×1 IMPLANT
NEEDLE HYPO 18GX1.5 SHARP (NEEDLE) ×2
NEEDLE SPNL 18GX3.5 QUINCKE PK (NEEDLE) ×2 IMPLANT
PACK ANTERIOR HIP CUSTOM (KITS) ×2 IMPLANT
PENCIL SMOKE EVACUATOR (MISCELLANEOUS) IMPLANT
SAW OSC TIP CART 19.5X105X1.3 (SAW) ×2 IMPLANT
SCREW BONE 6.5X15 (Screw) ×1 IMPLANT
SCREW BONE 6.5X20 (Screw) IMPLANT
SCREW BONE 6.5X35 SELF TAP (Screw) ×1 IMPLANT
SCREW BONE SELF TAP 6.5X40 (Screw) ×1 IMPLANT
SEALER BIPOLAR AQUA 6.0 (INSTRUMENTS) ×2 IMPLANT
SET HNDPC FAN SPRY TIP SCT (DISPOSABLE) ×1 IMPLANT
SHELL ACET G7 3H 50 SZD (Shell) ×1 IMPLANT
SHELL ACETAB 3H 52 E HIP (Shell) IMPLANT
SPONGE T-LAP 18X18 ~~LOC~~+RFID (SPONGE) ×6 IMPLANT
STAPLER INSORB 30 2030 C-SECTI (MISCELLANEOUS) IMPLANT
SUT MNCRL AB 3-0 PS2 18 (SUTURE) ×2 IMPLANT
SUT MON AB 2-0 CT1 36 (SUTURE) ×2 IMPLANT
SUT STRATAFIX PDO 1 14 VIOLET (SUTURE) ×2
SUT STRATFX PDO 1 14 VIOLET (SUTURE) ×1
SUT VIC AB 2-0 CT1 27 (SUTURE) ×2
SUT VIC AB 2-0 CT1 TAPERPNT 27 (SUTURE) IMPLANT
SUTURE STRATFX PDO 1 14 VIOLET (SUTURE) ×1 IMPLANT
SYR 3ML LL SCALE MARK (SYRINGE) ×2 IMPLANT
TRAY FOLEY MTR SLVR 16FR STAT (SET/KITS/TRAYS/PACK) IMPLANT
TUBE SUCTION HIGH CAP CLEAR NV (SUCTIONS) ×2 IMPLANT
WATER STERILE IRR 1000ML POUR (IV SOLUTION) ×2 IMPLANT

## 2021-03-20 NOTE — Transfer of Care (Signed)
Immediate Anesthesia Transfer of Care Note  Patient: Brittany Santiago  Procedure(s) Performed: Procedure(s): Conversion of previous hemiarthroplasty  to right total hip arthroplasty (Right)  Patient Location: PACU  Anesthesia Type:Spinal  Level of Consciousness:  sedated, patient cooperative and responds to stimulation  Airway & Oxygen Therapy:Patient Spontanous Breathing and Patient connected to face mask oxgen  Post-op Assessment:  Report given to PACU RN and Post -op Vital signs reviewed and stable  Post vital signs:  Reviewed and stable  Last Vitals:  Vitals:   03/20/21 0530  BP: (!) 181/65  Pulse: 75  Resp: 16  Temp: 36.9 C  SpO2: 55%    Complications: No apparent anesthesia complications

## 2021-03-20 NOTE — Anesthesia Postprocedure Evaluation (Signed)
Anesthesia Post Note  Patient: Brittany Santiago  Procedure(s) Performed: Conversion of previous hemiarthroplasty  to right total hip arthroplasty (Right: Hip)     Patient location during evaluation: PACU Anesthesia Type: Spinal Level of consciousness: awake and alert and oriented Pain management: pain level controlled Vital Signs Assessment: post-procedure vital signs reviewed and stable Respiratory status: spontaneous breathing, nonlabored ventilation and respiratory function stable Cardiovascular status: blood pressure returned to baseline Postop Assessment: no apparent nausea or vomiting, spinal receding, no headache and no backache Anesthetic complications: no   No notable events documented.  Last Vitals:  Vitals:   03/20/21 1145 03/20/21 1200  BP: (!) 103/54 120/71  Pulse: 67 72  Resp: 16 13  Temp:    SpO2: 100% 100%    Last Pain:  Vitals:   03/20/21 1200  TempSrc:   PainSc: [place]

## 2021-03-20 NOTE — Anesthesia Procedure Notes (Signed)
Spinal  Patient location during procedure: OR Start time: 03/20/2021 7:29 AM End time: 03/20/2021 7:32 AM Reason for block: surgical anesthesia Staffing Performed: anesthesiologist  Anesthesiologist: Brennan Bailey, MD Preanesthetic Checklist Completed: patient identified, IV checked, risks and benefits discussed, surgical consent, monitors and equipment checked, pre-op evaluation and timeout performed Spinal Block Patient position: sitting Prep: DuraPrep and site prepped and draped Patient monitoring: continuous pulse ox, blood pressure and heart rate Approach: midline Location: L3-4 Injection technique: single-shot Needle Needle type: Pencan  Needle gauge: 24 G Needle length: 9 cm Assessment Events: CSF return Additional Notes Risks, benefits, and alternative discussed. Patient gave consent to procedure. Prepped and draped in sitting position. Patient sedated but responsive to voice. Clear CSF obtained after one needle pass. Positive terminal aspiration. No pain or paraesthesias with injection. Patient tolerated procedure well. Vital signs stable. Tawny Asal, MD

## 2021-03-20 NOTE — Op Note (Signed)
OPERATIVE REPORT   03/20/2021  11:15 AM  PATIENT:  [name]   SURGEON:  Bertram Savin, MD  ASSISTANT:  Nehemiah Massed, PA-C.   PREOPERATIVE DIAGNOSIS:  Failed right hip hemiarthroplasty.  POSTOPERATIVE DIAGNOSIS:  Same.  PROCEDURE:  Conversion of previous hemiarthroplasty to right total hip arthroplasty, anterior approach.  ESTIMATED BLOOD LOSS:-800 mL   ANESTHESIA:   Spinal plus MAC.  ANTIBIOTICS: 2 g Ancef. 1 g vancomycin (high risk procedure).  IMPLANTS: Biomet G7 [place]o Ti acetabular shell, 52 mm, E. 6.5 mm cancellous bone screw x3. Dual mobility acetabular liner, neutral, size 42 mm, D. Biomet dual mobility construct with Vivacit-E polybearing size 28 x 42 mm and Stryker 28+10 mm metal head ball, C taper.  EXPLANTS: Osteonics bipolar construct 42 mm outer bearing and 28+10 mm metal head ball, C taper.  SPECIMENS: None.  COMPLICATIONS: None.  DISPOSITION: Stable to PACU.  SURGICAL INDICATIONS:  Brittany Santiago is a 88 y.o. female with a diagnosis of failed right hip hemiarthroplasty.  She originally underwent posterior approach right hip hemiarthroplasty with cemented Osteonics stem at an outside facility in 2009.  She did well for quite some time, and then she developed end-stage osteoarthritis of the right hip with medial wear of the bearing surface.  She had disabling pain with weightbearing activity.  She had a diagnostic intra-articular local anesthetic injection, which gave her excellent relief for couple of hours.  I then saw her in the office as a second opinion, and she was indicated for conversion to total hip arthroplasty.  The risks, benefits, and alternatives were discussed with the patient. There are risks associated with the surgery including, but not limited to, problems with anesthesia (death), infection, instability (giving out of the joint), dislocation, differences in leg length/angulation/rotation, fracture of bones, loosening or failure of  implants, hematoma (blood accumulation) which may require surgical drainage, blood clots, pulmonary embolism, nerve injury (foot drop and lateral thigh numbness), and blood vessel injury. The patient understands these risks and elects to proceed.  PROCEDURE IN DETAIL: The patient was identified in the holding area using 2 identifiers.  The surgical site was marked by myself.  She was taken to the operating room, and spinal anesthesia was obtained on the stretcher.  She was then placed supine, and a Foley catheter was inserted.  She was then transferred to the [facility] table.  All bony prominences were well-padded.  The right hip was prepped and draped in the normal sterile surgical fashion.  Timeout was called, verifying side and site of surgery.  Preop antibiotics were given within 60 minutes of beginning the procedure.  Bikini incision was created 3 fingerbreadths distal to the ASIS, and superficial dissection was performed only lateral to the ASIS.  Anterior approach to the hip was performed through the Heuter interval.  Lateral femoral circumflex vessels were treated with the Aquamantys.  The anterior capsule was exposed and incised with an inverted T capsulotomy.  Joint fluid was encountered and evacuated.  No purulence or evidence of infection.  Traction was applied to the lower extremity.  Using a bone tamp, I was able to remove the bipolar construct from the trunnion.  The bipolar bearing was then removed and passed to the back table.  Pubofemoral ligament was released subperiosteally to the level of the lesser trochanter.  External rotation was applied, and femoral retractors were placed.  Superior hip capsule was excised.  The trunnion was inspected and found to be free of damage. The stem  was stable.  The leg spar was then flexed, and acetabular retractors were placed.  The trunnion was retracted posteriorly. Scar tissue was removed from the periacetabular area.  I then sequentially reamed up to a 51  mm reamer, taking care to not medialize or raise her center of rotation any further.  A 52 mm cup was opened and impacted into place with approximately 45 degrees of abduction and 20 degrees of anteversion.  I achieved adequate press-fit fixation.  I elected to augment the fixation with a total of 3 cancellous bone screws.  The real metal liner was impacted into place.  Trial dual mobility construct utilizing a Stryker 28+5 mm head ball was assembled and placed onto the trunnion.  The hip was reduced.  X-ray was used to confirm restoration of offset and leg length.  The hip was then dislocated, and the trial bipolar construct was removed.  The real bipolar construct was assembled on the back table.  The trunnion was cleaned with a lap sponge, and the bipolar construct was impacted onto the trunnion.  The hip was reduced.  Soft tissue tension was adequate.  In 90 degrees of external rotation and extension, the hip was stable to an anterior directed force with a bone hook.  Fluoroscopy was used to confirm component position and leg lengths. Repeat stability testing was performed and found to be unchanged.  The wound was copiously irrigated with Irrisept solution followed by normal saline with pulse lavage.  Marcaine solution was injected into the periarticular tissues.  Meticulous hemostasis was achieved with Bovie electrocautery and the Aquamantys.  The fascia was repaired with #1 strata fix suture.  Deep fatty tissue was reapproximated with 2-0 Vicryl suture.  Deep dermal layer was closed with 2-0 interrupted Monocryl suture.  The skin was reapproximated with 3-0 running Monocryl subcuticular stitch.  Dermabond was applied to the skin.  Once the glue was fully dried, an Aquacel Ag dressing was applied.  The patient was then awakened from anesthesia, transferred to her bed, and taken to the PACU in stable condition.  Sponge, needle, and instrument counts were correct at the end of the case x2.  There were no  known complications.  Please note that a surgical assistant was a medical necessity for this procedure to perform it in a safe and expeditious manner. Assistant was necessary to provide appropriate retraction of vital neurovascular structures, to prevent femoral fracture, and to allow for anatomic placement of the prosthesis.   POSTOPERATIVE PLAN: Postoperatively, the patient will be admitted to the hospital.  50% weightbearing right lower extremity with a walker.  Mobilize out of bed with PT/OT.  Begin aspirin 81 mg p.o. twice daily for DVT prophylaxis.  Routine IV antibiotics.  She will undergo disposition planning.  Return to the office in 2 weeks for routine postoperative care.

## 2021-03-20 NOTE — Evaluation (Signed)
Physical Therapy Evaluation Patient Details Name: Brittany Santiago MRN: [id number] DOB: Jun 20, 1930 Today's Date: 03/20/2021  History of Present Illness  88 yo female s/p conversion of previous hemiarthroplasty to R DA-THA on 1/25. PMH includes HTN, HLD, endometrial cancer, CAD with history of CABG, asthma, LBBB, R THA.  Clinical Impression   Pt presents with generalized weakness, R hip post-op pain, decreased knowledge and application of posterior hip and WB precautions, antalgic gait, and decreased activity tolerance vs baseline. Pt to benefit from acute PT to address deficits. Pt ambulated 25 ft with RW and close guard for safety, pt fatiguing after 25 ft requiring rest. Pt's family at bedside, supportive and pt/family state pt can have up to 24/7 assist at home if needed. PT to progress mobility as tolerated, and will continue to follow acutely.         Recommendations for follow up therapy are one component of a multi-disciplinary discharge planning process, led by the attending physician.  Recommendations may be updated based on patient status, additional functional criteria and insurance authorization.  Follow Up Recommendations Follow physician's recommendations for discharge plan and follow up therapies    Assistance Recommended at Discharge Intermittent Supervision/Assistance  Patient can return home with the following  A little help with walking and/or transfers;A little help with bathing/dressing/bathroom;Help with stairs or ramp for entrance;Assist for transportation    Equipment Recommendations Rolling walker (2 wheels)  Recommendations for Other Services       Functional Status Assessment Patient has had a recent decline in their functional status and demonstrates the ability to make significant improvements in function in a reasonable and predictable amount of time.     Precautions / Restrictions Precautions Precautions: Posterior Hip;Fall Precaution Booklet Issued: Yes  (comment) Precaution Comments: no hip flexion >90 degrees, no hip IR past neutral, no hip adduction past midline/no crossing legs reviewed verbally, during mobility, and handout administered Restrictions Weight Bearing Restrictions: Yes RLE Weight Bearing: Partial weight bearing RLE Partial Weight Bearing Percentage or Pounds: 50%      Mobility  Bed Mobility Overal bed mobility: Needs Assistance Bed Mobility: Supine to Sit     Supine to sit: Min assist, HOB elevated     General bed mobility comments: assist for LE translation to EOB, trunk elevation, cues for avoiding hip flexion >90 degrees when scooting to EOB. Also encouraged pt to exit bed out of R side to maintain precautions easily    Transfers Overall transfer level: Needs assistance Equipment used: Rolling walker (2 wheels) Transfers: Sit to/from Stand Sit to Stand: Min guard           General transfer comment: close guard for safety, cues for safe hand placement and keeping trunk upright during rise to prevent hip flexion >90 deg    Ambulation/Gait Ambulation/Gait assistance: Min guard Gait Distance (Feet): 25 Feet Assistive device: Rolling walker (2 wheels) Gait Pattern/deviations: Trunk flexed, Step-to pattern, Decreased weight shift to right, Decreased step length - right Gait velocity: decr     General Gait Details: close guard for safety, x1 instance of steadying with directional change. Verbal cuing for upright posture, sequencing (R foot, push through UEs through RW, L foot meets R foot), and 50% WB through RLE.  Stairs            Wheelchair Mobility    Modified Rankin (Stroke Patients Only)       Balance Overall balance assessment: Needs assistance Sitting-balance support: No upper extremity supported, Feet supported Sitting balance-Leahy Scale:  Good     Standing balance support: Bilateral upper extremity supported, During functional activity, Reliant on assistive device for  balance Standing balance-Leahy Scale: Poor                               Pertinent Vitals/Pain Pain Assessment Pain Assessment: Faces Faces Pain Scale: Hurts a little bit Pain Location: R hip, incisional Pain Descriptors / Indicators: Sore, Discomfort, Grimacing Pain Intervention(s): Limited activity within patient's tolerance, Monitored during session, Repositioned    Home Living Family/patient expects to be discharged to:: Private residence Living Arrangements: Alone Available Help at Discharge: Family;Available 24 hours/day (daughter lives in town, can stay with pt 24/7 initially if needed) Type of Home: House Home Access: Stairs to enter   [employer] of Steps: 1 Alternate Level Stairs-Number of Steps: stair lift for 13 steps to enter home Home Layout: Two level Home Equipment: Cane - single point;BSC/3in1;Shower seat      Prior Function Prior Level of Function : Independent/Modified Independent             Mobility Comments: pt reports she was "very independent" PTA       Hand Dominance   Dominant Hand: Right    Extremity/Trunk Assessment   Upper Extremity Assessment Upper Extremity Assessment: Defer to OT evaluation    Lower Extremity Assessment Lower Extremity Assessment: Generalized weakness;RLE deficits/detail RLE Deficits / Details: anticipated post-operative weakness; able to perform ankle pumps, quad set, heel slide to ~40 deg knee flexion, glute set RLE Sensation:  (min foot numbness due to spinal, wearing off)    Cervical / Trunk Assessment Cervical / Trunk Assessment: Normal  Communication   Communication: No difficulties  Cognition Arousal/Alertness: Awake/alert Behavior During Therapy: WFL for tasks assessed/performed Overall Cognitive Status: Within Functional Limits for tasks assessed                                          General Comments      Exercises     Assessment/Plan    PT  Assessment Patient needs continued PT services  PT Problem List Decreased strength;Decreased mobility;Pain       PT Treatment Interventions DME instruction;Therapeutic activities;Gait training;Therapeutic exercise;Patient/family education;Balance training;Stair training;Neuromuscular re-education;Functional mobility training    PT Goals (Current goals can be found in the Care Plan section)  Acute Rehab PT Goals Patient Stated Goal: home PT Goal Formulation: With patient Time For Goal Achievement: 03/27/21 Potential to Achieve Goals: Good    Frequency 7X/week     Co-evaluation               AM-PAC PT "6 Clicks" Mobility  Outcome Measure Help needed turning from your back to your side while in a flat bed without using bedrails?: A Little Help needed moving from lying on your back to sitting on the side of a flat bed without using bedrails?: A Little Help needed moving to and from a bed to a chair (including a wheelchair)?: A Little Help needed standing up from a chair using your arms (e.g., wheelchair or bedside chair)?: A Little Help needed to walk in hospital room?: A Little Help needed climbing 3-5 steps with a railing? : A Lot 6 Click Score: 17    End of Session   Activity Tolerance: Patient tolerated treatment well Patient left: in chair;with call bell/phone within  reach;with family/visitor present;Other (comment) (pt verbalizes she will not get up without staff assist, call bell in lap, RN notified there is no posey box in room) Nurse Communication: Mobility status PT Visit Diagnosis: Other abnormalities of gait and mobility (R26.89);Muscle weakness (generalized) (M62.81)    Time: 8483-5075 PT Time Calculation (min) (ACUTE ONLY): 31 min   Charges:   PT Evaluation $PT Eval Low Complexity: 1 Low PT Treatments $Gait Training: 8-22 mins        Stacie Glaze, PT DPT Acute Rehabilitation Services Pager [phone]  Office [phone]   Louis Matte 03/20/2021, 6:38  PM

## 2021-03-20 NOTE — Discharge Instructions (Addendum)
Dr. Rod Can Joint Replacement Specialist [facility] [address] [place], [place] [address] [phone]   TOTAL HIP REPLACEMENT POSTOPERATIVE DIRECTIONS    Hip Rehabilitation, Guidelines Following Surgery   WEIGHT BEARING Partial weight bearing with assist device as directed.  50% WB RLE with walker.  The results of a hip operation are greatly improved after range of motion and muscle strengthening exercises. Follow all safety measures which are given to protect your hip. If any of these exercises cause increased pain or swelling in your joint, decrease the amount until you are comfortable again. Then slowly increase the exercises. Call your caregiver if you have problems or questions.   HOME CARE INSTRUCTIONS  Most of the following instructions are designed to prevent the dislocation of your new hip.  Remove items at home which could result in a fall. This includes throw rugs or furniture in walking pathways.  Continue medications as instructed at time of discharge. You may have some home medications which will be placed on hold until you complete the course of blood thinner medication. You may start showering once you are discharged home. Do not remove your dressing. Do not put on socks or shoes without following the instructions of your caregivers.   Sit on chairs with arms. Use the chair arms to help push yourself up when arising.  Arrange for the use of a toilet seat elevator so you are not sitting low.  Walk with walker as instructed.  You may resume a sexual relationship in one month or when given the OK by your caregiver.  Use walker as long as suggested by your caregivers.  You may put full weight on your legs and walk as much as is comfortable. Avoid periods of inactivity such as sitting longer than an hour when not asleep. This helps prevent blood clots.  You may return to work once you are cleared by Engineer, production.  Do not drive a car  for 6 weeks or until released by your surgeon.  Do not drive while taking narcotics.  Wear elastic stockings for two weeks following surgery during the day but you may remove then at night.  Make sure you keep all of your appointments after your operation with all of your doctors and caregivers. You should call the office at the above phone number and make an appointment for approximately two weeks after the date of your surgery. Please pick up a stool softener and laxative for home use as long as you are requiring pain medications. ICE to the affected hip every three hours for 30 minutes at a time and then as needed for pain and swelling. Continue to use ice on the hip for pain and swelling from surgery. You may notice swelling that will progress down to the foot and ankle.  This is normal after surgery.  Elevate the leg when you are not up walking on it.   It is important for you to complete the blood thinner medication as prescribed by your doctor. Continue to use the breathing machine which will help keep your temperature down.  It is common for your temperature to cycle up and down following surgery, especially at night when you are not up moving around and exerting yourself.  The breathing machine keeps your lungs expanded and your temperature down.  RANGE OF MOTION AND STRENGTHENING EXERCISES  These exercises are designed to help you keep full movement of your hip joint. Follow your caregiver's or physical therapist's instructions.  Perform all exercises about fifteen times, three times per day or as directed. Exercise both hips, even if you have had only one joint replacement. These exercises can be done on a training (exercise) mat, on the floor, on a table or on a bed. Use whatever works the best and is most comfortable for you. Use music or television while you are exercising so that the exercises are a pleasant break in your day. This will make your life better with the exercises acting as a  break in routine you can look forward to.  Lying on your back, slowly slide your foot toward your buttocks, raising your knee up off the floor. Then slowly slide your foot back down until your leg is straight again.  Lying on your back spread your legs as far apart as you can without causing discomfort.  Lying on your side, raise your upper leg and foot straight up from the floor as far as is comfortable. Slowly lower the leg and repeat.  Lying on your back, tighten up the muscle in the front of your thigh (quadriceps muscles). You can do this by keeping your leg straight and trying to raise your heel off the floor. This helps strengthen the largest muscle supporting your knee.  Lying on your back, tighten up the muscles of your buttocks both with the legs straight and with the knee bent at a comfortable angle while keeping your heel on the floor.   SKILLED REHAB INSTRUCTIONS: If the patient is transferred to a skilled rehab facility following release from the hospital, a list of the current medications will be sent to the facility for the patient to continue.  When discharged from the skilled rehab facility, please have the facility set up the patient's [place] prior to being released. Also, the skilled facility will be responsible for providing the patient with their medications at time of release from the facility to include their pain medication and their blood thinner medication. If the patient is still at the rehab facility at time of the two week follow up appointment, the skilled rehab facility will also need to assist the patient in arranging follow up appointment in our office and any transportation needs.  POST-OPERATIVE OPIOID TAPER INSTRUCTIONS: It is important to wean off of your opioid medication as soon as possible. If you do not need pain medication after your surgery it is ok to stop day one. Opioids include: Codeine, Hydrocodone(Norco, Vicodin),  Oxycodone(Percocet, oxycontin) and hydromorphone amongst others.  Long term and even short term use of opiods can cause: Increased pain response Dependence Constipation Depression Respiratory depression And more.  Withdrawal symptoms can include Flu like symptoms Nausea, vomiting And more Techniques to manage these symptoms Hydrate well Eat regular healthy meals Stay active Use relaxation techniques(deep breathing, meditating, yoga) Do Not substitute Alcohol to help with tapering If you have been on opioids for less than two weeks and do not have pain than it is ok to stop all together.  Plan to wean off of opioids This plan should start within one week post op of your joint replacement. Maintain the same interval or time between taking each dose and first decrease the dose.  Cut the total daily intake of opioids by one tablet each day Next start to increase the time between doses. The last dose that should be eliminated is the evening dose.    MAKE SURE YOU:  Understand these instructions.  Will watch your condition.  Will get  help right away if you are not doing well or get worse.  Pick up stool softner and laxative for home use following surgery while on pain medications. Do not remove your dressing. The dressing is waterproof--it is OK to take showers. Continue to use ice for pain and swelling after surgery. Do not use any lotions or creams on the incision until instructed by your surgeon. Total Hip Protocol. Please contact your back doctor to arrange repeat lumbar epidural steroid injection. 50% WB Right lower extremity with walker.

## 2021-03-20 NOTE — H&P (Signed)
TOTAL HIP ADMISSION H&P  Patient is admitted for conversion to right total hip arthroplasty.  Subjective:  Chief Complaint: right hip pain  HPI: Brittany Santiago, 88 y.o. female, has a history of pain and functional disability in the right hip(s) due to arthritis and failed hemiarthroplasty  and patient has failed non-surgical conservative treatments for greater than 12 weeks to include NSAID's and/or analgesics, flexibility and strengthening excercises, use of assistive devices, weight reduction as appropriate, and activity modification.  Onset of symptoms was gradual starting 2 years ago with gradually worsening course since that time.The patient noted prior procedures of the hip to include hemi-arthroplasty on the right hip(s).  Patient currently rates pain in the right hip at 10 out of 10 with activity. Patient has night pain, worsening of pain with activity and weight bearing, trendelenberg gait, pain that interfers with activities of daily living, and pain with passive range of motion. Patient has evidence of subchondral cysts, subchondral sclerosis, periarticular osteophytes, and joint space narrowing by imaging studies. This condition presents safety issues increasing the risk of falls. This patient has had falilure of hemi-arthroplasty.  There is no current active infection.  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   Enterocele 12/30/2017   Menopausal symptom 05/29/2017   Pre-operative cardiovascular examination 05/29/2017   Endometrial cancer ([place]) 05/27/2017   Dyslipidemia 03/10/2017   Left bundle branch block (LBBB) 03/04/2017   Coronary artery disease of native artery of native heart with stable angina  pectoris ([place]) 03/16/2015   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   Coronary atherosclerosis of native coronary artery 03/16/2015   Coronary artery disease involving native coronary artery of native heart without angina pectoris 03/16/2015   Atherosclerosis of coronary artery 03/16/2015   Asthma, moderate persistent 08/02/2013   Thyroid disease    Past Medical History:  Diagnosis Date   Asthma, moderate persistent 08/02/2013   6/9/2015[place]: normal CXR normal  ; DENIS , STATES I HAD BRONCHITIS BEFORE AND USED AN INHALER TEMPORARILY    Atherosclerosis of coronary artery 03/16/2015   Cancer ([place])    endometrial    Coronary artery disease involving native coronary artery of native heart without angina pectoris 03/16/2015   Coronary artery disease of native artery of native heart with stable angina pectoris ([place]) 03/16/2015   Coronary atherosclerosis of native coronary artery 03/16/2015   Dyslipidemia 03/10/2017   Endometrial cancer ([place]) 05/27/2017   Enterocele 12/30/2017   Female bladder prolapse    GERD (gastroesophageal reflux disease)    Hyperlipidemia    Hypertension    Hypothyroidism    Left bundle branch block (LBBB) 03/04/2017   Menopausal symptom 05/29/2017   Mild aortic regurgitation 11/15/2019   Mild aortic stenosis 11/15/2019   Pre-operative cardiovascular examination 05/29/2017   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 Added automatically from request for surgery [id number]    Past Surgical History:  Procedure Laterality Date   ABDOMINAL HYSTERECTOMY  2019   APPENDECTOMY     yaken with first c section    CESAREAN SECTION     x2  CORONARY ARTERY BYPASS GRAFT  02/2015   DOUBLE BYPASS ; DONE AT  [place]   bilateral cataract extraction    JOINT REPLACEMENT Right    hip   KNEE ARTHROSCOPY Left    neck back surgery      ROBOTIC ASSISTED TOTAL HYSTERECTOMY WITH BILATERAL SALPINGO OOPHERECTOMY Bilateral 06/15/2017   Procedure: XI ROBOTIC ASSISTED TOTAL HYSTERECTOMY WITH BILATERAL SALPINGO OOPHORECTOMY WITH SENTINAL LYMPH NODES, PELVIC LYMPHADENECTOMY, AND LYSIS OF ADHESIONS;  Surgeon: Everitt Amber, MD;  Location: [facility];  Service: Gynecology;  Laterality: Bilateral;   ROTATOR CUFF REPAIR Left    THYROID SURGERY     THYROIDECTOMY  2000s   VAGINAL PROLAPSE REPAIR      Current Facility-Administered Medications  Medication Dose Route Frequency Provider Last Rate Last Admin   0.9 %  sodium chloride infusion   Intravenous Continuous Cherlynn June B, PA       acetaminophen (OFIRMEV) IV 1,000 mg  1,000 mg Intravenous Once McCauley, Larry B, PA       acetaminophen (TYLENOL) tablet 1,000 mg  1,000 mg Oral Once Brennan Bailey, MD       ceFAZolin (ANCEF) IVPB 2g/100 mL premix  2 g Intravenous On Call to OR Dorothyann Peng, PA       chlorhexidine (PERIDEX) 0.12 % solution 15 mL  15 mL Mouth/Throat Once Duane Boston, MD       Or   MEDLINE mouth rinse  15 mL Mouth Rinse Once Duane Boston, MD       lactated ringers infusion   Intravenous Continuous Duane Boston, MD       povidone-iodine 10 % swab 2 application  2 application Topical Once Cherlynn June B, PA       povidone-iodine 10 % swab 2 application  2 application Topical Once McCauley, Larry B, PA       tranexamic acid (CYKLOKAPRON) IVPB 1,000 mg  1,000 mg Intravenous To OR Cherlynn June B, PA       No Known Allergies  Social History   Tobacco Use   Smoking status: Never   Smokeless tobacco: Never  Substance Use Topics   Alcohol use: No    Family History  Problem Relation Age of Onset   Heart failure Mother    Depression Father        commited suicide   Kidney cancer Brother    Leukemia Brother      Review of Systems  Musculoskeletal:  Positive for arthralgias, back pain and gait problem.  All other systems reviewed and are  negative.  Objective:  Physical Exam Vitals reviewed.  Constitutional:      Appearance: Normal appearance.  HENT:     Head: Normocephalic and atraumatic.     Right Ear: External ear normal.     Left Ear: External ear normal.     Nose: Nose normal.     Mouth/Throat:     Mouth: Mucous membranes are moist.     Pharynx: Oropharynx is clear.  Eyes:     Extraocular Movements: Extraocular movements intact.     Pupils: Pupils are equal, round, and reactive to light.  Cardiovascular:     Rate and Rhythm: Normal rate and regular rhythm.     Pulses: Normal pulses.  Pulmonary:     Effort: Pulmonary effort is normal. No respiratory distress.  Abdominal:     General: Abdomen is flat.     Palpations: Abdomen is soft.  Genitourinary:    Comments: deferred Musculoskeletal:     Cervical back: Normal range of motion and neck supple.     Right hip: Tenderness present. Decreased range of motion. Decreased strength.       Legs:  Skin:    General: Skin is warm and dry.     Capillary Refill: Capillary refill takes less than 2 seconds.  Neurological:     General: No focal deficit present.     Mental Status: She is Santiago and oriented to person, place, and time.  Psychiatric:        Mood and Affect: Mood normal.        Behavior: Behavior normal.        Thought Content: Thought content normal.        Judgment: Judgment normal.    Vital signs in last 24 hours: Temp:  [98.5 F (36.9 C)] 98.5 F (36.9 C) (01/25 0530) Pulse Rate:  [75] 75 (01/25 0530) Resp:  [16] 16 (01/25 0530) BP: (181)/(65) 181/65 (01/25 0530) SpO2:  [99 %] 99 % (01/25 0530) Weight:  [61.2 kg] 61.2 kg (01/25 0557)  Labs:   Estimated body mass index is 26.37 kg/m as calculated from the following:   Height as of this encounter: 5' (1.524 m).   Weight as of this encounter: 61.2 kg.   Imaging Review [place] radiographs demonstrate severe degenerative joint disease of the right hip(s). The bone quality appears to be  adequate for age and reported activity level.      Assessment/Plan:  End stage arthritis, right hip(s) w/ failed hemiarthroplasty  The patient history, physical examination, clinical judgement of the provider and imaging studies are consistent with end stage degenerative joint disease of the right hip(s) and total hip arthroplasty is deemed medically necessary. The treatment options including medical management, injection therapy, arthroscopy and arthroplasty were discussed at length. The risks and benefits of total hip arthroplasty were presented and reviewed. The risks due to aseptic loosening, infection, stiffness, dislocation/subluxation,  thromboembolic complications and other imponderables were discussed.  The patient acknowledged the explanation, agreed to proceed with the plan and consent was signed. Patient is being admitted for inpatient treatment for surgery, pain control, PT, OT, prophylactic antibiotics, VTE prophylaxis, progressive ambulation and ADL's and discharge planning.The patient is planning to be discharged  home w HEP    Patient's anticipated LOS is less than 2 midnights, meeting these requirements: - Younger than 44 - Lives within 1 hour of care - Has a competent adult at home to recover with post-op recover - NO history of  - Chronic pain requiring opiods  - Diabetes  - Coronary Artery Disease  - Heart failure  - Heart attack  - Stroke  - DVT/VTE  - Cardiac arrhythmia  - Respiratory Failure/COPD  - Renal failure  - Anemia  - Advanced Liver disease

## 2021-03-21 ENCOUNTER — Inpatient Hospital Stay (HOSPITAL_COMMUNITY): Payer: Medicare Other

## 2021-03-21 LAB — CBC
HCT: 20.1 % — ABNORMAL LOW (ref 36.0–46.0)
Hemoglobin: 6.7 g/dL — CL (ref 12.0–15.0)
MCH: 30.5 pg (ref 26.0–34.0)
MCHC: 33.3 g/dL (ref 30.0–36.0)
MCV: 91.4 fL (ref 80.0–100.0)
Platelets: 125 10*3/uL — ABNORMAL LOW (ref 150–400)
RBC: 2.2 MIL/uL — ABNORMAL LOW (ref 3.87–5.11)
RDW: 13.3 % (ref 11.5–15.5)
WBC: 8.5 10*3/uL (ref 4.0–10.5)
nRBC: 0 % (ref 0.0–0.2)

## 2021-03-21 LAB — BASIC METABOLIC PANEL
Anion gap: 5 (ref 5–15)
BUN: 23 mg/dL (ref 8–23)
CO2: 21 mmol/L — ABNORMAL LOW (ref 22–32)
Calcium: 8 mg/dL — ABNORMAL LOW (ref 8.9–10.3)
Chloride: 105 mmol/L (ref 98–111)
Creatinine, Ser: 1.43 mg/dL — ABNORMAL HIGH (ref 0.44–1.00)
GFR, Estimated: 35 mL/min — ABNORMAL LOW (ref >60)
Glucose, Bld: 148 mg/dL — ABNORMAL HIGH (ref 70–99)
Potassium: 4.4 mmol/L (ref 3.5–5.1)
Sodium: 131 mmol/L — ABNORMAL LOW (ref 135–145)

## 2021-03-21 LAB — PREPARE RBC (CROSSMATCH)

## 2021-03-21 MED ORDER — SODIUM CHLORIDE 0.9% IV SOLUTION: Freq: Once | INTRAVENOUS | Status: AC

## 2021-03-21 NOTE — Care Plan (Signed)
Ortho Bundle Case Management Note  Patient Details  Name: Brittany Santiago MRN: [id number] Date of Birth: 05/30/1930  R THA on 03-20-20 DCP:  Home with dtr.   DME:  RW ordered through [place] PT:  HEP                   DME Arranged:  Walker rolling DME Agency:  [employer]  HH Arranged:  NA [employer] Agency:  NA  Additional Comments: Please contact me with any questions of if this plan should need to change.  Marianne Sofia, RN,CCM [facility]  [phone] 03/21/2021, 7:44 AM

## 2021-03-21 NOTE — Progress Notes (Signed)
Date and time results received: 03/21/21 04:45 am  Test: Hgb Critical Value: 6.7  Name of Provider Notified: Waldron Labs PA-C  Orders Received? Or Actions Taken?: 2 units PRBC's, H&H after

## 2021-03-21 NOTE — Progress Notes (Signed)
MRI to be cancel by ER pt not stable

## 2021-03-21 NOTE — TOC Transition Note (Signed)
Transition of Care [facility]) - CM/SW Discharge Note  Patient Details  Name: Brittany Santiago MRN: [id number] Date of Birth: [birth year]  [facility] CM/SW Contact:  Sherie Don, LCSW Phone Number: 03/21/2021, 10:07 AM  Clinical Narrative: Patient is expected to discharge home after working with PT. CSW met with patient to confirm discharge plan. Patient will discharge home with a home exercise program (HEP). Patient has elevated toilets, but will need a youth rolling walker. [employer] delivered walker to patient's room. TOC signing off.  Final next level of care: Home/Self Care Barriers to Discharge: No Barriers Identified  Patient Goals and CMS Choice Patient states their goals for this hospitalization and ongoing recovery are:: Discharge home with HEP CMS Medicare.gov Compare Post Acute Care list provided to:: Patient Choice offered to / list presented to : Patient  Discharge Plan and Services         DME Arranged: [place] Rile youth DME Agency: [employer] Representative spoke with at DME Agency: Prearranged in orthopedist's office HH Arranged: NA [place] Agency: NA  Readmission Risk Interventions No flowsheet data found.

## 2021-03-21 NOTE — Evaluation (Signed)
Occupational Therapy Evaluation Patient Details Name: Brittany Santiago MRN: [id number] DOB: 1930-07-08 Today's Date: 03/21/2021   History of Present Illness 88 yo female s/p conversion of previous hemiarthroplasty to R DA-THA on 1/25. PMH includes HTN, HLD, endometrial cancer, CAD with history of CABG, asthma, LBBB, R THA.   Clinical Impression   Patient typically lives alone and is independent at baseline however DTR that lives locally can stay with her 24/7 as needed. Cleared with RN for patient to sit up EOB as her hemoglobin is low and currently receiving blood transfusion at time of evaluation. Patient with no complaints of dizziness, nausea throughout session. Patient recalled hip precautions from PT eval and educated on how this impacts performing self care tasks. Patient educated on hip kit items and return demonstrated use of reacher + sock aid with min cues for technique. Patient able to scoot and perform 3/4 stand to fix her bed sheets at edge of bed at min G level. Recommend continued acute OT services to maximize patient safety and independence with self care.       Recommendations for follow up therapy are one component of a multi-disciplinary discharge planning process, led by the attending physician.  Recommendations may be updated based on patient status, additional functional criteria and insurance authorization.   Follow Up Recommendations  Home health OT    Assistance Recommended at Discharge Intermittent Supervision/Assistance  Patient can return home with the following A little help with walking and/or transfers;A little help with bathing/dressing/bathroom;Assistance with cooking/housework    Functional Status Assessment  Patient has had a recent decline in their functional status and demonstrates the ability to make significant improvements in function in a reasonable and predictable amount of time.  Equipment Recommendations  None recommended by OT       Precautions /  Restrictions Precautions Precautions: Posterior Hip;Fall Precaution Booklet Issued: Yes (comment) Precaution Comments: no hip flexion >90 degrees, no hip IR past neutral, no hip adduction past midline/no crossing legs reviewed verbally, during mobility, and handout administered Restrictions Weight Bearing Restrictions: Yes RLE Weight Bearing: Partial weight bearing RLE Partial Weight Bearing Percentage or Pounds: 50      Mobility Bed Mobility Overal bed mobility: Needs Assistance Bed Mobility: Supine to Sit, Sit to Supine     Supine to sit: Min assist, HOB elevated Sit to supine: Min assist   General bed mobility comments: Min A for assisting R LE to edge of bed and then to lift back onto bed        Balance Overall balance assessment: Needs assistance Sitting-balance support: Feet supported Sitting balance-Leahy Scale: Good                                     ADL either performed or assessed with clinical judgement   ADL Overall ADL's : Needs assistance/impaired Eating/Feeding: Independent   Grooming: Set up;Sitting   Upper Body Bathing: Set up;Sitting   Lower Body Bathing: Maximal assistance;Sitting/lateral leans;Sit to/from stand Lower Body Bathing Details (indicate cue type and reason): due to hip precautions Upper Body Dressing : Set up;Sitting   Lower Body Dressing: Maximal assistance;Sitting/lateral leans;Sit to/from stand Lower Body Dressing Details (indicate cue type and reason): Due to hip precautions. Patient educated in use of hip kit items and practiced with reacher + sock aid. Patient able to return demo use of items with min cues for technique Toilet Transfer: Magazine features editor  Details (indicate cue type and reason): Not formally assessed as patient with low hemoglobin and receiving blood. Patient able to come to 3/4 stand at edge of bed to adjust bed pads without physical assistance Toileting- Clothing Manipulation and Hygiene:  Minimal assistance Toileting - Clothing Manipulation Details (indicate cue type and reason): anticipated due to limited assist with bed mobility     Functional mobility during ADLs: Min guard General ADL Comments: Patient able to recall 3/3 hip precautions at beginning of session      Pertinent Vitals/Pain Pain Assessment Pain Assessment: Faces Faces Pain Scale: Hurts little more Pain Location: R hip Pain Descriptors / Indicators: Sore Pain Intervention(s): Monitored during session     Hand Dominance Right   Extremity/Trunk Assessment Upper Extremity Assessment Upper Extremity Assessment: Overall WFL for tasks assessed   Lower Extremity Assessment Lower Extremity Assessment: Defer to PT evaluation       Communication Communication Communication: No difficulties   Cognition Arousal/Alertness: Awake/alert Behavior During Therapy: WFL for tasks assessed/performed Overall Cognitive Status: Within Functional Limits for tasks assessed                                                  Home Living Family/patient expects to be discharged to:: Private residence Living Arrangements: Alone Available Help at Discharge: Family;Available 24 hours/day (daughter lives in town and can stay 24/7 if needed) Type of Home: House Home Access: Stairs to enter Technical brewer of Steps: 1   Home Layout: Two level Alternate Level Stairs-Number of Steps: stair lift for 13 steps to enter home   Bathroom Shower/Tub: Occupational psychologist: Handicapped height     [place]: [place] - single point;BSC/3in1;Shower seat          Prior Functioning/Environment Prior Level of Function : Independent/Modified Independent             Mobility Comments: pt reports she was "very independent" PTA          OT Problem List: Decreased activity tolerance;Impaired balance (sitting and/or standing);Decreased safety awareness;Decreased knowledge of use of  DME or AE;Decreased knowledge of precautions;Pain      OT Treatment/Interventions: Self-care/ADL training;Therapeutic exercise;DME and/or AE instruction;Therapeutic activities;Patient/family education;Balance training    OT Goals(Current goals can be found in the care plan section) Acute Rehab OT Goals Patient Stated Goal: "I want one of those hip kits" OT Goal Formulation: With patient Time For Goal Achievement: 04/04/21 Potential to Achieve Goals: Good  OT Frequency: Min 2X/week       AM-PAC OT "6 Clicks" Daily Activity     Outcome Measure Help from another person eating meals?: None Help from another person taking care of personal grooming?: A Little Help from another person toileting, which includes using toliet, bedpan, or urinal?: A Little Help from another person bathing (including washing, rinsing, drying)?: A Lot Help from another person to put on and taking off regular upper body clothing?: A Little Help from another person to put on and taking off regular lower body clothing?: A Lot 6 Click Score: 17   End of Session Nurse Communication: Mobility status  Activity Tolerance: Patient tolerated treatment well Patient left: in bed;with call bell/phone within reach  OT Visit Diagnosis: Other abnormalities of gait and mobility (R26.89);Pain Pain - Right/Left: Right Pain - part of body: Hip  Time: 2902-1115 OT Time Calculation (min): 25 min Charges:  OT General Charges $OT Visit: 1 Visit OT Evaluation $OT Eval Low Complexity: 1 Low OT Treatments $Self Care/Home Management : 8-22 mins  Delbert Phenix OT OT pager: [phone]  Rosemary Holms 03/21/2021, 12:27 PM

## 2021-03-21 NOTE — Progress Notes (Signed)
Subjective: 1 Day Post-Op Procedure(s) (LRB): Conversion of previous hemiarthroplasty  to right total hip arthroplasty (Right) Patient reports pain as 4 on 0-10 scale.    Objective: Vital signs in last 24 hours: Temp:  [96.6 F (35.9 C)-98.7 F (37.1 C)] 98.5 F (36.9 C) (01/26 1145) Pulse Rate:  [65-92] 72 (01/26 1145) Resp:  [15-20] 16 (01/26 1145) BP: (116-137)/(36-59) 121/41 (01/26 1145) SpO2:  [95 %-100 %] 96 % (01/26 1145)  Intake/Output from previous day: 01/25 0701 - 01/26 0700 In: 5338 [P.O.:480; I.V.:4308; IV Piggyback:550] Out: 1750 [Urine:950; Blood:800] Intake/Output this shift: Total I/O In: 385.4 [Blood:385.4] Out: -   Recent Labs    03/21/21 0311  HGB 6.7*   Recent Labs    03/21/21 0311  WBC 8.5  RBC 2.20*  HCT 20.1*  PLT 125*   Recent Labs    03/21/21 0311  NA 131*  K 4.4  CL 105  CO2 21*  BUN 23  CREATININE 1.43*  GLUCOSE 148*  CALCIUM 8.0*   No results for input(s): LABPT, INR in the last 72 hours.  ABD soft Sensation intact distally Intact pulses distally Dorsiflexion/Plantar flexion intact Incision: dressing C/D/I Plantar flexion right foot weak vs Left, with slight decrease in light touch entire Right foot vs left  Assessment/Plan: 1 Day Post-Op Procedure(s) (LRB): Conversion of previous hemiarthroplasty  to right total hip arthroplasty (Right) Post blood loss anemia- receiving 2 units  50% weight bearing status  Aspirin 81mg  BID for DVT prophylaxis Advance diet as tolerated Foley to be discontinued after blood transfusion Encourage incentive spirometer     BLAIR Wilver Tignor 03/21/2021, 12:13 PM [phone]

## 2021-03-21 NOTE — Plan of Care (Signed)
  Problem: Activity: Goal: Ability to avoid complications of mobility impairment will improve Outcome: Progressing   Problem: Clinical Measurements: Goal: Postoperative complications will be avoided or minimized Outcome: Progressing   Problem: Pain Management: Goal: Pain level will decrease with appropriate interventions Outcome: Progressing   

## 2021-03-21 NOTE — Progress Notes (Signed)
Physical Therapy Treatment Patient Details Name: Brittany Santiago MRN: [id number] DOB: [birth year] Today's Date: 03/21/2021   History of Present Illness 88 yo female s/p conversion of previous hemiarthroplasty to R DA-THA on 1/25. PMH includes HTN, HLD, endometrial cancer, CAD with history of CABG, asthma, LBBB, R THA.    PT Comments    POD # 1 pm session Pt complete 2 units of blood.  Assisted OOB to amb required increased time.  General bed mobility comments: Min A for assisting R LE to edge plus using bed pad to complete scooting to EOB.  General transfer comment: close guard for safety, cues for safe hand placement and keeping trunk upright during rise to prevent hip flexion >90 deg.  General Gait Details: tolerated an increased distance after receiving 2 units blood.  Slow to start and slightly unsteady.  Short steps.  Did well to adhere to her PWB. Returned to room and performed some THR TE's followed by ICE. Pt has NOT met her mobility goals to safely D/C to home today.    Recommendations for follow up therapy are one component of a multi-disciplinary discharge planning process, led by the attending physician.  Recommendations may be updated based on patient status, additional functional criteria and insurance authorization.  Follow Up Recommendations  Follow physician's recommendations for discharge plan and follow up therapies     Assistance Recommended at Discharge Intermittent Supervision/Assistance  Patient can return home with the following A little help with walking and/or transfers;A little help with bathing/dressing/bathroom;Help with stairs or ramp for entrance;Assist for transportation   Equipment Recommendations  Rolling walker (2 wheels) (youth)    Recommendations for Other Services       Precautions / Restrictions Precautions Precautions: Posterior Hip;Fall Precaution Comments: THP/PWB Restrictions Weight Bearing Restrictions: Yes RLE Weight Bearing: Partial weight  bearing RLE Partial Weight Bearing Percentage or Pounds: 50     Mobility  Bed Mobility Overal bed mobility: Needs Assistance Bed Mobility: Supine to Sit     Supine to sit: Min assist, HOB elevated     General bed mobility comments: Min A for assisting R LE to edge plus using bed pad to complete scooting to EOB    Transfers Overall transfer level: Needs assistance Equipment used: Rolling walker (2 wheels) (youth) Transfers: Sit to/from Stand Sit to Stand: Min guard           General transfer comment: close guard for safety, cues for safe hand placement and keeping trunk upright during rise to prevent hip flexion >90 deg    Ambulation/Gait Ambulation/Gait assistance: Supervision, Min guard Gait Distance (Feet): 32 Feet Assistive device: Rolling walker (2 wheels) (youth) Gait Pattern/deviations: Trunk flexed, Step-to pattern, Decreased weight shift to right, Decreased step length - right Gait velocity: decreased     General Gait Details: tolerated an increased distance after receiving 2 units blood.  Slow to start and slightly unsteady.  Short steps.  Did well to adhere to her PWB.   Stairs             Wheelchair Mobility    Modified Rankin (Stroke Patients Only)       Balance                                            Cognition   Behavior During Therapy: WFL for tasks assessed/performed Overall Cognitive Status: Within Functional Limits for  tasks assessed                                 General Comments: AxO x 3 very sweet and recalls 3/3 THP as well as PWB        Exercises   Total Hip Replacement TE's following HEP Handout 10 reps ankle pumps 05 reps knee presses 05 reps heel slides 05 reps SAQ's 05 reps ABD Instructed how to use a belt loop to assist  Followed by ICE    General Comments        Pertinent Vitals/Pain Pain Assessment Pain Assessment: Faces Faces Pain Scale: Hurts a little bit Pain  Location: R hip Pain Descriptors / Indicators: Tender Pain Intervention(s): Monitored during session, Repositioned, Ice applied    Home Living                          Prior Function            PT Goals (current goals can now be found in the care plan section) Progress towards PT goals: Progressing toward goals    Frequency    7X/week      PT Plan Current plan remains appropriate    Co-evaluation              AM-PAC PT "6 Clicks" Mobility   Outcome Measure  Help needed turning from your back to your side while in a flat bed without using bedrails?: A Little Help needed moving from lying on your back to sitting on the side of a flat bed without using bedrails?: A Little Help needed moving to and from a bed to a chair (including a wheelchair)?: A Little Help needed standing up from a chair using your arms (e.g., wheelchair or bedside chair)?: A Little Help needed to walk in hospital room?: A Little Help needed climbing 3-5 steps with a railing? : A Lot 6 Click Score: 17    End of Session Equipment Utilized During Treatment: Gait belt Activity Tolerance: Patient tolerated treatment well Patient left: in chair;with family/visitor present;Other (comment) Nurse Communication: Mobility status PT Visit Diagnosis: Other abnormalities of gait and mobility (R26.89);Muscle weakness (generalized) (M62.81)     Time: 5537-4827 PT Time Calculation (min) (ACUTE ONLY): 26 min  Charges:  $Gait Training: 8-22 mins $Therapeutic Exercise: 8-22 mins                     Rica Koyanagi  PTA Acute  Rehabilitation Services Pager      [phone] Office      [phone]

## 2021-03-21 NOTE — Progress Notes (Signed)
PT Cancellation Note  Patient Details Name: Brittany Santiago MRN: [id number] DOB: 1930-12-18   Cancelled AM Treatment:     HgB 6.7 pt scheduled to receive 2 units blood.  Will see pt after completion of her second unit.   Rica Koyanagi  PTA Acute  Rehabilitation Services Pager      [phone] Office      [phone]

## 2021-03-22 ENCOUNTER — Encounter (HOSPITAL_COMMUNITY): Payer: Self-pay | Admitting: Orthopedic Surgery

## 2021-03-22 LAB — CBC
HCT: 34.5 % — ABNORMAL LOW (ref 36.0–46.0)
Hemoglobin: 11.8 g/dL — ABNORMAL LOW (ref 12.0–15.0)
MCH: 29.4 pg (ref 26.0–34.0)
MCHC: 34.2 g/dL (ref 30.0–36.0)
MCV: 86 fL (ref 80.0–100.0)
Platelets: 148 10*3/uL — ABNORMAL LOW (ref 150–400)
RBC: 4.01 MIL/uL (ref 3.87–5.11)
RDW: 14.7 % (ref 11.5–15.5)
WBC: 12.7 10*3/uL — ABNORMAL HIGH (ref 4.0–10.5)
nRBC: 0 % (ref 0.0–0.2)

## 2021-03-22 LAB — TYPE AND SCREEN
ABO/RH(D): A POS
Antibody Screen: NEGATIVE
Unit division: 0
Unit division: 0

## 2021-03-22 LAB — BPAM RBC
Blood Product Expiration Date: 202302112359
Blood Product Expiration Date: 202302112359
ISSUE DATE / TIME: 202301260805
ISSUE DATE / TIME: 202301261145
Unit Type and Rh: 6200
Unit Type and Rh: 6200

## 2021-03-22 MED ORDER — METHOCARBAMOL 500 MG PO TABS: 500.0000 mg | ORAL_TABLET | Freq: Four times a day (QID) | ORAL | 0 refills | Status: DC | PRN

## 2021-03-22 MED ORDER — ONDANSETRON HCL 4 MG PO TABS: 4.0000 mg | ORAL_TABLET | Freq: Four times a day (QID) | ORAL | 0 refills | Status: DC | PRN

## 2021-03-22 MED ORDER — HYDROCODONE-ACETAMINOPHEN 5-325 MG PO TABS: 1.0000 | ORAL_TABLET | ORAL | 0 refills | Status: DC | PRN

## 2021-03-22 MED ORDER — SENNA 8.6 MG PO TABS: 2.0000 | ORAL_TABLET | Freq: Every day | ORAL | 0 refills | Status: AC

## 2021-03-22 MED ORDER — ASPIRIN 81 MG PO CHEW: 81.0000 mg | CHEWABLE_TABLET | Freq: Two times a day (BID) | ORAL | 0 refills | Status: AC

## 2021-03-22 MED ORDER — DOCUSATE SODIUM 100 MG PO CAPS: 100.0000 mg | ORAL_CAPSULE | Freq: Two times a day (BID) | ORAL | 0 refills | Status: AC

## 2021-03-22 NOTE — Progress Notes (Addendum)
° ° °  Subjective:  Patient reports pain as mild to moderate.  Denies N/V/CP/SOB. Went for MRI lumbar spine showing progressive degenerative change, no acute finding such as hematoma, etc. C/o plantar R foot sensory change - has had this in the past  Objective:   VITALS:   Vitals:   03/21/21 1525 03/21/21 2133 03/22/21 0540 03/22/21 0949  BP: (!) 153/61 (!) 154/52 (!) 152/51 (!) 137/55  Pulse: 73 68 64 68  Resp: 18 18 16 16   Temp: 98.2 F (36.8 C) 97.9 F (36.6 C) 97.9 F (36.6 C) 98.4 F (36.9 C)  TempSrc: Oral Oral Oral Oral  SpO2: 98% 98% 96% 97%  Weight:      Height:        NAD ABD soft Sensation intact distally Intact pulses distally Dorsiflexion/Plantar flexion intact Incision: dressing C/D/I Compartment soft R foot DF, EHL 5/5; PF 4/5  Lab Results  Component Value Date   WBC 12.7 (H) 03/22/2021   HGB 11.8 (L) 03/22/2021   HCT 34.5 (L) 03/22/2021   MCV 86.0 03/22/2021   PLT 148 (L) 03/22/2021   BMET    Component Value Date/Time   NA 131 (L) 03/21/2021 0311   NA 138 11/15/2019 1149   K 4.4 03/21/2021 0311   CL 105 03/21/2021 0311   CO2 21 (L) 03/21/2021 0311   GLUCOSE 148 (H) 03/21/2021 0311   BUN 23 03/21/2021 0311   BUN 17 11/15/2019 1149   CREATININE 1.43 (H) 03/21/2021 0311   CALCIUM 8.0 (L) 03/21/2021 0311   GFRNONAA 35 (L) 03/21/2021 0311     Assessment/Plan: 2 Days Post-Op   Principal Problem:   Failed orthopedic implant, initial encounter ([place])   50% WB RLE with walker DVT ppx: Aspirin, SCDs, TEDS PO pain control PT/OT LBP pain: MRI negative for hematoma- shows mild stenosis and severe multilevel NFN, I instructed her to contact her back doctor for repeat ESI Dispo: D/C home with HEP, will need OPPT once I change her WB status    Hilton Cork Pierre Dellarocco 03/22/2021, 11:48 AM   Rod Can, MD [phone] [facility]   [place] [address], [place], [place] [address] Phone:  [phone] [email or web address]   [employer]

## 2021-03-22 NOTE — Progress Notes (Signed)
Physical Therapy Treatment Patient Details Name: Brittany Santiago MRN: [id number] DOB: April 05, 1930 Today's Date: 03/22/2021   History of Present Illness 88 yo female s/p conversion of previous hemiarthroplasty to R DA-THA on 1/25. PMH includes HTN, HLD, endometrial cancer, CAD with history of CABG, asthma, LBBB, R THA.    PT Comments    POD # 2 pm session with Daughter and Son in [place] present. Daughter nervous about pt D/Cing to home today.  "Last time she went to a facility for 2 weeks".  Pt moving at Supervision level but will still need some "family support" as pt lives home alone.  Educated on THP, [place] status, HEP and appropriate activity level.  Educated on daily home needs and Supervision level.  Daughter ensure she would be there "the first few days then go from there".   Addressed all mobility questions, discussed appropriate activity, educated on use of ICE.  Pt ready for D/C to home.   Recommendations for follow up therapy are one component of a multi-disciplinary discharge planning process, led by the attending physician.  Recommendations may be updated based on patient status, additional functional criteria and insurance authorization.  Follow Up Recommendations  Follow physician's recommendations for discharge plan and follow up therapies     Assistance Recommended at Discharge Intermittent Supervision/Assistance  Patient can return home with the following A little help with walking and/or transfers;A little help with bathing/dressing/bathroom;Help with stairs or ramp for entrance;Assist for transportation   Equipment Recommendations  Rolling walker (2 wheels)    Recommendations for Other Services       Precautions / Restrictions Precautions Precautions: Posterior Hip;Fall Precaution Booklet Issued: Yes (comment) Precaution Comments: THP/PWB Restrictions Weight Bearing Restrictions: Yes RLE Weight Bearing: Partial weight bearing     Mobility  Bed Mobility Overal  bed mobility: Needs Assistance Bed Mobility: Supine to Sit     Supine to sit: Supervision     General bed mobility comments: pt self able using swtrap to assist R LE off bed    Transfers Overall transfer level: Needs assistance Equipment used: Rolling walker (2 wheels) Transfers: Sit to/from Stand Sit to Stand: Supervision           General transfer comment: increased time with caution to avoid hip flex > 90 degrees "keep your nose in the air".  Good hand placement.  Also self able with toilet transfer.    Ambulation/Gait Ambulation/Gait assistance: Supervision Gait Distance (Feet): 26 Feet Assistive device: Rolling walker (2 wheels) Gait Pattern/deviations: Trunk flexed, Step-to pattern, Decreased weight shift to right, Decreased step length - right Gait velocity: decreased     General Gait Details: kept amb distance at a functional distance due to PWB.   Stairs Stairs: Yes Stairs assistance: Min assist Stair Management: No rails, Step to pattern, Backwards, With walker Number of Stairs: 1 General stair comments: had Daughter "hands on" assist pt up backward one step.  Performed twice.  Educated up backward due to [facility].   Wheelchair Mobility    Modified Rankin (Stroke Patients Only)       Balance                                            Cognition Arousal/Alertness: Awake/alert Behavior During Therapy: WFL for tasks assessed/performed Overall Cognitive Status: Within Functional Limits for tasks assessed  General Comments: Patient recall PWB and needing verbal cues to recall 2/3 hip precautions        Exercises      General Comments        Pertinent Vitals/Pain Pain Assessment Pain Assessment: Faces Faces Pain Scale: Hurts a little bit Pain Location: R hip Pain Descriptors / Indicators: Tender Pain Intervention(s): Monitored during session, Repositioned, Ice applied    Home  Living                          Prior Function            PT Goals (current goals can now be found in the care plan section)      Frequency    7X/week      PT Plan Current plan remains appropriate    Co-evaluation              AM-PAC PT "6 Clicks" Mobility   Outcome Measure  Help needed turning from your back to your side while in a flat bed without using bedrails?: A Little Help needed moving from lying on your back to sitting on the side of a flat bed without using bedrails?: A Little Help needed moving to and from a bed to a chair (including a wheelchair)?: A Little Help needed standing up from a chair using your arms (e.g., wheelchair or bedside chair)?: A Little Help needed to walk in hospital room?: A Little Help needed climbing 3-5 steps with a railing? : A Little 6 Click Score: 18    End of Session Equipment Utilized During Treatment: Gait belt Activity Tolerance: Patient tolerated treatment well Patient left: in chair;with family/visitor present;Other (comment) Nurse Communication: Mobility status PT Visit Diagnosis: Other abnormalities of gait and mobility (R26.89);Muscle weakness (generalized) (M62.81)     Time: 1405-1430 PT Time Calculation (min) (ACUTE ONLY): 25 min  Charges:  $Gait Training: 8-22 mins $Self Care/Home Management: [place]  PTA Acute  Rehabilitation Services Pager      [phone] Office      [phone]

## 2021-03-22 NOTE — Care Management Important Message (Signed)
Important Message  Patient Details IM Letter placed in Patients room Name: Brittany Santiago MRN: [id number] Date of Birth: 1930/04/23   Medicare Important Message Given:  Yes     Kerin Salen 03/22/2021, 11:35 AM

## 2021-03-22 NOTE — Progress Notes (Signed)
Physical Therapy Treatment Patient Details Name: MECHELLE PATES MRN: [id number] DOB: 1930/05/08 Today's Date: 03/22/2021   History of Present Illness 88 yo female s/p conversion of previous hemiarthroplasty to R DA-THA on 1/25. PMH includes HTN, HLD, endometrial cancer, CAD with history of CABG, asthma, LBBB, R THA.    PT Comments    POD # 2 am session Pt already OOB via OT.  Assisted with amb in hallway and practiced one step she has to enter home. Then returned to room to perform some TE's following HEP handout.  Instructed on proper tech, freq as well as use of ICE.   Pt will need a second PT session with family present for education.   Recommendations for follow up therapy are one component of a multi-disciplinary discharge planning process, led by the attending physician.  Recommendations may be updated based on patient status, additional functional criteria and insurance authorization.  Follow Up Recommendations  Follow physician's recommendations for discharge plan and follow up therapies     Assistance Recommended at Discharge Intermittent Supervision/Assistance  Patient can return home with the following A little help with walking and/or transfers;A little help with bathing/dressing/bathroom;Help with stairs or ramp for entrance;Assist for transportation   Equipment Recommendations  Rolling walker (2 wheels)    Recommendations for Other Services       Precautions / Restrictions Precautions Precautions: Posterior Hip;Fall Precaution Booklet Issued: Yes (comment) Precaution Comments: THP/PWB Restrictions Weight Bearing Restrictions: Yes RLE Weight Bearing: Partial weight bearing     Mobility  Bed Mobility               General bed mobility comments: OOB in recliner    Transfers Overall transfer level: Needs assistance Equipment used: Rolling walker (2 wheels) Transfers: Sit to/from Stand Sit to Stand: Supervision           General transfer comment:  increased time with caution to avoid hip flex > 90 degrees "keep your nose in the air".  Good hand placement.    Ambulation/Gait Ambulation/Gait assistance: Supervision Gait Distance (Feet): 42 Feet Assistive device: Rolling walker (2 wheels) Gait Pattern/deviations: Trunk flexed, Step-to pattern, Decreased weight shift to right, Decreased step length - right Gait velocity: decreased     General Gait Details: kept amb distance at a functional distance due to PWB.   Stairs Stairs: Yes Stairs assistance: Min assist Stair Management: No rails, Step to pattern, Backwards, With walker Number of Stairs: 1 General stair comments: up backward due to PWB.  Pt required 75% VC's on proper tech, proper walker placement and proper sequencing.  Performed twice.   Wheelchair Mobility    Modified Rankin (Stroke Patients Only)       Balance                                            Cognition Arousal/Alertness: Awake/alert Behavior During Therapy: WFL for tasks assessed/performed Overall Cognitive Status: Within Functional Limits for tasks assessed                                 General Comments: Patient recall PWB and needing verbal cues to recall 2/3 hip precautions        Exercises  Total Hip Replacement TE's following HEP Handout 10 reps ankle pumps 05 reps knee presses 05 reps heel slides  05 reps SAQ's 05 reps ABD Instructed how to use a belt loop to assist  Followed by ICE     General Comments        Pertinent Vitals/Pain Pain Assessment Pain Assessment: Faces Faces Pain Scale: Hurts a little bit Pain Location: R hip Pain Descriptors / Indicators: Tender Pain Intervention(s): Monitored during session, Repositioned, Ice applied    Home Living                          Prior Function            PT Goals (current goals can now be found in the care plan section)      Frequency    7X/week      PT Plan  Current plan remains appropriate    Co-evaluation              AM-PAC PT "6 Clicks" Mobility   Outcome Measure  Help needed turning from your back to your side while in a flat bed without using bedrails?: A Little Help needed moving from lying on your back to sitting on the side of a flat bed without using bedrails?: A Little Help needed moving to and from a bed to a chair (including a wheelchair)?: A Little Help needed standing up from a chair using your arms (e.g., wheelchair or bedside chair)?: A Little Help needed to walk in hospital room?: A Little Help needed climbing 3-5 steps with a railing? : A Little 6 Click Score: 18    End of Session Equipment Utilized During Treatment: Gait belt Activity Tolerance: Patient tolerated treatment well Patient left: in chair;with family/visitor present;Other (comment) Nurse Communication: Mobility status PT Visit Diagnosis: Other abnormalities of gait and mobility (R26.89);Muscle weakness (generalized) (M62.81)     Time: 1010-1038 PT Time Calculation (min) (ACUTE ONLY): 28 min  Charges:  $Gait Training: 8-22 mins $Therapeutic Exercise: 8-22 mins                     Rica Koyanagi  PTA Acute  Rehabilitation Services Pager      [phone] Office      [phone]

## 2021-03-22 NOTE — Discharge Summary (Signed)
Physician Discharge Summary  Patient ID: Brittany Santiago MRN: [id number] DOB/AGE: 07-03-1930 88 y.o.  Admit date: 03/20/2021 Discharge date: 03/22/2021  Admission Diagnoses:  Failed orthopedic implant, initial encounter [facility])  Discharge Diagnoses:  Principal Problem:   Failed orthopedic implant, initial encounter [facility])   Past Medical History:  Diagnosis Date   Atherosclerosis of coronary artery 03/16/2015   Coronary artery disease involving native coronary artery of native heart without angina pectoris 03/16/2015   Endometrial cancer ([place]) 05/27/2017   Enterocele 12/30/2017   Female bladder prolapse    GERD (gastroesophageal reflux disease)    Hyperlipidemia    Hypertension    Hypothyroidism    Left bundle branch block (LBBB) 03/04/2017   Menopausal symptom 05/29/2017   Mild aortic regurgitation 11/15/2019   Mild aortic stenosis 11/15/2019   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   Seasonal allergies    Thyroid disease    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 female who was admitted 03/20/2021 with a diagnosis of Failed orthopedic implant, initial encounter ([place]) and went to the operating room on 03/20/2021 and underwent the above named procedures.    She was given perioperative antibiotics:  Anti-infectives (From admission, onward)    Start     Dose/Rate Route Frequency Ordered Stop   03/20/21 1400  ceFAZolin (ANCEF) IVPB 2g/100 mL premix        2 g 200 mL/hr over 30 Minutes Intravenous Every 6 hours 03/20/21 1347 03/20/21 2021   03/20/21 0809  vancomycin (VANCOCIN) 1-5 GM/200ML-% IVPB       Note to Pharmacy: Georgena Spurling W: cabinet override      03/20/21 0809 03/20/21 2014   03/20/21 0600  ceFAZolin (ANCEF) IVPB 2g/100 mL premix        2 g 200 mL/hr over 30 Minutes  Intravenous On call to O.R. 03/20/21 8242 03/20/21 0735     .  Postoperatively, she was made 50% WB RLE with a walker. Posterior hip precautions.  She was given sequential compression devices, early ambulation, and ASA for DVT prophylaxis.  ON POD#1, she had symptomatic acute blood loss anemia with hgb 6.7. She was given 2 units PRBCs with resolution of symptoms, and her hgb increased to 11.8 Sh also complained of mild worsening of her chronic plantar R foot sensory change. MRI was ordered to rule out hematoma from the spinal. The MRI showed mild canal stenosis and worsening of R sided multilevel NFN. I advised her to follow up with her back doctor for consideration of repeat ESI.  She benefited maximally from the hospital stay and there were no complications.    Recent vital signs:  Vitals:   03/22/21 0949 03/22/21 1522  BP: (!) 137/55 (!) 155/54  Pulse: 68 65  Resp: 16 16  Temp: 98.4 F (36.9 C) 98.6 F (37 C)  SpO2: 97%     Recent laboratory studies:  Lab Results  Component Value Date   HGB 11.8 (L) 03/22/2021   HGB 6.7 (LL) 03/21/2021   HGB 13.2 03/13/2021   Lab Results  Component Value Date   WBC 12.7 (H) 03/22/2021   PLT 148 (L) 03/22/2021   Lab Results  Component Value Date   INR 1.0 02/15/2021   Lab Results  Component Value Date   NA 131 (L) 03/21/2021  K 4.4 03/21/2021   CL 105 03/21/2021   CO2 21 (L) 03/21/2021   BUN 23 03/21/2021   CREATININE 1.43 (H) 03/21/2021   GLUCOSE 148 (H) 03/21/2021     WEIGHT BEARING   Weight bearing as tolerated with assist device (walker, cane, etc) as directed, use it as long as suggested by your surgeon or therapist, typically at least 4-6 weeks.   EXERCISES  Results after joint replacement surgery are often greatly improved when you follow the exercise, range of motion and muscle strengthening exercises prescribed by your doctor. Safety measures are also important to protect the joint from further injury. Any time  any of these exercises cause you to have increased pain or swelling, decrease what you are doing until you are comfortable again and then slowly increase them. If you have problems or questions, call your caregiver or physical therapist for advice.   Rehabilitation is important following a joint replacement. After just a few days of immobilization, the muscles of the leg can become weakened and shrink (atrophy).  These exercises are designed to build up the tone and strength of the thigh and leg muscles and to improve motion. Often times heat used for twenty to thirty minutes before working out will loosen up your tissues and help with improving the range of motion but do not use heat for the first two weeks following surgery (sometimes heat can increase post-operative swelling).   These exercises can be done on a training (exercise) mat, on the floor, on a table or on a bed. Use whatever works the best and is most comfortable for you.    Use music or television while you are exercising so that the exercises are a pleasant break in your day. This will make your life better with the exercises acting as a break in your routine that you can look forward to.   Perform all exercises about fifteen times, three times per day or as directed.  You should exercise both the operative leg and the other leg as well.  Exercises include:   Quad Sets - Tighten up the muscle on the front of the thigh (Quad) and hold for 5-10 seconds.   Straight Leg Raises - With your knee straight (if you were given a brace, keep it on), lift the leg to 60 degrees, hold for 3 seconds, and slowly lower the leg.  Perform this exercise against resistance later as your leg gets stronger.  Leg Slides: Lying on your back, slowly slide your foot toward your buttocks, bending your knee up off the floor (only go as far as is comfortable). Then slowly slide your foot back down until your leg is flat on the floor again.  Angel Wings: Lying on your  back spread your legs to the side as far apart as you can without causing discomfort.  Hamstring Strength:  Lying on your back, push your heel against the floor with your leg straight by tightening up the muscles of your buttocks.  Repeat, but this time bend your knee to a comfortable angle, and push your heel against the floor.  You may put a pillow under the heel to make it more comfortable if necessary.   A rehabilitation program following joint replacement surgery can speed recovery and prevent re-injury in the future due to weakened muscles. Contact your doctor or a physical therapist for more information on knee rehabilitation.    CONSTIPATION  Constipation is defined medically as fewer than three stools per week and  severe constipation as less than one stool per week.  Even if you have a regular bowel pattern at home, your normal regimen is likely to be disrupted due to multiple reasons following surgery.  Combination of anesthesia, postoperative narcotics, change in appetite and fluid intake all can affect your bowels.   YOU MUST use at least one of the following options; they are listed in order of increasing strength to get the job done.  They are all available over the counter, and you may need to use some, POSSIBLY even all of these options:    Drink plenty of fluids (prune juice may be helpful) and high fiber foods Colace 100 mg by mouth twice a day  Senokot for constipation as directed and as needed Dulcolax (bisacodyl), take with full glass of water  Miralax (polyethylene glycol) once or twice a day as needed.  If you have tried all these things and are unable to have a bowel movement in the first 3-4 days after surgery call either your surgeon or your primary doctor.    If you experience loose stools or diarrhea, hold the medications until you stool forms back up.  If your symptoms do not get better within 1 week or if they get worse, check with your doctor.  If you experience "the  worst abdominal pain ever" or develop nausea or vomiting, please contact the office immediately for further recommendations for treatment.   ITCHING:  If you experience itching with your medications, try taking only a single pain pill, or even half a pain pill at a time.  You can also use Benadryl over the counter for itching or also to help with sleep.   TED HOSE STOCKINGS:  Use stockings on both legs until for at least 2 weeks or as directed by physician office. They may be removed at night for sleeping.  MEDICATIONS:  See your medication summary on the After Visit Summary that nursing will review with you.  You may have some home medications which will be placed on hold until you complete the course of blood thinner medication.  It is important for you to complete the blood thinner medication as prescribed.  PRECAUTIONS:  If you experience chest pain or shortness of breath - call 911 immediately for transfer to the hospital emergency department.   If you develop a fever greater that 101 F, purulent drainage from wound, increased redness or drainage from wound, foul odor from the wound/dressing, or calf pain - CONTACT YOUR SURGEON.                                                   FOLLOW-UP APPOINTMENTS:  If you do not already have a post-op appointment, please call the office for an appointment to be seen by your surgeon.  Guidelines for how soon to be seen are listed in your After Visit Summary, but are typically between 1-4 weeks after surgery.  OTHER INSTRUCTIONS:   Knee Replacement:  Do not place pillow under knee, focus on keeping the knee straight while resting. CPM instructions: 0-90 degrees, 2 hours in the morning, 2 hours in the afternoon, and 2 hours in the evening. Place foam block, curve side up under heel at all times except when in CPM or when walking.  DO NOT modify, tear, cut, or change the foam block in any  way.   MAKE SURE YOU:  Understand these instructions.  Get help  right away if you are not doing well or get worse.    Thank you for letting us be a part of your medical care team.  It is a privilege we respect greatly.  We hope these instructions will help you stay on track for a fast and full recovery!   Diagnostic Studies: MR LUMBAR SPINE WO CONTRAST  Result Date: 03/21/2021 CLINICAL DATA:  Low back pain.  Right sciatic weakness. EXAM: MRI LUMBAR SPINE WITHOUT CONTRAST TECHNIQUE: Multiplanar, multisequence MR imaging of the lumbar spine was performed. No intravenous contrast was administered. COMPARISON:  11/01/2010 FINDINGS: Segmentation:  Standard Alignment: Grade 1 retrolisthesis at L3-4 and grade 1 anterolisthesis at L4-5. Vertebrae:  No fracture, evidence of discitis, or bone lesion. Conus medullaris and cauda equina: Conus extends to the L2 level. Conus and cauda equina appear normal. Paraspinal and other soft tissues: Negative Disc levels: L1-L2: Normal disc space and facet joints. No spinal canal stenosis. No neural foraminal stenosis. L2-L3: Slight disc bulge. No spinal canal stenosis. No neural foraminal stenosis. L3-L4: Small left asymmetric disc bulge. Left lateral recess narrowing without central spinal canal stenosis. Progression of moderate left neural foraminal stenosis. L4-L5: Moderate facet hypertrophy with small disc bulge. Mild spinal canal stenosis. Worsened severe bilateral neural foraminal stenosis. L5-S1: Small right asymmetric disc bulge with mild facet hypertrophy, slightly progressed. No spinal canal stenosis. Unchanged moderate right neural foraminal stenosis. Visualized sacrum: Normal. IMPRESSION: 1. Worsened severe bilateral L4-5 neural foraminal stenosis and mild spinal canal stenosis. 2. Progression of moderate left L3-4 neural foraminal stenosis. 3. Unchanged moderate right L5-S1 neural foraminal stenosis. Electronically Signed   By: Ulyses Jarred M.D.   On: 03/21/2021 23:26   DG Pelvis Portable  Result Date: 03/20/2021 CLINICAL  DATA:  Status post right total hip replacement. EXAM: PORTABLE PELVIS 1-2 VIEWS COMPARISON:  Fluoroscopic images of same day. FINDINGS: Right femoral and acetabular components are well situated. Expected postoperative changes are seen in the surrounding soft tissues. IMPRESSION: Status post right total hip arthroplasty. Electronically Signed   By: Marijo Conception M.D.   On: 03/20/2021 12:29   DG C-Arm 1-60 Min-No Report  Result Date: 03/20/2021 Fluoroscopy was utilized by the requesting physician.  No radiographic interpretation.   DG C-Arm 1-60 Min-No Report  Result Date: 03/20/2021 Fluoroscopy was utilized by the requesting physician.  No radiographic interpretation.   DG C-Arm 1-60 Min-No Report  Result Date: 03/20/2021 Fluoroscopy was utilized by the requesting physician.  No radiographic interpretation.   DG HIP OPERATIVE UNILAT W OR W/O PELVIS RIGHT  Result Date: 03/20/2021 CLINICAL DATA:  Convergent 2 right total hip arthroplasty. EXAM: OPERATIVE right HIP (WITH PELVIS IF PERFORMED) 2 VIEWS TECHNIQUE: Fluoroscopic spot image(s) were submitted for interpretation post-operatively. Radiation exposure index: 3.9406 mGy. COMPARISON:  December 18, 2020. FINDINGS: Two intraoperative fluoroscopic images were obtained of the right hip. Right femoral and acetabular components appear to be well situated. IMPRESSION: Fluoroscopic guidance provided during right total hip arthroplasty. Electronically Signed   By: Marijo Conception M.D.   On: 03/20/2021 10:43    Disposition: Discharge disposition: 01-Home or Self Care       Discharge Instructions     Call MD / Call 911   Complete by: As directed    If you experience chest pain or shortness of breath, CALL 911 and be transported to the hospital emergency room.  If you develope a fever above  11 F, pus (white drainage) or increased drainage or redness at the wound, or calf pain, call your surgeon's office.   Constipation Prevention   Complete by: As  directed    Drink plenty of fluids.  Prune juice may be helpful.  You may use a stool softener, such as Colace (over the counter) 100 mg twice a day.  Use MiraLax (over the counter) for constipation as needed.   Diet - low sodium heart healthy   Complete by: As directed    Discharge instructions   Complete by: As directed    50% weight bearing right leg Posterior hip precautions   Driving restrictions   Complete by: As directed    No driving for 12 weeks   Follow the hip precautions as taught in Physical Therapy   Complete by: As directed    Increase activity slowly as tolerated   Complete by: As directed    Lifting restrictions   Complete by: As directed    No lifting for 12 weeks   Post-operative opioid taper instructions:   Complete by: As directed    POST-OPERATIVE OPIOID TAPER INSTRUCTIONS: It is important to wean off of your opioid medication as soon as possible. If you do not need pain medication after your surgery it is ok to stop day one. Opioids include: Codeine, Hydrocodone(Norco, Vicodin), Oxycodone(Percocet, oxycontin) and hydromorphone amongst others.  Long term and even short term use of opiods can cause: Increased pain response Dependence Constipation Depression Respiratory depression And more.  Withdrawal symptoms can include Flu like symptoms Nausea, vomiting And more Techniques to manage these symptoms Hydrate well Eat regular healthy meals Stay active Use relaxation techniques(deep breathing, meditating, yoga) Do Not substitute Alcohol to help with tapering If you have been on opioids for less than two weeks and do not have pain than it is ok to stop all together.  Plan to wean off of opioids This plan should start within one week post op of your joint replacement. Maintain the same interval or time between taking each dose and first decrease the dose.  Cut the total daily intake of opioids by one tablet each day Next start to increase the time  between doses. The last dose that should be eliminated is the evening dose.      TED hose   Complete by: As directed    Use stockings (TED hose) for 2 weeks on both leg(s).  You may remove them at night for sleeping.        Follow-up Information     Rod Can, MD. Go on 04/02/2021.   Specialty: Orthopedic Surgery Why: You are scheduled for a follow up appointment on 04-02-21 at 2:00 pm. Contact information: [address] [place] [address] [phone]                  Signed: Hilton Cork Ansleigh Safer 04/02/2021, 6:30 PM

## 2021-03-22 NOTE — Progress Notes (Signed)
Discharge teaching and paperwork complete.  Questions answered.  Family at bedside.  Patient wheeled down to vehicle and care relinquished.

## 2021-03-22 NOTE — Plan of Care (Signed)
°  Problem: Activity: Goal: Ability to avoid complications of mobility impairment will improve Outcome: Progressing Goal: Ability to tolerate increased activity will improve Outcome: Progressing   Problem: Pain Management: Goal: Pain level will decrease with appropriate interventions Outcome: Progressing   Problem: Pain Managment: Goal: General experience of comfort will improve Outcome: Progressing

## 2021-03-22 NOTE — Progress Notes (Signed)
Occupational Therapy Treatment Patient Details Name: Brittany Santiago MRN: [id number] DOB: 1930-08-26 Today's Date: 03/22/2021   History of present illness 88 yo female s/p conversion of previous hemiarthroplasty to R DA-THA on 1/25. PMH includes HTN, HLD, endometrial cancer, CAD with history of CABG, asthma, LBBB, R THA.   OT comments  Patient seated in chair upon arrival. Able to recall 1/3 precautions without cue and remainder with min cues. Maintained PWB throughout session. Patient min G for transfer on/off toilet and utilized grab bar when powering up to standing. Did not need physical assist to manage mesh underwear or peri care after voiding. Demonstrates fair standing balance at sink to wash face/hands. Progressing well towards acute OT goals.    Recommendations for follow up therapy are one component of a multi-disciplinary discharge planning process, led by the attending physician.  Recommendations may be updated based on patient status, additional functional criteria and insurance authorization.    Follow Up Recommendations  No OT follow up    Assistance Recommended at Discharge Intermittent Supervision/Assistance  Patient can return home with the following  A little help with walking and/or transfers;A little help with bathing/dressing/bathroom;Assistance with cooking/housework   Equipment Recommendations  None recommended by OT       Precautions / Restrictions Precautions Precautions: Posterior Hip;Fall Precaution Booklet Issued: Yes (comment) Precaution Comments: THP/PWB Restrictions Weight Bearing Restrictions: Yes RLE Weight Bearing: Partial weight bearing RLE Partial Weight Bearing Percentage or Pounds: 50       Mobility Bed Mobility               General bed mobility comments: in chair       Balance Overall balance assessment: Needs assistance Sitting-balance support: Feet supported Sitting balance-Leahy Scale: Good     Standing balance support:  No upper extremity supported, During functional activity Standing balance-Leahy Scale: Fair Standing balance comment: while washing face and sink                           ADL either performed or assessed with clinical judgement   ADL Overall ADL's : Needs assistance/impaired Eating/Feeding: Independent   Grooming: Wash/dry face;Supervision/safety;Standing                   Toilet Transfer: Min guard;Ambulation;Rolling walker (2 wheels) Toilet Transfer Details (indicate cue type and reason): Patient able to power up to standing from toilet using grab bar to assist, min G for safety Toileting- Clothing Manipulation and Hygiene: Supervision/safety;Sit to/from stand       Functional mobility during ADLs: Min guard;Rolling walker (2 wheels)        Cognition Arousal/Alertness: Awake/alert Behavior During Therapy: WFL for tasks assessed/performed Overall Cognitive Status: Within Functional Limits for tasks assessed                                 General Comments: Patient recall PWB and needing verbal cues to recall 2/3 hip precautions                   Pertinent Vitals/ Pain       Pain Assessment Pain Assessment: No/denies pain         Frequency  Min 2X/week        Progress Toward Goals  OT Goals(current goals can now be found in the care plan section)  Progress towards OT goals: Progressing toward goals  Acute Rehab  OT Goals Patient Stated Goal: home today OT Goal Formulation: With patient Time For Goal Achievement: 04/04/21 Potential to Achieve Goals: Good ADL Goals Pt Will Perform Lower Body Dressing: with modified independence;sit to/from stand;sitting/lateral leans;with adaptive equipment Pt Will Transfer to Toilet: with modified independence;ambulating (walker) Pt Will Perform Toileting - Clothing Manipulation and hygiene: with modified independence;sitting/lateral leans;sit to/from stand  Plan Discharge plan needs to  be updated       AM-PAC OT "6 Clicks" Daily Activity     Outcome Measure   Help from another person eating meals?: None Help from another person taking care of personal grooming?: A Little Help from another person toileting, which includes using toliet, bedpan, or urinal?: A Little Help from another person bathing (including washing, rinsing, drying)?: A Lot Help from another person to put on and taking off regular upper body clothing?: A Little Help from another person to put on and taking off regular lower body clothing?: A Lot 6 Click Score: 17    End of Session Equipment Utilized During Treatment: Rolling walker (2 wheels)  OT Visit Diagnosis: Other abnormalities of gait and mobility (R26.89);Pain Pain - Right/Left: Right Pain - part of body: Hip   Activity Tolerance Patient tolerated treatment well   Patient Left in chair;with call bell/phone within reach   Nurse Communication Mobility status        Time: 9292-4462 OT Time Calculation (min): 19 min  Charges: OT General Charges $OT Visit: 1 Visit OT Treatments $Self Care/Home Management : 8-22 mins  Delbert Phenix OT OT pager: [phone]   Rosemary Holms 03/22/2021, 12:06 PM

## 2021-04-02 DIAGNOSIS — Z96641 Presence of right artificial hip joint: Secondary | ICD-10-CM | POA: Diagnosis not present

## 2021-04-02 DIAGNOSIS — Z471 Aftercare following joint replacement surgery: Secondary | ICD-10-CM | POA: Diagnosis not present

## 2021-04-02 DIAGNOSIS — Z4789 Encounter for other orthopedic aftercare: Secondary | ICD-10-CM | POA: Diagnosis not present

## 2021-04-11 ENCOUNTER — Ambulatory Visit: Payer: Medicare Other | Admitting: Podiatry

## 2021-04-11 ENCOUNTER — Ambulatory Visit (INDEPENDENT_AMBULATORY_CARE_PROVIDER_SITE_OTHER): Payer: Medicare Other | Admitting: Podiatry

## 2021-04-11 ENCOUNTER — Other Ambulatory Visit: Payer: Self-pay

## 2021-04-11 ENCOUNTER — Ambulatory Visit (INDEPENDENT_AMBULATORY_CARE_PROVIDER_SITE_OTHER): Payer: Medicare Other

## 2021-04-11 DIAGNOSIS — G5791 Unspecified mononeuropathy of right lower limb: Secondary | ICD-10-CM

## 2021-04-11 MED ORDER — GABAPENTIN 300 MG PO CAPS: 300.0000 mg | ORAL_CAPSULE | Freq: Three times a day (TID) | ORAL | 0 refills | Status: DC | PRN

## 2021-04-12 ENCOUNTER — Telehealth: Payer: Self-pay | Admitting: Podiatry

## 2021-04-12 NOTE — Telephone Encounter (Signed)
Waiting for notes from visit on 04/11/21

## 2021-04-12 NOTE — Telephone Encounter (Signed)
Fax number [phone] - Dr Delfino Lovett Sandi Mealy    Patient needs office notes from yesterday's visit faxed to her surgeon office today, if possible.

## 2021-04-12 NOTE — Telephone Encounter (Signed)
Please advise 

## 2021-04-15 NOTE — Progress Notes (Signed)
°  Subjective:  Patient ID: Brittany Santiago, female    DOB: Jun 30, 1930,  MRN: [id number]  Chief Complaint  Patient presents with   Foot Pain    rt heel pain-recent hip surgery    88 y.o. female presents with the above complaint. History confirmed with patient.  She recently underwent right hip surgery on 03/20/2021.  Her hip is feeling fine but she has had numb and tingling pain in her foot since then.  She has not had issues with her heel or pain like this prior to her surgery.  She says it started immediately after.  Her son got her new shoes that have memory foam and cushioning and this is helped some.  She is walking slowly with a walker.  No history of Planter fasciitis that she knows of.  Objective:  Physical Exam: warm, good capillary refill, no trophic changes or ulcerative lesions, and normal DP and PT pulses. Left Foot: normal exam, no swelling, tenderness, instability; ligaments intact, full range of motion of all ankle/foot joints Right Foot: She has pain over the heel pad and plantar foot, she has decreased sensation to monofilament on the right side compared to the left side.  She does have pain with compression of the tibial nerve as well.  No pain with range of motion of the digits or compression of the plantar fascia in the mid substance    Radiographs: Multiple views x-ray of the right foot: no fracture, dislocation, swelling or degenerative changes noted, there is no plantar heel spur Assessment:   1. Neuritis of right lower extremity      Plan:  Patient was evaluated and treated and all questions answered.  I reviewed my clinical findings with the patient and her daughter in detail.  We reviewed her radiographs taken today in the office.  I am doubtful that this is plantar fasciitis.  Discussed with her that I think this is likely neuritis throughout her right lower extremity.  Could be related to prior surgery, hopefully will resolve with some time.  She had marked  sensory changes compared to her contralateral limb in the foot.  The pain on compression of the tibial nerve also would not be present with Planter fasciitis as well.  I discussed with her I could consider a steroid injection of the heel for some relief to see if this is plantar fasciitis but I do not think this is likely to offer her much long-term benefit.  In the interim we discussed symptomatic control with medications and I prescribed her gabapentin 300 mg up to 3 times daily and discussed its use and possible side effects.  She has a prescription for Flexeril from her hip surgery as well and she inquired if this may help and I think it could also give her some relief.  She will return to see me as needed.  Return if symptoms worsen or fail to improve.

## 2021-04-15 NOTE — Telephone Encounter (Signed)
Faxed LOv notes to Dr Noralyn Pick office per patient's request, 04/15/21

## 2021-04-29 DIAGNOSIS — Z471 Aftercare following joint replacement surgery: Secondary | ICD-10-CM | POA: Diagnosis not present

## 2021-04-29 DIAGNOSIS — Z96641 Presence of right artificial hip joint: Secondary | ICD-10-CM | POA: Diagnosis not present

## 2021-04-29 DIAGNOSIS — G629 Polyneuropathy, unspecified: Secondary | ICD-10-CM

## 2021-04-29 DIAGNOSIS — Z4789 Encounter for other orthopedic aftercare: Secondary | ICD-10-CM | POA: Diagnosis not present

## 2021-04-29 HISTORY — DX: Polyneuropathy, unspecified: G62.9

## 2021-05-11 DIAGNOSIS — Z20822 Contact with and (suspected) exposure to covid-19: Secondary | ICD-10-CM | POA: Diagnosis not present

## 2021-05-11 DIAGNOSIS — N3000 Acute cystitis without hematuria: Secondary | ICD-10-CM | POA: Diagnosis not present

## 2021-05-11 DIAGNOSIS — M25551 Pain in right hip: Secondary | ICD-10-CM | POA: Diagnosis not present

## 2021-05-11 DIAGNOSIS — A419 Sepsis, unspecified organism: Secondary | ICD-10-CM | POA: Diagnosis not present

## 2021-05-11 DIAGNOSIS — S299XXA Unspecified injury of thorax, initial encounter: Secondary | ICD-10-CM | POA: Diagnosis not present

## 2021-05-11 DIAGNOSIS — S72001A Fracture of unspecified part of neck of right femur, initial encounter for closed fracture: Secondary | ICD-10-CM | POA: Diagnosis not present

## 2021-05-11 DIAGNOSIS — M9701XA Periprosthetic fracture around internal prosthetic right hip joint, initial encounter: Secondary | ICD-10-CM | POA: Diagnosis not present

## 2021-05-11 DIAGNOSIS — I517 Cardiomegaly: Secondary | ICD-10-CM | POA: Diagnosis not present

## 2021-05-11 DIAGNOSIS — I10 Essential (primary) hypertension: Secondary | ICD-10-CM | POA: Diagnosis not present

## 2021-05-12 ENCOUNTER — Encounter (HOSPITAL_COMMUNITY): Payer: Self-pay | Admitting: Internal Medicine

## 2021-05-12 ENCOUNTER — Inpatient Hospital Stay (HOSPITAL_COMMUNITY)
Admission: AD | Admit: 2021-05-12 | Discharge: 2021-05-16 | DRG: 536 | Disposition: A | Discharge status: Skilled Nursing Facility | Payer: Medicare Other | Source: Other Acute Inpatient Hospital | Attending: Internal Medicine | Admitting: Internal Medicine

## 2021-05-12 ENCOUNTER — Other Ambulatory Visit: Payer: Self-pay

## 2021-05-12 ENCOUNTER — Observation Stay (HOSPITAL_COMMUNITY): Payer: Medicare Other

## 2021-05-12 DIAGNOSIS — N1832 Chronic kidney disease, stage 3b: Secondary | ICD-10-CM | POA: Diagnosis not present

## 2021-05-12 DIAGNOSIS — K219 Gastro-esophageal reflux disease without esophagitis: Secondary | ICD-10-CM

## 2021-05-12 DIAGNOSIS — E860 Dehydration: Secondary | ICD-10-CM | POA: Diagnosis not present

## 2021-05-12 DIAGNOSIS — E785 Hyperlipidemia, unspecified: Secondary | ICD-10-CM | POA: Diagnosis not present

## 2021-05-12 DIAGNOSIS — Z9181 History of falling: Secondary | ICD-10-CM | POA: Diagnosis not present

## 2021-05-12 DIAGNOSIS — Z8249 Family history of ischemic heart disease and other diseases of the circulatory system: Secondary | ICD-10-CM | POA: Diagnosis not present

## 2021-05-12 DIAGNOSIS — S72001A Fracture of unspecified part of neck of right femur, initial encounter for closed fracture: Principal | ICD-10-CM

## 2021-05-12 DIAGNOSIS — I2510 Atherosclerotic heart disease of native coronary artery without angina pectoris: Secondary | ICD-10-CM | POA: Diagnosis present

## 2021-05-12 DIAGNOSIS — Z79890 Hormone replacement therapy (postmenopausal): Secondary | ICD-10-CM

## 2021-05-12 DIAGNOSIS — Z9841 Cataract extraction status, right eye: Secondary | ICD-10-CM

## 2021-05-12 DIAGNOSIS — Z96641 Presence of right artificial hip joint: Secondary | ICD-10-CM | POA: Diagnosis present

## 2021-05-12 DIAGNOSIS — E039 Hypothyroidism, unspecified: Secondary | ICD-10-CM | POA: Diagnosis present

## 2021-05-12 DIAGNOSIS — I351 Nonrheumatic aortic (valve) insufficiency: Secondary | ICD-10-CM | POA: Diagnosis not present

## 2021-05-12 DIAGNOSIS — M79671 Pain in right foot: Secondary | ICD-10-CM

## 2021-05-12 DIAGNOSIS — S72141A Displaced intertrochanteric fracture of right femur, initial encounter for closed fracture: Secondary | ICD-10-CM | POA: Diagnosis not present

## 2021-05-12 DIAGNOSIS — Z20822 Contact with and (suspected) exposure to covid-19: Secondary | ICD-10-CM | POA: Diagnosis not present

## 2021-05-12 DIAGNOSIS — Z9842 Cataract extraction status, left eye: Secondary | ICD-10-CM

## 2021-05-12 DIAGNOSIS — M247 Protrusio acetabuli: Secondary | ICD-10-CM | POA: Diagnosis not present

## 2021-05-12 DIAGNOSIS — C541 Malignant neoplasm of endometrium: Secondary | ICD-10-CM | POA: Diagnosis not present

## 2021-05-12 DIAGNOSIS — I129 Hypertensive chronic kidney disease with stage 1 through stage 4 chronic kidney disease, or unspecified chronic kidney disease: Secondary | ICD-10-CM | POA: Diagnosis present

## 2021-05-12 DIAGNOSIS — I517 Cardiomegaly: Secondary | ICD-10-CM | POA: Diagnosis not present

## 2021-05-12 DIAGNOSIS — S72009A Fracture of unspecified part of neck of unspecified femur, initial encounter for closed fracture: Secondary | ICD-10-CM | POA: Diagnosis present

## 2021-05-12 DIAGNOSIS — Z7401 Bed confinement status: Secondary | ICD-10-CM | POA: Diagnosis not present

## 2021-05-12 DIAGNOSIS — N390 Urinary tract infection, site not specified: Secondary | ICD-10-CM

## 2021-05-12 DIAGNOSIS — Z8542 Personal history of malignant neoplasm of other parts of uterus: Secondary | ICD-10-CM

## 2021-05-12 DIAGNOSIS — M79672 Pain in left foot: Secondary | ICD-10-CM | POA: Diagnosis present

## 2021-05-12 DIAGNOSIS — M25551 Pain in right hip: Secondary | ICD-10-CM | POA: Insufficient documentation

## 2021-05-12 DIAGNOSIS — R2681 Unsteadiness on feet: Secondary | ICD-10-CM | POA: Diagnosis not present

## 2021-05-12 DIAGNOSIS — Z9889 Other specified postprocedural states: Secondary | ICD-10-CM | POA: Diagnosis not present

## 2021-05-12 DIAGNOSIS — R279 Unspecified lack of coordination: Secondary | ICD-10-CM | POA: Diagnosis not present

## 2021-05-12 DIAGNOSIS — Z806 Family history of leukemia: Secondary | ICD-10-CM | POA: Diagnosis not present

## 2021-05-12 DIAGNOSIS — S72111A Displaced fracture of greater trochanter of right femur, initial encounter for closed fracture: Secondary | ICD-10-CM | POA: Diagnosis not present

## 2021-05-12 DIAGNOSIS — M9701XA Periprosthetic fracture around internal prosthetic right hip joint, initial encounter: Secondary | ICD-10-CM | POA: Diagnosis not present

## 2021-05-12 DIAGNOSIS — I10 Essential (primary) hypertension: Secondary | ICD-10-CM | POA: Diagnosis not present

## 2021-05-12 DIAGNOSIS — R200 Anesthesia of skin: Secondary | ICD-10-CM | POA: Diagnosis not present

## 2021-05-12 DIAGNOSIS — W010XXA Fall on same level from slipping, tripping and stumbling without subsequent striking against object, initial encounter: Secondary | ICD-10-CM | POA: Diagnosis present

## 2021-05-12 DIAGNOSIS — Z8051 Family history of malignant neoplasm of kidney: Secondary | ICD-10-CM | POA: Diagnosis not present

## 2021-05-12 DIAGNOSIS — Y929 Unspecified place or not applicable: Secondary | ICD-10-CM | POA: Diagnosis not present

## 2021-05-12 DIAGNOSIS — R404 Transient alteration of awareness: Secondary | ICD-10-CM | POA: Diagnosis not present

## 2021-05-12 DIAGNOSIS — Z7952 Long term (current) use of systemic steroids: Secondary | ICD-10-CM | POA: Diagnosis not present

## 2021-05-12 DIAGNOSIS — Z79899 Other long term (current) drug therapy: Secondary | ICD-10-CM | POA: Diagnosis not present

## 2021-05-12 DIAGNOSIS — S299XXA Unspecified injury of thorax, initial encounter: Secondary | ICD-10-CM | POA: Diagnosis not present

## 2021-05-12 DIAGNOSIS — M7989 Other specified soft tissue disorders: Secondary | ICD-10-CM | POA: Diagnosis present

## 2021-05-12 DIAGNOSIS — R531 Weakness: Secondary | ICD-10-CM | POA: Diagnosis not present

## 2021-05-12 DIAGNOSIS — Z951 Presence of aortocoronary bypass graft: Secondary | ICD-10-CM

## 2021-05-12 DIAGNOSIS — A419 Sepsis, unspecified organism: Secondary | ICD-10-CM | POA: Diagnosis not present

## 2021-05-12 DIAGNOSIS — D638 Anemia in other chronic diseases classified elsewhere: Secondary | ICD-10-CM

## 2021-05-12 DIAGNOSIS — M6281 Muscle weakness (generalized): Secondary | ICD-10-CM | POA: Diagnosis not present

## 2021-05-12 DIAGNOSIS — E782 Mixed hyperlipidemia: Secondary | ICD-10-CM | POA: Diagnosis not present

## 2021-05-12 DIAGNOSIS — N3000 Acute cystitis without hematuria: Secondary | ICD-10-CM | POA: Diagnosis not present

## 2021-05-12 DIAGNOSIS — R41841 Cognitive communication deficit: Secondary | ICD-10-CM | POA: Diagnosis not present

## 2021-05-12 DIAGNOSIS — S728X1D Other fracture of right femur, subsequent encounter for closed fracture with routine healing: Secondary | ICD-10-CM | POA: Diagnosis not present

## 2021-05-12 DIAGNOSIS — M9701XD Periprosthetic fracture around internal prosthetic right hip joint, subsequent encounter: Secondary | ICD-10-CM | POA: Diagnosis not present

## 2021-05-12 HISTORY — DX: Fracture of unspecified part of neck of right femur, initial encounter for closed fracture: S72.001A

## 2021-05-12 HISTORY — DX: Urinary tract infection, site not specified: N39.0

## 2021-05-12 HISTORY — DX: Pain in right hip: M25.551

## 2021-05-12 LAB — COMPREHENSIVE METABOLIC PANEL
ALT: 18 U/L (ref 0–44)
AST: 27 U/L (ref 15–41)
Albumin: 3.3 g/dL — ABNORMAL LOW (ref 3.5–5.0)
Alkaline Phosphatase: 51 U/L (ref 38–126)
Anion gap: 7 (ref 5–15)
BUN: 37 mg/dL — ABNORMAL HIGH (ref 8–23)
CO2: 25 mmol/L (ref 22–32)
Calcium: 8.7 mg/dL — ABNORMAL LOW (ref 8.9–10.3)
Chloride: 102 mmol/L (ref 98–111)
Creatinine, Ser: 1.5 mg/dL — ABNORMAL HIGH (ref 0.44–1.00)
GFR, Estimated: 33 mL/min — ABNORMAL LOW (ref >60)
Glucose, Bld: 100 mg/dL — ABNORMAL HIGH (ref 70–99)
Potassium: 4.6 mmol/L (ref 3.5–5.1)
Sodium: 134 mmol/L — ABNORMAL LOW (ref 135–145)
Total Bilirubin: 0.6 mg/dL (ref 0.3–1.2)
Total Protein: 6.5 g/dL (ref 6.5–8.1)

## 2021-05-12 LAB — CBC WITH DIFFERENTIAL/PLATELET
Abs Immature Granulocytes: 0.02 10*3/uL (ref 0.00–0.07)
Basophils Absolute: 0 10*3/uL (ref 0.0–0.1)
Basophils Relative: 0 %
Eosinophils Absolute: 0.2 10*3/uL (ref 0.0–0.5)
Eosinophils Relative: 2 %
HCT: 28.1 % — ABNORMAL LOW (ref 36.0–46.0)
Hemoglobin: 9.3 g/dL — ABNORMAL LOW (ref 12.0–15.0)
Immature Granulocytes: 0 %
Lymphocytes Relative: 29 %
Lymphs Abs: 2.2 10*3/uL (ref 0.7–4.0)
MCH: 30.5 pg (ref 26.0–34.0)
MCHC: 33.1 g/dL (ref 30.0–36.0)
MCV: 92.1 fL (ref 80.0–100.0)
Monocytes Absolute: 1.2 10*3/uL — ABNORMAL HIGH (ref 0.1–1.0)
Monocytes Relative: 15 %
Neutro Abs: 4.2 10*3/uL (ref 1.7–7.7)
Neutrophils Relative %: 54 %
Platelets: 196 10*3/uL (ref 150–400)
RBC: 3.05 MIL/uL — ABNORMAL LOW (ref 3.87–5.11)
RDW: 15.7 % — ABNORMAL HIGH (ref 11.5–15.5)
WBC: 7.8 10*3/uL (ref 4.0–10.5)
nRBC: 0 % (ref 0.0–0.2)

## 2021-05-12 LAB — LACTIC ACID, PLASMA: Lactic Acid, Venous: 1.2 mmol/L (ref 0.5–1.9)

## 2021-05-12 MED ORDER — ENOXAPARIN SODIUM 30 MG/0.3ML IJ SOSY
30.0000 mg | PREFILLED_SYRINGE | INTRAMUSCULAR | Status: DC
  Administered 2021-05-12 – 2021-05-15 (×4): 30 mg via SUBCUTANEOUS
  Filled 2021-05-12 (×4): qty 0.3

## 2021-05-12 MED ORDER — PANTOPRAZOLE SODIUM 40 MG PO TBEC
40.0000 mg | DELAYED_RELEASE_TABLET | Freq: Every day | ORAL | Status: DC
  Administered 2021-05-12 – 2021-05-15 (×4): 40 mg via ORAL
  Filled 2021-05-12 (×5): qty 1

## 2021-05-12 MED ORDER — SODIUM CHLORIDE 0.9 % IV SOLN
1.0000 g | INTRAVENOUS | Status: DC
  Administered 2021-05-12 – 2021-05-13 (×2): 1 g via INTRAVENOUS
  Filled 2021-05-12 (×3): qty 10

## 2021-05-12 MED ORDER — LEVOTHYROXINE SODIUM 75 MCG PO TABS
75.0000 ug | ORAL_TABLET | Freq: Every day | ORAL | Status: DC
  Administered 2021-05-13 – 2021-05-16 (×4): 75 ug via ORAL
  Filled 2021-05-12 (×4): qty 1

## 2021-05-12 MED ORDER — HYDROCODONE-ACETAMINOPHEN 5-325 MG PO TABS
1.0000 | ORAL_TABLET | Freq: Four times a day (QID) | ORAL | Status: DC | PRN
  Administered 2021-05-12: 1 via ORAL
  Administered 2021-05-13: 2 via ORAL
  Administered 2021-05-13 (×2): 1 via ORAL
  Administered 2021-05-13 – 2021-05-16 (×6): 2 via ORAL
  Filled 2021-05-12: qty 1
  Filled 2021-05-12 (×3): qty 2
  Filled 2021-05-12: qty 1
  Filled 2021-05-12 (×3): qty 2
  Filled 2021-05-12: qty 1
  Filled 2021-05-12: qty 2

## 2021-05-12 MED ORDER — KETOTIFEN FUMARATE 0.025 % OP SOLN: 1.0000 [drp] | Freq: Every day | OPHTHALMIC | Status: DC | PRN

## 2021-05-12 MED ORDER — FUROSEMIDE 40 MG PO TABS
40.0000 mg | ORAL_TABLET | Freq: Every day | ORAL | Status: DC
  Administered 2021-05-13: 40 mg via ORAL
  Filled 2021-05-12: qty 1

## 2021-05-12 MED ORDER — ASPIRIN EC 81 MG PO TBEC
81.0000 mg | DELAYED_RELEASE_TABLET | Freq: Every day | ORAL | Status: DC
Start: 2021-05-12 — End: 2021-05-16
  Administered 2021-05-12 – 2021-05-15 (×4): 81 mg via ORAL
  Filled 2021-05-12 (×5): qty 1

## 2021-05-12 MED ORDER — EZETIMIBE 10 MG PO TABS
10.0000 mg | ORAL_TABLET | Freq: Every day | ORAL | Status: DC
  Administered 2021-05-13 – 2021-05-15 (×3): 10 mg via ORAL
  Filled 2021-05-12 (×4): qty 1

## 2021-05-12 MED ORDER — METOPROLOL SUCCINATE ER 50 MG PO TB24
50.0000 mg | ORAL_TABLET | Freq: Every day | ORAL | Status: DC
  Administered 2021-05-12 – 2021-05-15 (×4): 50 mg via ORAL
  Filled 2021-05-12 (×5): qty 1

## 2021-05-12 MED ORDER — LISINOPRIL 10 MG PO TABS
10.0000 mg | ORAL_TABLET | Freq: Every day | ORAL | Status: DC
Start: 2021-05-12 — End: 2021-05-16
  Administered 2021-05-12 – 2021-05-15 (×4): 10 mg via ORAL
  Filled 2021-05-12 (×4): qty 1

## 2021-05-12 MED ORDER — ALPRAZOLAM 0.25 MG PO TABS
0.2500 mg | ORAL_TABLET | Freq: Three times a day (TID) | ORAL | Status: DC | PRN
  Administered 2021-05-13 – 2021-05-15 (×3): 0.25 mg via ORAL
  Filled 2021-05-12 (×3): qty 1

## 2021-05-12 MED ORDER — ATORVASTATIN CALCIUM 40 MG PO TABS
80.0000 mg | ORAL_TABLET | Freq: Every day | ORAL | Status: DC
  Administered 2021-05-12 – 2021-05-15 (×4): 80 mg via ORAL
  Filled 2021-05-12 (×4): qty 2

## 2021-05-12 NOTE — H&P (Addendum)
History and Physical    Patient: Brittany Santiago FAO:[id number] DOB: 1930/04/18 DOA: 05/12/2021 DOS: the patient was seen and examined on 05/12/2021 PCP: Paulina Fusi, MD  Patient coming from: Home  Chief Complaint: No chief complaint on file.  HPI: Brittany Santiago is a 88 y.o. female with medical history significant of CAD s/p CABG, HTN, HLD, hypothyroidism. Presenting with right hip pain after fall. She was walking outside with her cane when she fell. She lost her balance, fell straight down, and then rolled to her back. She didn't hit her head. There was no LOC. She remembers the entire fall. She had no complaints of any issues for the entire day prior to the fall. There was no chest pain, palpitations, or lightheadedness/dizziness. Family attended to her quickly and got her to her feet. However, she was unable to support her weight under her own power. They became concerned and brought her to the ED. She reports that she had a recent hip surgery w/ Dr. Leroy Libman on 03/20/21. She otherwise denies any other aggravating or alleviating factors.    Review of Systems: As mentioned in the history of present illness. All other systems reviewed and are negative. 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   Past Surgical History:  Procedure Laterality Date   ABDOMINAL HYSTERECTOMY  2019   APPENDECTOMY      yaken with first c section    CESAREAN SECTION     x2    CONVERSION TO TOTAL HIP Right 03/20/2021   Procedure: Conversion of previous hemiarthroplasty  to right total hip arthroplasty;  Surgeon: Samson Frederic, MD;  Location: [facility];  Service: Orthopedics;  Laterality: Right;   CORONARY ARTERY BYPASS GRAFT  02/2015   DOUBLE BYPASS ; DONE AT  [facility]     EYE SURGERY  1999   bilateral cataract extraction    JOINT REPLACEMENT Right    hip   KNEE ARTHROSCOPY Left    neck back surgery     ROBOTIC ASSISTED TOTAL HYSTERECTOMY WITH BILATERAL SALPINGO OOPHERECTOMY Bilateral 06/15/2017   Procedure: XI ROBOTIC ASSISTED TOTAL HYSTERECTOMY WITH BILATERAL SALPINGO OOPHORECTOMY WITH SENTINAL LYMPH NODES, PELVIC LYMPHADENECTOMY, AND LYSIS OF ADHESIONS;  Surgeon: Adolphus Birchwood, MD;  Location: [facility];  Service: Gynecology;  Laterality: Bilateral;   ROTATOR CUFF REPAIR Left    THYROID SURGERY     THYROIDECTOMY  2000s   VAGINAL PROLAPSE REPAIR     Social History:  reports that she has never smoked. She has never used smokeless tobacco. She reports that she does not drink alcohol and does not use drugs.  No Known Allergies  Family History  Problem Relation Age of Onset   Heart failure Mother    Depression Father        commited suicide   Kidney cancer Brother    Leukemia Brother     Prior to  Admission medications   Medication Sig Start Date End Date Taking? Authorizing Provider  ALPRAZolam (XANAX) 0.25 MG tablet Take 0.25 mg by mouth 3 (three) times daily as needed for anxiety. 02/09/20   [provider]  atorvastatin (LIPITOR) 80 MG tablet Take 40 mg by mouth at bedtime.    [provider]  azelastine (OPTIVAR) 0.05 % ophthalmic solution Place 1 drop into both eyes daily as needed for dry eyes. 12/10/20   [provider]  Calcium Carb-Cholecalciferol (CALCIUM 500 + D PO) Take 1 tablet by mouth daily.    [provider]  cholecalciferol (VITAMIN D3) 25  MCG (1000 UNIT) tablet Take 1,000 Units by mouth daily.    [provider]  ezetimibe (ZETIA) 10 MG tablet Take 10 mg by mouth daily.     [provider]  Fiber POWD Take 1 Scoop by mouth daily.    [provider]  furosemide (LASIX) 40 MG tablet Take 40 mg by mouth daily.    [provider]  gabapentin (NEURONTIN) 300 MG capsule Take 1 capsule (300 mg total) by mouth 3 (three) times daily as needed (pain). 04/11/21 05/11/21  McDonald, Rachelle Hora, DPM  HYDROcodone-acetaminophen (NORCO/VICODIN) 5-325 MG tablet Take 1 tablet by mouth every 4 (four) hours as needed for moderate pain (pain score 4-6). 03/22/21   Swinteck, Arlys John, MD  levothyroxine (SYNTHROID) 75 MCG tablet Take 75 mcg by mouth daily.    [provider]  lisinopril (PRINIVIL,ZESTRIL) 10 MG tablet Take 1 tablet by mouth at bedtime. 12/03/17   [provider]  methocarbamol (ROBAXIN) 500 MG tablet Take 1 tablet (500 mg total) by mouth every 6 (six) hours as needed for muscle spasms. 03/22/21   Swinteck, Arlys John, MD  metoprolol succinate (TOPROL-XL) 50 MG 24 hr tablet Take 50 mg by mouth daily. 06/10/19   [provider]  Multiple Vitamin (MULTIVITAMIN WITH MINERALS) TABS tablet Take 1 tablet by mouth daily.    [provider]  nitroGLYCERIN (NITROSTAT) 0.4 MG SL tablet Place 0.4 mg under the tongue every 5 (five) minutes as needed for chest pain.    [provider]  omeprazole (PRILOSEC) 20 MG capsule Take 20 mg by mouth daily.    [provider]  ondansetron (ZOFRAN) 4 MG tablet Take 1 tablet (4 mg total) by mouth every 6 (six) hours as needed for nausea. 03/22/21   Swinteck, Arlys John, MD  oxyCODONE-acetaminophen (PERCOCET) 7.5-325 MG tablet oxycodone-acetaminophen 7.5 mg-325 mg tablet    [provider]  predniSONE (DELTASONE) 5 MG tablet Take by mouth 2 (two) times daily. 04/04/21   [provider]    Physical Exam: Vitals:   05/12/21 1300  BP:  (!) 150/59  Pulse: 86  Resp: 16  Temp: 99.9 F (37.7 C)  TempSrc: Axillary  SpO2: 96%   General: 88 y.o. female resting in bed in NAD Eyes: PERRL, normal sclera ENMT: Nares patent w/o discharge, orophaynx clear, dentition normal, ears w/o discharge/lesions/ulcers Neck: Supple, trachea midline Cardiovascular: RRR, +S1, S2, no m/g/r, equal pulses throughout Respiratory: CTABL, no w/r/r, normal WOB GI: BS+, NDNT, no masses noted, no organomegaly noted MSK: No e/c/c; limited ROM right hip d/t pain Neuro: A&O x 3, no focal deficits Psyc: Appropriate interaction and affect, calm/cooperative  Data Reviewed:  Results are pending, will review when available.  Assessment and Plan: No notes have been filed under this hospital service. Service: Hospitalist Right hip fracture     - placed in obs, tele     -  imaging from [facility] noted right hip fracture; had recent hip replacement     - EDP at [facility] spoke with on-call ortho from overnight, ortho thought it possible for her to be managed conservatively; case was reviewed by [facility] orthopedics insisted formal review from her regular orthopedics team; thus patient was transferred to [facility]     - I have placed a consult to [facility]; Dr Charlann Boxer to review imaging; appreciate assistance  UTI     - follow Ucx     - continue rocephin  CAD HLD     - resume home regimen when confirmed     HTN      - she was hypotensive at presentation to [facility]; hold regimen today; reassess BP in AM for resuming her medication  GERD     - PPI  Hypothyroidism     - continue home regimen when confirmed  CKD 3b     - she is at baseline; watch nephrotoxins; follow  Advance Care Planning:   Code Status: FULL  Consults: Orthopedics  Family Communication: w/ family at bedside  Severity of Illness: The appropriate patient status for this patient is OBSERVATION. Observation status is judged to be reasonable and necessary in order to provide the required intensity  of service to ensure the patient's safety. The patient's presenting symptoms, physical exam findings, and initial radiographic and laboratory data in the context of their medical condition is felt to place them at decreased risk for further clinical deterioration. Furthermore, it is anticipated that the patient will be medically stable for discharge from the hospital within 2 midnights of admission.   Author: Teddy Spike, DO 05/12/2021 1:45 PM  For on call review [email or web address].

## 2021-05-12 NOTE — Plan of Care (Signed)

## 2021-05-13 ENCOUNTER — Observation Stay (HOSPITAL_COMMUNITY): Payer: Medicare Other

## 2021-05-13 DIAGNOSIS — R41841 Cognitive communication deficit: Secondary | ICD-10-CM | POA: Diagnosis not present

## 2021-05-13 DIAGNOSIS — E782 Mixed hyperlipidemia: Secondary | ICD-10-CM | POA: Diagnosis not present

## 2021-05-13 DIAGNOSIS — M79671 Pain in right foot: Secondary | ICD-10-CM

## 2021-05-13 DIAGNOSIS — M9701XA Periprosthetic fracture around internal prosthetic right hip joint, initial encounter: Secondary | ICD-10-CM | POA: Diagnosis not present

## 2021-05-13 DIAGNOSIS — M7989 Other specified soft tissue disorders: Secondary | ICD-10-CM | POA: Diagnosis present

## 2021-05-13 DIAGNOSIS — C541 Malignant neoplasm of endometrium: Secondary | ICD-10-CM | POA: Diagnosis not present

## 2021-05-13 DIAGNOSIS — M6281 Muscle weakness (generalized): Secondary | ICD-10-CM | POA: Diagnosis not present

## 2021-05-13 DIAGNOSIS — M247 Protrusio acetabuli: Secondary | ICD-10-CM | POA: Diagnosis not present

## 2021-05-13 DIAGNOSIS — Z951 Presence of aortocoronary bypass graft: Secondary | ICD-10-CM | POA: Diagnosis not present

## 2021-05-13 DIAGNOSIS — Z79899 Other long term (current) drug therapy: Secondary | ICD-10-CM | POA: Diagnosis not present

## 2021-05-13 DIAGNOSIS — I10 Essential (primary) hypertension: Secondary | ICD-10-CM | POA: Diagnosis not present

## 2021-05-13 DIAGNOSIS — Z806 Family history of leukemia: Secondary | ICD-10-CM | POA: Diagnosis not present

## 2021-05-13 DIAGNOSIS — Z9889 Other specified postprocedural states: Secondary | ICD-10-CM | POA: Diagnosis not present

## 2021-05-13 DIAGNOSIS — E039 Hypothyroidism, unspecified: Secondary | ICD-10-CM

## 2021-05-13 DIAGNOSIS — Z8542 Personal history of malignant neoplasm of other parts of uterus: Secondary | ICD-10-CM | POA: Diagnosis not present

## 2021-05-13 DIAGNOSIS — R200 Anesthesia of skin: Secondary | ICD-10-CM | POA: Diagnosis not present

## 2021-05-13 DIAGNOSIS — Z96641 Presence of right artificial hip joint: Secondary | ICD-10-CM | POA: Diagnosis present

## 2021-05-13 DIAGNOSIS — Z9842 Cataract extraction status, left eye: Secondary | ICD-10-CM | POA: Diagnosis not present

## 2021-05-13 DIAGNOSIS — Z7952 Long term (current) use of systemic steroids: Secondary | ICD-10-CM | POA: Diagnosis not present

## 2021-05-13 DIAGNOSIS — R279 Unspecified lack of coordination: Secondary | ICD-10-CM | POA: Diagnosis not present

## 2021-05-13 DIAGNOSIS — I351 Nonrheumatic aortic (valve) insufficiency: Secondary | ICD-10-CM | POA: Diagnosis not present

## 2021-05-13 DIAGNOSIS — Z8249 Family history of ischemic heart disease and other diseases of the circulatory system: Secondary | ICD-10-CM | POA: Diagnosis not present

## 2021-05-13 DIAGNOSIS — D638 Anemia in other chronic diseases classified elsewhere: Secondary | ICD-10-CM | POA: Diagnosis present

## 2021-05-13 DIAGNOSIS — K219 Gastro-esophageal reflux disease without esophagitis: Secondary | ICD-10-CM | POA: Diagnosis present

## 2021-05-13 DIAGNOSIS — R2681 Unsteadiness on feet: Secondary | ICD-10-CM | POA: Diagnosis not present

## 2021-05-13 DIAGNOSIS — Z9181 History of falling: Secondary | ICD-10-CM | POA: Diagnosis not present

## 2021-05-13 DIAGNOSIS — S72111A Displaced fracture of greater trochanter of right femur, initial encounter for closed fracture: Secondary | ICD-10-CM | POA: Diagnosis present

## 2021-05-13 DIAGNOSIS — W010XXA Fall on same level from slipping, tripping and stumbling without subsequent striking against object, initial encounter: Secondary | ICD-10-CM | POA: Diagnosis present

## 2021-05-13 DIAGNOSIS — S72001A Fracture of unspecified part of neck of right femur, initial encounter for closed fracture: Secondary | ICD-10-CM | POA: Diagnosis not present

## 2021-05-13 DIAGNOSIS — Y929 Unspecified place or not applicable: Secondary | ICD-10-CM | POA: Diagnosis not present

## 2021-05-13 DIAGNOSIS — M9701XD Periprosthetic fracture around internal prosthetic right hip joint, subsequent encounter: Secondary | ICD-10-CM | POA: Diagnosis not present

## 2021-05-13 DIAGNOSIS — M79672 Pain in left foot: Secondary | ICD-10-CM | POA: Diagnosis present

## 2021-05-13 DIAGNOSIS — Z9841 Cataract extraction status, right eye: Secondary | ICD-10-CM | POA: Diagnosis not present

## 2021-05-13 DIAGNOSIS — I2510 Atherosclerotic heart disease of native coronary artery without angina pectoris: Secondary | ICD-10-CM | POA: Diagnosis present

## 2021-05-13 DIAGNOSIS — Z79890 Hormone replacement therapy (postmenopausal): Secondary | ICD-10-CM | POA: Diagnosis not present

## 2021-05-13 DIAGNOSIS — I129 Hypertensive chronic kidney disease with stage 1 through stage 4 chronic kidney disease, or unspecified chronic kidney disease: Secondary | ICD-10-CM | POA: Diagnosis present

## 2021-05-13 DIAGNOSIS — R404 Transient alteration of awareness: Secondary | ICD-10-CM | POA: Diagnosis not present

## 2021-05-13 DIAGNOSIS — Z7401 Bed confinement status: Secondary | ICD-10-CM | POA: Diagnosis not present

## 2021-05-13 DIAGNOSIS — R531 Weakness: Secondary | ICD-10-CM | POA: Diagnosis not present

## 2021-05-13 DIAGNOSIS — E860 Dehydration: Secondary | ICD-10-CM | POA: Diagnosis present

## 2021-05-13 DIAGNOSIS — S72009A Fracture of unspecified part of neck of unspecified femur, initial encounter for closed fracture: Secondary | ICD-10-CM | POA: Diagnosis present

## 2021-05-13 DIAGNOSIS — E785 Hyperlipidemia, unspecified: Secondary | ICD-10-CM | POA: Diagnosis present

## 2021-05-13 DIAGNOSIS — N1832 Chronic kidney disease, stage 3b: Secondary | ICD-10-CM | POA: Diagnosis present

## 2021-05-13 DIAGNOSIS — S728X1D Other fracture of right femur, subsequent encounter for closed fracture with routine healing: Secondary | ICD-10-CM | POA: Diagnosis not present

## 2021-05-13 DIAGNOSIS — M25551 Pain in right hip: Secondary | ICD-10-CM | POA: Diagnosis not present

## 2021-05-13 DIAGNOSIS — S72141A Displaced intertrochanteric fracture of right femur, initial encounter for closed fracture: Secondary | ICD-10-CM | POA: Diagnosis not present

## 2021-05-13 DIAGNOSIS — Z8051 Family history of malignant neoplasm of kidney: Secondary | ICD-10-CM | POA: Diagnosis not present

## 2021-05-13 LAB — CBC WITH DIFFERENTIAL/PLATELET
Abs Immature Granulocytes: 0.03 10*3/uL (ref 0.00–0.07)
Basophils Absolute: 0 10*3/uL (ref 0.0–0.1)
Basophils Relative: 1 %
Eosinophils Absolute: 0.2 10*3/uL (ref 0.0–0.5)
Eosinophils Relative: 2 %
HCT: 25.8 % — ABNORMAL LOW (ref 36.0–46.0)
Hemoglobin: 8.3 g/dL — ABNORMAL LOW (ref 12.0–15.0)
Immature Granulocytes: 0 %
Lymphocytes Relative: 27 %
Lymphs Abs: 2 10*3/uL (ref 0.7–4.0)
MCH: 30 pg (ref 26.0–34.0)
MCHC: 32.2 g/dL (ref 30.0–36.0)
MCV: 93.1 fL (ref 80.0–100.0)
Monocytes Absolute: 1.4 10*3/uL — ABNORMAL HIGH (ref 0.1–1.0)
Monocytes Relative: 18 %
Neutro Abs: 3.9 10*3/uL (ref 1.7–7.7)
Neutrophils Relative %: 52 %
Platelets: 181 10*3/uL (ref 150–400)
RBC: 2.77 MIL/uL — ABNORMAL LOW (ref 3.87–5.11)
RDW: 15.5 % (ref 11.5–15.5)
WBC: 7.5 10*3/uL (ref 4.0–10.5)
nRBC: 0 % (ref 0.0–0.2)

## 2021-05-13 LAB — COMPREHENSIVE METABOLIC PANEL
ALT: 14 U/L (ref 0–44)
AST: 23 U/L (ref 15–41)
Albumin: 3 g/dL — ABNORMAL LOW (ref 3.5–5.0)
Alkaline Phosphatase: 46 U/L (ref 38–126)
Anion gap: 8 (ref 5–15)
BUN: 31 mg/dL — ABNORMAL HIGH (ref 8–23)
CO2: 22 mmol/L (ref 22–32)
Calcium: 8.8 mg/dL — ABNORMAL LOW (ref 8.9–10.3)
Chloride: 104 mmol/L (ref 98–111)
Creatinine, Ser: 1.24 mg/dL — ABNORMAL HIGH (ref 0.44–1.00)
GFR, Estimated: 42 mL/min — ABNORMAL LOW (ref >60)
Glucose, Bld: 113 mg/dL — ABNORMAL HIGH (ref 70–99)
Potassium: 4.3 mmol/L (ref 3.5–5.1)
Sodium: 134 mmol/L — ABNORMAL LOW (ref 135–145)
Total Bilirubin: 0.7 mg/dL (ref 0.3–1.2)
Total Protein: 5.9 g/dL — ABNORMAL LOW (ref 6.5–8.1)

## 2021-05-13 MED ORDER — ENSURE ENLIVE PO LIQD
237.0000 mL | Freq: Two times a day (BID) | ORAL | Status: DC
  Administered 2021-05-13 – 2021-05-15 (×4): 237 mL via ORAL

## 2021-05-13 MED ORDER — ADULT MULTIVITAMIN W/MINERALS CH
1.0000 | ORAL_TABLET | Freq: Every day | ORAL | Status: DC
  Administered 2021-05-13 – 2021-05-15 (×3): 1 via ORAL
  Filled 2021-05-13 (×4): qty 1

## 2021-05-13 NOTE — Consult Note (Signed)
Reason for Consult:right hip peri-prosthetic proximal femur fracture ?Referring Physician: Wynelle Cleveland, MD (Hospitalist) ? ?Brittany Santiago is an 88 y.o. female.  ?HPI: Brittany Santiago is a 88 y.o. female with medical history significant of CAD s/p CABG, HTN, HLD, hypothyroidism. Presenting with right hip pain after fall. She was walking outside with her cane when she fell. She lost her balance, fell straight down, and then rolled to her back. She didn't hit her head. There was no LOC. She remembers the entire fall. She had no complaints of any issues for the entire day prior to the fall. There was no chest pain, palpitations, or lightheadedness/dizziness. Family attended to her quickly and got her to her feet. However, she was unable to support her weight under her own power. They became concerned and brought her to the ED. She reports that she had a recent hip surgery w/ Dr. Lyla Glassing on 03/20/21. She otherwise denies any other aggravating or alleviating factors.   ? ?Past Medical History:  ?Diagnosis Date  ? Atherosclerosis of coronary artery 03/16/2015  ? Coronary artery disease involving native coronary artery of native heart without angina pectoris 03/16/2015  ? Endometrial cancer ([place]) 05/27/2017  ? Enterocele 12/30/2017  ? Female bladder prolapse   ? GERD (gastroesophageal reflux disease)   ? Hyperlipidemia   ? Hypertension   ? Hypothyroidism   ? Left bundle branch block (LBBB) 03/04/2017  ? Menopausal symptom 05/29/2017  ? Mild aortic regurgitation 11/15/2019  ? Mild aortic stenosis 11/15/2019  ? 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  ? Seasonal allergies   ? Thyroid disease   ? 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 ? ?Past Surgical History:  ?Procedure Laterality Date  ? ABDOMINAL HYSTERECTOMY  2019  ? APPENDECTOMY    ? yaken with first c section   ? CESAREAN SECTION    ? x2   ? CONVERSION TO TOTAL HIP Right 03/20/2021  ? Procedure: Conversion of previous hemiarthroplasty  to right total hip arthroplasty;  Surgeon: Rod Can, MD;  Location: [facility];  Service: Orthopedics;  Laterality: Right;  ? CORONARY ARTERY BYPASS GRAFT  02/2015  ? DOUBLE BYPASS ; DONE AT  [place]    ? EYE SURGERY  1999  ? bilateral cataract extraction   ? JOINT REPLACEMENT Right   ? hip  ? KNEE ARTHROSCOPY Left   ? neck back surgery    ? ROBOTIC ASSISTED TOTAL HYSTERECTOMY WITH BILATERAL SALPINGO OOPHERECTOMY Bilateral 06/15/2017  ? Procedure: XI ROBOTIC ASSISTED TOTAL HYSTERECTOMY WITH BILATERAL SALPINGO OOPHORECTOMY WITH SENTINAL LYMPH NODES, PELVIC LYMPHADENECTOMY, AND LYSIS OF ADHESIONS;  Surgeon: Everitt Amber, MD;  Location: [facility];  Service: Gynecology;  Laterality: Bilateral;  ? ROTATOR CUFF REPAIR Left   ? THYROID SURGERY    ? THYROIDECTOMY  2000s  ? VAGINAL PROLAPSE REPAIR    ? ? ?Family History  ?Problem Relation Age of Onset  ? Heart failure Mother   ? Depression Father   ?     commited suicide  ? Kidney cancer Brother   ? Leukemia Brother   ? ? ?Social History:  reports that she has never smoked. She has never used smokeless tobacco. She reports that she does not drink alcohol and does not use drugs. ? ?Allergies: No Known Allergies ? ?Medications: I have reviewed the patient's current medications. ?Scheduled: ? aspirin EC  81 mg Oral Daily  ? atorvastatin  80 mg Oral QHS  ? enoxaparin (LOVENOX) injection  30 mg Subcutaneous Q24H  ?  ezetimibe  10 mg Oral Daily  ? feeding supplement  237 mL Oral BID BM  ? levothyroxine  75 mcg Oral Q0600  ? lisinopril  10 mg Oral QHS  ? metoprolol succinate  50 mg Oral Daily  ? multivitamin with minerals  1 tablet Oral Daily  ? pantoprazole  40 mg Oral Daily  ? ? ?Results for orders placed or performed during the hospital encounter of 05/12/21 (from the past 24 hour(s))  ?Comprehensive metabolic panel     Status: Abnormal  ? Collection Time: 05/13/21  4:55 AM  ?Result Value Ref Range  ? Sodium 134 (L) 135 - 145 mmol/L  ? Potassium 4.3 3.5 - 5.1 mmol/L  ? Chloride 104 98 - 111 mmol/L  ? CO2 22 22 - 32  mmol/L  ? Glucose, Bld 113 (H) 70 - 99 mg/dL  ? BUN 31 (H) 8 - 23 mg/dL  ? Creatinine, Ser 1.24 (H) 0.44 - 1.00 mg/dL  ? Calcium 8.8 (L) 8.9 - 10.3 mg/dL  ? Total Protein 5.9 (L) 6.5 - 8.1 g/dL  ? Albumin 3.0 (L) 3.5 - 5.0 g/dL  ? AST 23 15 - 41 U/L  ? ALT 14 0 - 44 U/L  ? Alkaline Phosphatase 46 38 - 126 U/L  ? Total Bilirubin 0.7 0.3 - 1.2 mg/dL  ? GFR, Estimated 42 (L) >60 mL/min  ? Anion gap 8 5 - 15  ?CBC with Differential/Platelet     Status: Abnormal  ? Collection Time: 05/13/21  4:55 AM  ?Result Value Ref Range  ? WBC 7.5 4.0 - 10.5 K/uL  ? RBC 2.77 (L) 3.87 - 5.11 MIL/uL  ? Hemoglobin 8.3 (L) 12.0 - 15.0 g/dL  ? HCT 25.8 (L) 36.0 - 46.0 %  ? MCV 93.1 80.0 - 100.0 fL  ? MCH 30.0 26.0 - 34.0 pg  ? MCHC 32.2 30.0 - 36.0 g/dL  ? RDW 15.5 11.5 - 15.5 %  ? Platelets 181 150 - 400 K/uL  ? nRBC 0.0 0.0 - 0.2 %  ? Neutrophils Relative % 52 %  ? Neutro Abs 3.9 1.7 - 7.7 K/uL  ? Lymphocytes Relative 27 %  ? Lymphs Abs 2.0 0.7 - 4.0 K/uL  ? Monocytes Relative 18 %  ? Monocytes Absolute 1.4 (H) 0.1 - 1.0 K/uL  ? Eosinophils Relative 2 %  ? Eosinophils Absolute 0.2 0.0 - 0.5 K/uL  ? Basophils Relative 1 %  ? Basophils Absolute 0.0 0.0 - 0.1 K/uL  ? Immature Granulocytes 0 %  ? Abs Immature Granulocytes 0.03 0.00 - 0.07 K/uL  ? ? ?X-ray: ?CLINICAL DATA:  Right hip fracture ?  ?EXAM: ?PORTABLE PELVIS 1-2 VIEWS ?  ?COMPARISON:  Pelvic x-ray 03/20/2021 ?  ?FINDINGS: ?Acute comminuted periprosthetic fracture of the proximal right femur ?with mild-to-moderate displacement of fracture fragments. No ?additional fracture identified. Bones are osteopenic. Right hip ?arthroplasty, hardware appears intact. ?  ?IMPRESSION: ?Acute comminuted and displaced periprosthetic fracture of the ?proximal right femur. ?  ?  ?Electronically Signed ?  By: Ofilia Neas M.D. ? ?CLINICAL DATA:  Periprosthetic right femur fracture ?  ?EXAM: ?CT OF THE RIGHT HIP WITHOUT CONTRAST ?  ?TECHNIQUE: ?Multidetector CT imaging of the right hip was  performed according to ?the standard protocol. Multiplanar CT image reconstructions were ?also generated. ?  ?RADIATION DOSE REDUCTION: This exam was performed according to the ?departmental dose-optimization program which includes automated ?exposure control, adjustment of the mA and/or kV according to ?patient size and/or use of  iterative reconstruction technique. ?  ?COMPARISON:  X-ray 05/11/2021, 12/18/2020 ?  ?FINDINGS: ?Bones/Joint/Cartilage ?  ?Postsurgical changes from prior right total hip arthroplasty. ?Arthroplasty components appear intact without hardware fracture. ?Progressive superomedial protrusion of the acetabular cup with small ?periprosthetic fracture fragment along the medial wall of the ?acetabulum (series 6, image 41). Acute comminuted periprosthetic ?fracture of the proximal right femur involving the greater ?trochanter and intertrochanteric aspects of the femur. Greater ?trochanteric fragment is anterior laterally displaced. Bones appear ?demineralized. ?  ?Ligaments ?  ?Suboptimally assessed by CT. ?  ?Muscles and Tendons ?  ?Fullness within the anterior compartment musculature of the proximal ?right thigh suggesting intramuscular hematoma. ?  ?Soft tissues ?  ?Additional fluid collection along the posterolateral margin of the ?greater trochanter measuring approximately 7.5 x 2.9 x 7.0 cm, ?likely hematoma (series 5, image 44). Additional curvilinear fluid ?collection within the subcutaneous soft tissues overlying the ?anterolateral aspect of the proximal right thigh measuring ?approximately 10.8 x 1.3 x 6.2 cm (series 5, image 74). This ?collection appears somewhat thick-walled and may reflect sequela of ?prior postoperative seroma or hematoma. ?  ?IMPRESSION: ?1. Acute comminuted periprosthetic fracture of the proximal right ?femur involving the greater trochanter and intertrochanteric ?aspects. ?2. Progressive acetabular protrusio deformity with small ?periprosthetic fracture fragment  along the medial wall of the ?acetabulum. ?3. Fullness within the anterior compartment musculature of the ?proximal right thigh suggesting intramuscular hematoma. ?4. Additional fluid collection along the posterolater

## 2021-05-13 NOTE — Progress Notes (Signed)
Initial Nutrition Assessment ? ?DOCUMENTATION CODES:  ? ?Not applicable ? ?INTERVENTION:  ?- will order Ensure Plus High Protein BID, each supplement provides 350 kcal and 20 grams of protein. ?- will order 1 tablet Mmultivitamin with minerals daily. ? ? ?NUTRITION DIAGNOSIS:  ? ?Increased nutrient needs related to hip fracture as evidenced by estimated needs. ? ?GOAL:  ? ?Patient will meet greater than or equal to 90% of their needs ? ?MONITOR:  ? ?PO intake, Supplement acceptance, Labs, Weight trends ? ?REASON FOR ASSESSMENT:  ? ?Consult ?Hip fracture protocol ? ?ASSESSMENT:  ? ?88 y.o. female with medical history of CAD s/p CABG x2, HTN, HLD, hypothyroidism, endometrial cancer, and GERD. She presented to the ED after a fall with subsequent R hip pain. She did not hit her head and no LOC. Patient had hip surgery on 03/20/21.in the ED she was noted to have R hip fracture and UTI. ? ?Patient being followed by Ortho and note from this AM indicates ongoing discussions to determine R hip fracture management plan.  ? ?Patient is on a diet and ate 100% of dinner last night ([employer], 347 kcal and 21 grams protein). She has not ordered breakfast this AM. ? ?Weight today is 138 lb, weight on 03/20/21 (date of hip surgery) was 135 lb, and weight has overall been stable since 11/26/2018. ? ?Moderate pitting edema to RLE documented in the edema section of flow sheet which could account for weight gain between 1/25 and today.  ? ? ?Labs reviewed; Na: 134 mmol/l, BUN: 31 mg/dl, creatinine: 1.24 mg/dl, GFR: 42 mg/dl. ? ?Medications reviewed; 40 mg oral lasix/day, 75 mcg oral synthroid/day, 40 mg oral protonix/day. ?  ? ?NUTRITION - FOCUSED PHYSICAL EXAM: ? ?Unable to complete at this time. ? ?Diet Order:   ?Diet Order   ? ?       ?  Diet Heart Room service appropriate? Yes; Fluid consistency: Thin  Diet effective now       ?  ? ?  ?  ? ?  ? ? ?EDUCATION NEEDS:  ? ?No education needs have been identified at this time ? ?Skin:  Skin  Assessment: Reviewed RN Assessment ? ?Last BM:  PTA/unknown ? ?Height:  ? ?Ht Readings from Last 1 Encounters:  ?05/13/21 5' (1.524 m)  ? ? ?Weight:  ? ?Wt Readings from Last 1 Encounters:  ?05/13/21 62.6 kg  ? ? ? ?BMI:  Body mass index is 26.95 kg/m?. ? ?Estimated Nutritional Needs:  ?Kcal:  1550-1750 kcal ?Protein:  75-85 grams ?Fluid:  >/= 1.6 L/day ? ? ? ? ? ?Jarome Matin, MS, RD, LDN ?Registered Dietitian II ?Inpatient Clinical Nutrition ?RD pager # and on-call/weekend pager # available in [place]  ? ?

## 2021-05-13 NOTE — Progress Notes (Signed)
Spoke with the daughter, Brittany Santiago, today to discuss results of the CT scan and the plan for Brittany Santiago. According to Dr. Aurea Graff note and Dr. Sid Falcon evaluation, the best course of action is non-operative treatment of her greater trochanter periprosthetic fracture. She may begin partial weight bearing on right lower extremity with therapy tomorrow. She will most likely need a SNF placement due to living alone and family is not close by.  ?Will continue to monitor her progress and recommend repeat x-rays in the office as an outpatient in 2 weeks.  ?Pain management as needed ?PT/OT ordered  ?

## 2021-05-13 NOTE — Progress Notes (Addendum)
Patient ID: Brittany Santiago, female   DOB: 06-04-1930, 88 y.o.   MRN: [id number] ? ?Consult received on transfer from outside hospital. ? ?I will discuss case with Dr. Lyla Glassing and provide treatment recommendations. ?Full note to follow ? ?After speaking with [place] he has placed an order for a CT scan to better evaluate the stability of the component and nature of the fracture. ?More to come. ?

## 2021-05-13 NOTE — Progress Notes (Addendum)
?[facility] Progress Note ? ?Patient: Brittany Santiago     ?VOJ:[id number]  ?DOA: 05/12/2021   ?PCP: Nicoletta Dress, MD  ? ?  ?  ?Brief hospital course: ? 88 y/o female with CAD, HTN, hypothyroidism fell and was unable to stand after the fall.  ?Xray pelvis> Acute comminuted and displaced periprosthetic fracture of the proximal right femur. ? ?Admitted by [facility]. Awaiting ortho eval.  ?  ? ? ? ?Subjective:  ?The patient states she has had left foot pain since her hip replacement by Dr Lyla Glassing earlier this year. She has had on and of swelling as well. She went to a foot doctor and then had is re-evaluated by Dr Lyla Glassing who switched her Gabapentin from Lyrica.  ? ?Assessment and Plan: ?Principal Problem: ?  Closed right hip fracture ([place]) ?- awaiting ortho eval ?- CT from today reviewed she has periprosthetic fracture and acute hematomas in the hip and thigh ?- f/u Hgb tomorrow ? ?Active Problems: ?Right foot swelling and pain ?- some is chronic since the hip surgery but their history is difficult to understand - they state the swelling resolved but it is quite swollen and tender today ?- I would like to rule out acute injury related to the fall that broke her hip ?- have ordered xrays and will follow ?- hold lyrica ? ?  Hypertension ? Coronary atherosclerosis of native coronary artery ?- cont ASA, Lipitor, Zetia, Metoprolol ?- hold Lasix as she tells me she is not eating or drinking well since her hip surgery ? ?  Hypothyroidism ?- cont synthroid ? ? ?DVT prophylaxis:  enoxaparin (LOVENOX) injection 30 mg Start: 05/12/21 1800 ?  Code Status: Full Code  ?Level of Care: Level of care: Telemetry ?Disposition Plan:  ?Status is: Observation ?The patient will require care spanning > 2 midnights and should be moved to inpatient because: hip fracture  ? ?Objective: ?  ?Vitals:  ? 05/13/21 0017 05/13/21 0019 05/13/21 9937 05/13/21 1696  ?BP: (!) 129/48 (!) 129/48 (!) 138/49 (!) 146/51  ?Pulse: 78 78 89  91  ?Resp: '20 20 18 17  '$ ?Temp: 99.1 ?F (37.3 ?C) 99.1 ?F (37.3 ?C) 99.9 ?F (37.7 ?C) 99.6 ?F (37.6 ?C)  ?TempSrc: Oral Oral Oral Oral  ?SpO2: 98%  95% 95%  ?Weight:  62.6 kg    ?Height:  5' (1.524 m)    ? ?Filed Weights  ? 05/13/21 0019  ?Weight: 62.6 kg  ? ?Exam: ?General exam: Appears comfortable  ?HEENT: PERRLA, oral mucosa moist, no sclera icterus or thrush ?Respiratory system: Clear to auscultation. Respiratory effort normal. ?Cardiovascular system: S1 & S2 heard, regular rate and rhythm ?Gastrointestinal system: Abdomen soft, non-tender, nondistended. Normal bowel sounds   ?Central nervous system: Alert and oriented. No focal neurological deficits. ?Extremities: No cyanosis, clubbing - right hip is swollen as is the right foot and right toes- Currently she is unable to move her right leg due to pain ?Skin: No rashes or ulcers ?Psychiatry:  Mood & affect appropriate.   ? ?Imaging and lab data was personally reviewed ? ? ? CBC: ?Recent Labs  ?Lab 05/12/21 ?1439 05/13/21 ?0455  ?WBC 7.8 7.5  ?NEUTROABS 4.2 3.9  ?HGB 9.3* 8.3*  ?HCT 28.1* 25.8*  ?MCV 92.1 93.1  ?PLT 196 181  ? ?Basic Metabolic Panel: ?Recent Labs  ?Lab 05/12/21 ?1439 05/13/21 ?0455  ?NA 134* 134*  ?K 4.6 4.3  ?CL 102 104  ?CO2 25 22  ?GLUCOSE 100* 113*  ?BUN 37* 31*  ?  CREATININE 1.50* 1.24*  ?CALCIUM 8.7* 8.8*  ? ?GFR: ?Estimated Creatinine Clearance: 25.9 mL/min (A) (by C-G formula based on SCr of 1.24 mg/dL (H)). ? ?Scheduled Meds: ? aspirin EC  81 mg Oral Daily  ? atorvastatin  80 mg Oral QHS  ? enoxaparin (LOVENOX) injection  30 mg Subcutaneous Q24H  ? ezetimibe  10 mg Oral Daily  ? feeding supplement  237 mL Oral BID BM  ? furosemide  40 mg Oral Daily  ? levothyroxine  75 mcg Oral Q0600  ? lisinopril  10 mg Oral QHS  ? metoprolol succinate  50 mg Oral Daily  ? multivitamin with minerals  1 tablet Oral Daily  ? pantoprazole  40 mg Oral Daily  ? ?Continuous Infusions: ? cefTRIAXone (ROCEPHIN)  IV 1 g (05/12/21 1622)  ? ? ? LOS: 1 day   ? ?Author: ?Debbe Odea  ?05/13/2021 1:11 PM ?   ?

## 2021-05-14 DIAGNOSIS — I10 Essential (primary) hypertension: Secondary | ICD-10-CM | POA: Diagnosis not present

## 2021-05-14 DIAGNOSIS — E782 Mixed hyperlipidemia: Secondary | ICD-10-CM

## 2021-05-14 DIAGNOSIS — S72001A Fracture of unspecified part of neck of right femur, initial encounter for closed fracture: Secondary | ICD-10-CM | POA: Diagnosis not present

## 2021-05-14 DIAGNOSIS — M79671 Pain in right foot: Secondary | ICD-10-CM | POA: Diagnosis not present

## 2021-05-14 LAB — URINE CULTURE: Culture: NO GROWTH

## 2021-05-14 NOTE — Progress Notes (Addendum)
?[facility] Progress Note ? ?Patient: Brittany Santiago     ?WFU:[id number]  ?DOA: 05/12/2021   ?PCP: Nicoletta Dress, MD  ? ?  ?  ?Brief hospital course: ? 88 y/o female with CAD, HTN, hypothyroidism fell and was unable to stand after the fall.  ?Xray pelvis> Acute comminuted and displaced periprosthetic fracture of the proximal right femur. ? ?Admitted by [facility]. Ortho recommends non operative management and partial weigh bearing. She is awaiting a SNF ?  ?Subjective:  ?Appears very upset this AM. Overall has no new complaints.  ? ?Assessment and Plan: ?Principal Problem: ?  Closed right hip fracture ([place]) ?- CT from reviewed she has periprosthetic fracture and acute hematomas in the hip and thigh ?-Hgb is 8.3 today - follow ? ?Active Problems: ?Right foot swelling and pain ?- some is chronic since the hip surgery but their history is difficult to understand - they state the swelling resolved but foot was quite swollen and tender yesterday ?- I ordered xrays which are unrevealing ?- foot is not swollen on exam today- the tenderness I noted yesterday has resolved ? ?  Hypertension- dehydration ? Coronary atherosclerosis of native coronary artery ?- cont ASA, Lipitor, Zetia, Metoprolol ?- holding Lasix as she tells me she is not eating or drinking well since her hip surgery ? ?  Hypothyroidism ?- cont synthroid ? ?NOTE: started on Ceftriaxone for a UTI by admitting doctor- Urine culture is negative- dc Ceftriaxone ? ? ?DVT prophylaxis:  enoxaparin (LOVENOX) injection 30 mg Start: 05/12/21 1800 ?  Code Status: Full Code  ?Level of Care: Level of care: Telemetry ?Disposition Plan:  ?Status is: inpatient  ? ?Objective: ?  ?Vitals:  ? 05/13/21 0921 05/13/21 1504 05/13/21 1932 05/14/21 0353  ?BP: (!) 146/51 100/63 (!) 135/45 (!) 135/55  ?Pulse: 91 77 82 84  ?Resp: '17 17 18 18  '$ ?Temp: 99.6 ?F (37.6 ?C) 98.1 ?F (36.7 ?C) 98.9 ?F (37.2 ?C) 98.7 ?F (37.1 ?C)  ?TempSrc: Oral Oral Oral Oral  ?SpO2: 95% 98%  96% 96%  ?Weight:      ?Height:      ? ?Filed Weights  ? 05/13/21 0019  ?Weight: 62.6 kg  ? ?Exam: ?General exam: Appears comfortable  ?HEENT: PERRLA, oral mucosa moist, no sclera icterus or thrush ?Respiratory system: Clear to auscultation. Respiratory effort normal. ?Cardiovascular system: S1 & S2 heard, regular rate and rhythm ?Gastrointestinal system: Abdomen soft, non-tender, nondistended. Normal bowel sounds   ?Central nervous system: Alert and oriented. No focal neurological deficits. ?Extremities: No cyanosis, clubbing   ?Psychiatry:  Mood & affect appropriate.   ? ?Imaging and lab data was personally reviewed ? ? ? CBC: ?Recent Labs  ?Lab 05/12/21 ?1439 05/13/21 ?0455  ?WBC 7.8 7.5  ?NEUTROABS 4.2 3.9  ?HGB 9.3* 8.3*  ?HCT 28.1* 25.8*  ?MCV 92.1 93.1  ?PLT 196 181  ? ? ?Basic Metabolic Panel: ?Recent Labs  ?Lab 05/12/21 ?1439 05/13/21 ?0455  ?NA 134* 134*  ?K 4.6 4.3  ?CL 102 104  ?CO2 25 22  ?GLUCOSE 100* 113*  ?BUN 37* 31*  ?CREATININE 1.50* 1.24*  ?CALCIUM 8.7* 8.8*  ? ? ?GFR: ?Estimated Creatinine Clearance: 25.9 mL/min (A) (by C-G formula based on SCr of 1.24 mg/dL (H)). ? ?Scheduled Meds: ? aspirin EC  81 mg Oral Daily  ? atorvastatin  80 mg Oral QHS  ? enoxaparin (LOVENOX) injection  30 mg Subcutaneous Q24H  ? ezetimibe  10 mg Oral Daily  ? feeding supplement  237 mL Oral BID BM  ? levothyroxine  75 mcg Oral Q0600  ? lisinopril  10 mg Oral QHS  ? metoprolol succinate  50 mg Oral Daily  ? multivitamin with minerals  1 tablet Oral Daily  ? pantoprazole  40 mg Oral Daily  ? ?Continuous Infusions: ? cefTRIAXone (ROCEPHIN)  IV 1 g (05/13/21 1630)  ? ? ? LOS: 2 days  ? ?Author: ?Debbe Odea  ?05/14/2021 11:54 AM ?   ?

## 2021-05-14 NOTE — Progress Notes (Signed)
I discussed the findings with the patient's daughter, Helene Kelp.  She is about 2 months out from conversion of hemiarthroplasty to total hip arthroplasty.  The patient fell at home and was brought to the emergency department.  She was seen by Dr. Alvan Dame.  X-rays and CT scan of the right hip showed a [place] AG periprosthetic femur fracture.  The femoral component and cement mantle are intact.  She has a trochanteric avulsion fracture.  We discussed that the trochanter is a thin shell of bone that is prone to pulling out from hardware if fixation is attempted.  Therefore, nonoperative treatment is recommended.  I recommend partial weightbearing with a walker.  Avoid active abduction for 6 weeks.  She will probably hurt for around 3 months.  We will plan to follow her with serial x-rays.  She will need SNF placement.  I need to see her 2 weeks after discharge in the office for radiographs.  All questions were solicited and answered. ?

## 2021-05-14 NOTE — TOC Initial Note (Signed)
Transition of Care (TOC) - Initial/Assessment Note  ? ? ?Patient Details  ?Name: Brittany Santiago ?MRN: [id number] ?Date of Birth: 07/09/1930 ? ?Transition of Care (TOC) CM/SW Contact:    ?Trish Mage, LCSW ?Phone Number: ?05/14/2021, 3:00 PM ? ?Clinical Narrative:   Patient seen in follow up to PT recommendation of SNF.  Brittany Santiago lives alone in [place], prefers to return home, but as her daughter points out, has been unable to be independent since her hip surgery.  They would prefer a facility in [place] or [place]. Bed search process explained and initiated. TOC will continue to follow during the course of hospitalization. ?             ? ? ?Expected Discharge Plan: [place] ?Barriers to Discharge: SNF Pending bed offer ? ? ?Patient Goals and CMS Choice ?Patient states their goals for this hospitalization and ongoing recovery are:: go home ?CMS Medicare.gov Compare Post Acute Care list provided to:: Patient ?Choice offered to / list presented to : Patient, Adult Children ? ?Expected Discharge Plan and Services ?Expected Discharge Plan: [place] ?  ?Discharge Planning Services: CM Consult ?Post Acute Care Choice: [place] ?Living arrangements for the past 2 months: [place] ?                ?  ?  ?  ?  ?  ?  ?  ?  ?  ?  ? ?Prior Living Arrangements/Services ?Living arrangements for the past 2 months: [place] ?Lives with:: Self ?Patient language and need for interpreter reviewed:: Yes ?       ?Need for Family Participation in Patient Care: Yes (Comment) ?Care giver support system in place?: Yes (comment) ?Current home services: DME ?Criminal Activity/Legal Involvement Pertinent to Current Situation/Hospitalization: No - Comment as needed ? ?Activities of Daily Living ?Home Assistive Devices/Equipment: [place] Rile (specify type) ?ADL Screening (condition at time of admission) ?Patient's cognitive ability adequate to safely complete daily activities?:  Yes ?Is the patient deaf or have difficulty hearing?: No ?Does the patient have difficulty seeing, even when wearing glasses/contacts?: No ?Does the patient have difficulty concentrating, remembering, or making decisions?: No ?Patient able to express need for assistance with ADLs?: Yes ?Does the patient have difficulty dressing or bathing?: No ?Independently performs ADLs?: Yes (appropriate for developmental age) ?Does the patient have difficulty walking or climbing stairs?: Yes ?Weakness of Legs: None ?Weakness of Arms/Hands: None ? ?Permission Sought/Granted ?Permission sought to share information with : Family Supports ?Permission granted to share information with : No ? Share Information with NAME: Piedad Climes (Daughter)   [phone] ?   ?   ?   ? ?Emotional Assessment ?Appearance:: Appears stated age ?Attitude/Demeanor/Rapport: Engaged ?Affect (typically observed): Appropriate ?Orientation: : Oriented to Self, Oriented to Place, Oriented to Situation ?Alcohol / Substance Use: Not Applicable ?Psych Involvement: No (comment) ? ?Admission diagnosis:  Hip fx ([place]) [S72.009A] ?UTI (urinary tract infection) [N39.0] ?Right hip pain [M25.551] ?Closed right hip fracture ([place]) [S72.001A] ?Patient Active Problem List  ? Diagnosis Date Noted  ? Foot pain, right   ? Right hip pain 05/12/2021  ? GERD (gastroesophageal reflux disease) 05/12/2021  ? Closed right hip fracture ([place]) 05/12/2021  ? UTI (urinary tract infection) 05/12/2021  ? Failed orthopedic implant, initial encounter ([place]) 03/20/2021  ? Hypertension 11/17/2019  ? Seasonal allergies 11/17/2019  ? Hyperlipidemia 11/17/2019  ? Hypothyroidism 11/17/2019  ? Mild aortic stenosis 11/15/2019  ? Mild aortic regurgitation 11/15/2019  ?  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 Enterocele 12/30/2017  ? Menopausal symptom 05/29/2017  ? Endometrial cancer ([place]) 05/27/2017  ? Dyslipidemia 03/10/2017  ? Left bundle branch block (LBBB) 03/04/2017  ? 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  ?  Coronary atherosclerosis of native coronary artery 03/16/2015  ? ?PCP:  Nicoletta Dress, MD ?Pharmacy:   ?[facility] ?[place] ?[place] [address] ?Phone: [phone] Fax: [phone] ? ?[facility] ?[place] ?[place] [address] ?Phone: [phone] Fax: [phone] ? ? ? ? ?Social Determinants of Health (SDOH) Interventions ?  ? ?Readmission Risk Interventions ?No flowsheet data found. ? ? ?

## 2021-05-14 NOTE — Progress Notes (Signed)
Patient ID: Brittany Santiago, female   DOB: January 08, 1931, 88 y.o.   MRN: [id number] ? ?Moderate pain.  Not much activity due to pain. ?No other events ? ?Right displaced proximal peri-prosthetic femur fracture status post right THR for femoral neck fracture, with questionable involvement of the acetabulum vs post op changes ? ?I have reviewed the case with [place] and have asked that he see her based on their recent relationship. ?For now the plan is non operative management with pain control, partial weight bearing with a walker ?

## 2021-05-14 NOTE — NC FL2 (Signed)
?[place] MEDICAID FL2 LEVEL OF CARE SCREENING TOOL  ?  ? ?IDENTIFICATION  ?Patient Name: ?Brittany Santiago Birthdate: 11-23-1930 Sex: female Admission Date (Current Location): ?05/12/2021  ?[place] and [place] Number: ? [place] ?  Facility and Address:  ?[facility],  [place], [place] ?     Provider Number: ?[id number]  ?Attending Physician Name and Address:  ?Debbe Odea, MD ? Relative Name and Phone Number:  ?Piedad Climes (Daughter)   [phone] ?   ?Current Level of Care: ?Hospital Recommended Level of Care: ?[place] Prior Approval Number: ?  ? ?Date Approved/Denied: ?  PASRR Number: ?[id number] A ? ?Discharge Plan: ?SNF ?  ? ?Current Diagnoses: ?Patient Active Problem List  ? Diagnosis Date Noted  ? Foot pain, right   ? Right hip pain 05/12/2021  ? GERD (gastroesophageal reflux disease) 05/12/2021  ? Closed right hip fracture ([place]) 05/12/2021  ? UTI (urinary tract infection) 05/12/2021  ? Failed orthopedic implant, initial encounter ([place]) 03/20/2021  ? Hypertension 11/17/2019  ? Seasonal allergies 11/17/2019  ? Hyperlipidemia 11/17/2019  ? Hypothyroidism 11/17/2019  ? Mild aortic stenosis 11/15/2019  ? Mild aortic regurgitation 11/15/2019  ? 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 Enterocele 12/30/2017  ? Menopausal symptom 05/29/2017  ? Endometrial cancer ([place]) 05/27/2017  ? Dyslipidemia 03/10/2017  ? Left bundle branch block (LBBB) 03/04/2017  ? 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  ? Coronary atherosclerosis of native coronary artery 03/16/2015  ? ? ?Orientation RESPIRATION BLADDER Height & Weight   ?  ?Self, Situation, Place ? Normal External catheter Weight: 62.6 kg ?Height:  5' (152.4 cm)  ?BEHAVIORAL SYMPTOMS/MOOD NEUROLOGICAL BOWEL NUTRITION STATUS  ?    Continent Diet (see d/c summary)  ?AMBULATORY STATUS COMMUNICATION OF NEEDS Skin   ?Extensive Assist Verbally Normal ?  ?  ?  ?    ?     ?     ? ? ?Personal Care Assistance Level of Assistance  ?Bathing, Feeding,  Dressing Bathing Assistance: Maximum assistance ?Feeding assistance: Independent ?Dressing Assistance: Maximum assistance ?   ? ?Functional Limitations Info  ?Sight, Hearing, Speech Sight Info: Adequate ?Hearing Info: Adequate ?Speech Info: Adequate  ? ? ?SPECIAL CARE FACTORS FREQUENCY  ?PT (By licensed PT), OT (By licensed OT)   ?  ?PT Frequency: 5X/W ?OT Frequency: 5X/W ?  ?  ?  ?   ? ? ?Contractures Contractures Info: Not present  ? ? ?Additional Factors Info  ?Code Status, Allergies Code Status Info: full ?Allergies Info: NKA ?  ?  ?  ?   ? ?Current Medications (05/14/2021):  This is the current hospital active medication list ?Current Facility-Administered Medications  ?Medication Dose Route Frequency Provider Last Rate Last Admin  ? ALPRAZolam Duanne Moron) tablet 0.25 mg  0.25 mg Oral TID PRN Cherylann Ratel A, DO   0.25 mg at 05/13/21 2214  ? aspirin EC tablet 81 mg  81 mg Oral Daily Kyle, Tyrone A, DO   81 mg at 05/14/21 0956  ? atorvastatin (LIPITOR) tablet 80 mg  80 mg Oral QHS Kyle, Tyrone A, DO   80 mg at 05/13/21 2158  ? enoxaparin (LOVENOX) injection 30 mg  30 mg Subcutaneous Q24H Kyle, Tyrone A, DO   30 mg at 05/13/21 1805  ? ezetimibe (ZETIA) tablet 10 mg  10 mg Oral Daily Kyle, Tyrone A, DO   10 mg at 05/14/21 8101  ? feeding supplement (ENSURE ENLIVE / ENSURE PLUS) liquid 237 mL  237 mL Oral BID BM Rizwan,  Saima, MD   237 mL at 05/14/21 1120  ? HYDROcodone-acetaminophen (NORCO/VICODIN) 5-325 MG per tablet 1-2 tablet  1-2 tablet Oral Q6H PRN Cherylann Ratel A, DO   2 tablet at 05/14/21 1345  ? ketotifen (ZADITOR) 0.025 % ophthalmic solution 1 drop  1 drop Both Eyes Daily PRN Marylyn Ishihara, Tyrone A, DO      ? levothyroxine (SYNTHROID) tablet 75 mcg  75 mcg Oral Q0600 Marylyn Ishihara, Tyrone A, DO   75 mcg at 05/14/21 0534  ? lisinopril (ZESTRIL) tablet 10 mg  10 mg Oral QHS Kyle, Tyrone A, DO   10 mg at 05/13/21 2158  ? metoprolol succinate (TOPROL-XL) 24 hr tablet 50 mg  50 mg Oral Daily Kyle, Tyrone A, DO   50 mg at 05/14/21  0277  ? multivitamin with minerals tablet 1 tablet  1 tablet Oral Daily Debbe Odea, MD   1 tablet at 05/14/21 0956  ? pantoprazole (PROTONIX) EC tablet 40 mg  40 mg Oral Daily Kyle, Tyrone A, DO   40 mg at 05/14/21 4128  ? ? ? ?Discharge Medications: ?Please see discharge summary for a list of discharge medications. ? ?Relevant Imaging Results: ? ?Relevant Lab Results: ? ? ?Additional Information ?[phone] ? ?Trish Mage, LCSW ? ? ? ? ?

## 2021-05-14 NOTE — Evaluation (Signed)
Physical Therapy Evaluation ?Patient Details ?Name: Brittany Brittany ?MRN: [id number] ?DOB: August 14, 1930 ?Today's Date: 05/14/2021 ? ?History of Present Illness ? Right displaced proximal peri-prosthetic femur fracture status post right THR which was perfromed on 1/25.for femoral neck fracture. PMH: HTN, HLD, endometrial cancer, CAD with history of CABG, asthma, LBBB, R THA  ?Clinical Impression ? Pt admitted with above diagnosis.  Pt currently with functional limitations due to the deficits listed below (see PT Problem List). Pt will benefit from skilled PT to increase their independence and safety with mobility to allow discharge to the venue listed below.  Pt limited by pain, but was agreeable to PROM/AAROM/AROM to B LE within her tolerance.  She was educated on Dr. Sid Falcon note that indicated to avoid active abduction for 6 weeks and she was to be PWB.  She was re-positioned in the bed, but unable to tolerate more movement.  She seemed frustrated by her current condition and is open to SNF rehab. ?   ?   ? ?Recommendations for follow up therapy are one component of a multi-disciplinary discharge planning process, led by the attending physician.  Recommendations may be updated based on patient status, additional functional criteria and insurance authorization. ? ?Follow Up Recommendations Skilled nursing-short term rehab (<3 hours/day) ? ?  ?Assistance Recommended at Discharge    ?Patient can return home with the following ? Two people to help with walking and/or transfers;Two people to help with bathing/dressing/bathroom ? ?  ?Equipment Recommendations None recommended by PT  ?Recommendations for Other Services ?    ?  ?Functional Status Assessment Patient has had a recent decline in their functional status and demonstrates the ability to make significant improvements in function in a reasonable and predictable amount of time.  ? ?  ?Precautions / Restrictions Precautions ?Precautions: Other (comment) ?Precaution  Comments: MD has written no active ABD ?Restrictions ?Weight Bearing Restrictions: Yes ?RLE Weight Bearing: Partial weight bearing  ? ?  ? ?Mobility ? Bed Mobility ?Overal bed mobility: Needs Assistance ?  ?  ?  ?  ?  ?  ?General bed mobility comments: TOT A due to pain ?  ? ?Transfers ?  ?  ?  ?  ?  ?  ?  ?  ?  ?  ?  ? ?Ambulation/Gait ?  ?  ?  ?  ?  ?  ?  ?  ? ?Stairs ?  ?  ?  ?  ?  ? ?Wheelchair Mobility ?  ? ?Modified Rankin (Stroke Patients Only) ?  ? ?  ? ?Balance   ?  ?  ?  ?  ?  ?  ?  ?  ?  ?  ?  ?  ?  ?  ?  ?  ?  ?  ?   ? ? ? ?Pertinent Vitals/Pain Pain Assessment ?Pain Assessment: Faces ?Faces Pain Scale: Hurts worst ?Pain Location: R LE with movement ?Pain Descriptors / Indicators: Guarding, Grimacing ?Pain Intervention(s): Limited activity within patient's tolerance, Monitored during session, Repositioned  ? ? ?Home Living Family/patient expects to be discharged to:: Skilled nursing facility ?Living Arrangements: Alone ?  ?  ?  ?  ?  ?  ?  ?  ?   ?  ?Prior Function   ?  ?  ?  ?  ?  ?  ?Mobility Comments: Indepenent prior to surgery in January and was progressing well with recovery ?  ?  ? ? ?Hand Dominance  ?   ? ?  ?  Extremity/Trunk Assessment  ?   ?  ? ?Lower Extremity Assessment ?Lower Extremity Assessment: RLE deficits/detail ?RLE Deficits / Details: position of comfort was hip abducted and knee flexed- repositioned for a more neutral hip position ?RLE: Unable to fully assess due to pain ?  ? ?   ?Communication  ?    ?Cognition Arousal/Alertness: Awake/alert ?Behavior During Therapy: [facility] for tasks assessed/performed ?Overall Cognitive Status: Within Functional Limits for tasks assessed ?  ?  ?  ?  ?  ?  ?  ?  ?  ?  ?  ?  ?  ?  ?  ?  ?  ?  ?  ? ?  ?General Comments   ? ?  ?Exercises General Exercises - Lower Extremity ?Ankle Circles/Pumps: AROM, AAROM, Right, Both, 10 reps ?Quad Sets: Strengthening, Both, 5 reps ?Gluteal Sets: Strengthening, 5 reps ?Short Arc Quad: AAROM, Right, 5 reps   ? ?Assessment/Plan  ?  ?PT Assessment Patient needs continued PT services  ?PT Problem List Decreased range of motion;Decreased activity tolerance;Decreased balance;Pain;Decreased mobility;Decreased safety awareness ? ?   ?  ?PT Treatment Interventions DME instruction;Functional mobility training;Neuromuscular re-education;Balance training;Therapeutic exercise;Patient/family education;Therapeutic activities   ? ?PT Goals (Current goals can be found in the Care Plan section)  ?Acute Rehab PT Goals ?Patient Stated Goal: Go to rehab ?PT Goal Formulation: With patient ?Time For Goal Achievement: 05/28/21 ?Potential to Achieve Goals: Good ? ?  ?Frequency Min 2X/week ?  ? ? ?Co-evaluation   ?  ?  ?  ?  ? ? ?  ?AM-PAC PT "6 Clicks" Mobility  ?Outcome Measure Help needed turning from your back to your side while in a flat bed without using bedrails?: Total ?Help needed moving from lying on your back to sitting on the side of a flat bed without using bedrails?: Total ?Help needed moving to and from a bed to a chair (including a wheelchair)?: Total ?Help needed standing up from a chair using your arms (e.g., wheelchair or bedside chair)?: Total ?Help needed to walk in hospital room?: Total ?Help needed climbing 3-5 steps with a railing? : Total ?6 Click Score: 6 ? ?  ?End of Session   ?Activity Tolerance: Patient limited by pain ?Patient left: in bed;with call bell/phone within reach;with bed alarm set ?Nurse Communication: Mobility status ?PT Visit Diagnosis: History of falling (Z91.81);Muscle weakness (generalized) (M62.81) ?  ? ?Time: 8101-7510 ?PT Time Calculation (min) (ACUTE ONLY): 33 min ? ? ?Charges:   PT Evaluation ?$PT Eval Low Complexity: 1 Low ?PT Treatments ?$Therapeutic Exercise: 8-22 mins ?  ?   ? ? ?Brittany Brittany, PT ? ?05/14/2021 ? ? ?Brittany Brittany ?05/14/2021, 12:01 PM ? ?

## 2021-05-14 NOTE — Plan of Care (Signed)
No acute events overnight. ?Problem: Education: ?Goal: Knowledge of General Education information will improve ?Description: Including pain rating scale, medication(s)/side effects and non-pharmacologic comfort measures ?Outcome: Progressing ?  ?Problem: Health Behavior/Discharge Planning: ?Goal: Ability to manage health-related needs will improve ?Outcome: Progressing ?  ?Problem: Clinical Measurements: ?Goal: Ability to maintain clinical measurements within normal limits will improve ?Outcome: Progressing ?Goal: Will remain free from infection ?Outcome: Progressing ?Goal: Diagnostic test results will improve ?Outcome: Progressing ?Goal: Respiratory complications will improve ?Outcome: Progressing ?Goal: Cardiovascular complication will be avoided ?Outcome: Progressing ?  ?Problem: Activity: ?Goal: Risk for activity intolerance will decrease ?Outcome: Progressing ?  ?Problem: Coping: ?Goal: Level of anxiety will decrease ?Outcome: Progressing ?  ?Problem: Elimination: ?Goal: Will not experience complications related to bowel motility ?Outcome: Progressing ?Goal: Will not experience complications related to urinary retention ?Outcome: Progressing ?  ?Problem: Pain Managment: ?Goal: General experience of comfort will improve ?Outcome: Progressing ?  ?Problem: Safety: ?Goal: Ability to remain free from injury will improve ?Outcome: Progressing ?  ?Problem: Skin Integrity: ?Goal: Risk for impaired skin integrity will decrease ?Outcome: Progressing ?  ?

## 2021-05-15 DIAGNOSIS — D638 Anemia in other chronic diseases classified elsewhere: Secondary | ICD-10-CM

## 2021-05-15 DIAGNOSIS — S72001A Fracture of unspecified part of neck of right femur, initial encounter for closed fracture: Secondary | ICD-10-CM | POA: Diagnosis not present

## 2021-05-15 HISTORY — DX: Anemia in other chronic diseases classified elsewhere: D63.8

## 2021-05-15 LAB — HEMOGLOBIN AND HEMATOCRIT, BLOOD
HCT: 22.6 % — ABNORMAL LOW (ref 36.0–46.0)
HCT: 26.8 % — ABNORMAL LOW (ref 36.0–46.0)
Hemoglobin: 7.6 g/dL — ABNORMAL LOW (ref 12.0–15.0)
Hemoglobin: 9 g/dL — ABNORMAL LOW (ref 12.0–15.0)

## 2021-05-15 LAB — PREPARE RBC (CROSSMATCH)

## 2021-05-15 MED ORDER — PREGABALIN 75 MG PO CAPS: 75.0000 mg | ORAL_CAPSULE | Freq: Two times a day (BID) | ORAL | 0 refills | Status: DC

## 2021-05-15 MED ORDER — SODIUM CHLORIDE 0.9% IV SOLUTION: Freq: Once | INTRAVENOUS | Status: DC

## 2021-05-15 MED ORDER — FUROSEMIDE 40 MG PO TABS: 40.0000 mg | ORAL_TABLET | Freq: Every day | ORAL | Status: DC

## 2021-05-15 MED ORDER — ALPRAZOLAM 0.25 MG PO TABS: 0.2500 mg | ORAL_TABLET | Freq: Three times a day (TID) | ORAL | 0 refills | Status: AC | PRN

## 2021-05-15 MED ORDER — FUROSEMIDE 40 MG PO TABS
40.0000 mg | ORAL_TABLET | Freq: Once | ORAL | Status: AC
  Administered 2021-05-15: 40 mg via ORAL
  Filled 2021-05-15: qty 1

## 2021-05-15 MED ORDER — HYDROCODONE-ACETAMINOPHEN 5-325 MG PO TABS: 1.0000 | ORAL_TABLET | Freq: Three times a day (TID) | ORAL | 0 refills | Status: DC | PRN

## 2021-05-15 NOTE — Discharge Summary (Addendum)
. ?Physician Discharge Summary  ?Brittany Santiago LSL:[id number] DOB: 1931/01/28 DOA: 05/12/2021 ? ?PCP: Nicoletta Dress, MD ? ?Admit date: 05/12/2021 ?Discharge date: 05/16/2021 ?Recommendations for Outpatient Follow-up:  ?Follow up with PCP in 1 weeks-call for appointment ?Please obtain BMP/CBC in one week ? ?Discharge Dispo: SNF ?Discharge Condition: Stable ?Code Status:   Code Status: Full Code ?Diet recommendation:  ?Diet Order   ? ?       ?  Diet Heart Room service appropriate? Yes; Fluid consistency: Thin  Diet effective now       ?  ? ?  ?  ? ?  ?Brief/Interim Summary: ?88 y/o female with CAD, HTN, hypothyroidism fell and was unable to stand after the fall.  ?Xray pelvis showed acute comminuted and displaced periprosthetic fracture of the proximal right femur.  Orthopedic surgery was consulted advised nonoperative management with partial weightbearing, seen by PT OT and planning for skilled nursing facility.  At this time she remains medically stable for discharge awaiting for placement. ? ? ?Discharge Diagnoses:  ?Principal Problem: ?  Closed right hip fracture ([place]) ?Active Problems: ?  Hypertension ?  Coronary atherosclerosis of native coronary artery ?  Hyperlipidemia ?  Hypothyroidism ?  GERD (gastroesophageal reflux disease) ?  UTI (urinary tract infection) ?  Foot pain, right ?  Anemia of chronic disease ? ?Closed right hip fracture/periprosthetic fracture ?Trochanteric avulsion fracture ? Acute hematomas in the hip and thigh: ?Conservative management partial weightbearing as per orthopedics pain control.  Okay for discharge.  Check CBC in a week ?Addendum H&H came back and dropped to 7.6 g suspect acute blood loss anemia in the setting of chronic anemia with a stable fluid collection around the fracture site.  Discussed with patient and daughter they have agreed for blood transfusion-last transfusion was back in January 2 units. ?Hb improved to 9.6 with 1 unit today 3/23. Check cbc in 1 wk at snf ?Recent  Labs  ?Lab 05/12/21 ?1439 05/13/21 ?0455 05/15/21 ?1045 05/15/21 ?1845 05/16/21 ?1157  ?HGB 9.3* 8.3* 7.6* 9.0* 9.6*  ?HCT 28.1* 25.8* 22.6* 26.8* 27.9*  ? ?Anemia of chronic disease: ?Hemoglobin stable at 8.3 g recheck H&H prior to discharge.  Her hemoglobin was 6.7 g on January 26 once received 2 units PRBC. Needed 1 unit prbc and hb at 9.6 today. ?  ?Right foot swelling and pain ? some is chronic since the hip surgery but their history is difficult to understand - they state the swelling resolved-but given swelling x-ray was ordered that was unrevealing.  Now not swollen  ? ?Hypertension ?Dehydration ?CAD of native coronary artery: ?No chest pain.  Blood pressure is stable.  Continue cont ASA, Lipitor, Zetia, Metoprolol ?- holding Lasix as she has not been eating and doing well since her hip surgery resume in 5 to 7 days ? Hypothyroidism:cont synthroid ?UTI ruled out antibiotic discontinued ?  ?Consults: ?Orthopedics ?Subjective: ?Aaox3, resting no complaints. ? Stable for dc to snf ? ?Discharge Exam: ?Vitals:  ? 05/15/21 2124 05/16/21 0347  ?BP: 123/69 (!) 146/56  ?Pulse: 73 82  ?Resp: 14 13  ?Temp: 98.9 ?F (37.2 ?C) 98.4 ?F (36.9 ?C)  ?SpO2: 98% 96%  ? ?General: Pt is alert, awake, not in acute distress ?Cardiovascular: RRR, S1/S2 +, no rubs, no gallops ?Respiratory: CTA bilaterally, no wheezing, no rhonchi ?Abdominal: Soft, NT, ND, bowel sounds + ?Extremities: no edema, no cyanosis ? ?Discharge Instructions ? ?Discharge Instructions   ? ? Discharge instructions   Complete by: As directed ?  ?  CBC in1 wk ?Continue partial weightbearing on the right leg follow-up with x-ray with Dr. Lyla Glassing in 2 weeks ? ? ?Please call call MD or return to ER for similar or worsening recurring problem that brought you to hospital or if any fever,nausea/vomiting,abdominal pain, uncontrolled pain, chest pain,  shortness of breath or any other alarming symptoms. ? ?Please follow-up your doctor as instructed in a week time and call  the office for appointment. ? ?Please avoid alcohol, smoking, or any other illicit substance and maintain healthy habits including taking your regular medications as prescribed. ? ?You were cared for by a hospitalist during your hospital stay. If you have any questions about your discharge medications or the care you received while you were in the hospital after you are discharged, you can call the unit and ask to speak with the hospitalist on call if the hospitalist that took care of you is not available. ? ?Once you are discharged, your primary care physician will handle any further medical issues. Please note that NO REFILLS for any discharge medications will be authorized once you are discharged, as it is imperative that you return to your primary care physician (or establish a relationship with a primary care physician if you do not have one) for your aftercare needs so that they can reassess your need for medications and monitor your lab values  ? Increase activity slowly   Complete by: As directed ?  ? ?  ? ?Allergies as of 05/16/2021   ?No Known Allergies ?  ? ?  ?Medication List  ?  ? ?TAKE these medications   ? ?ALPRAZolam 0.25 MG tablet ?Commonly known as: Duanne Moron ?Take 1 tablet (0.25 mg total) by mouth 3 (three) times daily as needed for up to 5 doses for anxiety. ?  ?aspirin EC 81 MG tablet ?Take 81 mg by mouth daily. Swallow whole. ?  ?atorvastatin 80 MG tablet ?Commonly known as: LIPITOR ?Take 80 mg by mouth at bedtime. ?  ?azelastine 0.05 % ophthalmic solution ?Commonly known as: OPTIVAR ?Place 1 drop into both eyes daily as needed for dry eyes. ?  ?CALCIUM 500 + D PO ?Take 1 tablet by mouth daily. ?  ?cholecalciferol 25 MCG (1000 UNIT) tablet ?Commonly known as: VITAMIN D3 ?Take 2,000 Units by mouth daily. ?  ?ezetimibe 10 MG tablet ?Commonly known as: ZETIA ?Take 10 mg by mouth daily. ?  ?Fiber Powd ?Take 1 Scoop by mouth daily. ?  ?furosemide 40 MG tablet ?Commonly known as: LASIX ?Take 1 tablet (40  mg total) by mouth daily. ?Start taking on: May 19, 2021 ?What changed: These instructions start on May 19, 2021. If you are unsure what to do until then, ask your doctor or other care provider. ?  ?HYDROcodone-acetaminophen 5-325 MG tablet ?Commonly known as: NORCO/VICODIN ?Take 1 tablet by mouth every 8 (eight) hours as needed for up to 4 doses for moderate pain or severe pain. ?  ?levothyroxine 75 MCG tablet ?Commonly known as: SYNTHROID ?Take 75 mcg by mouth daily. ?  ?lisinopril 10 MG tablet ?Commonly known as: ZESTRIL ?Take 10 mg by mouth at bedtime. ?  ?methocarbamol 500 MG tablet ?Commonly known as: ROBAXIN ?Take 1 tablet (500 mg total) by mouth every 6 (six) hours as needed for muscle spasms. ?  ?metoprolol succinate 50 MG 24 hr tablet ?Commonly known as: TOPROL-XL ?Take 50 mg by mouth daily. ?  ?multivitamin with minerals Tabs tablet ?Take 1 tablet by mouth daily. ?  ?nitroGLYCERIN 0.4 MG SL  tablet ?Commonly known as: NITROSTAT ?Place 0.4 mg under the tongue every 5 (five) minutes as needed for chest pain. ?  ?omeprazole 20 MG capsule ?Commonly known as: PRILOSEC ?Take 20 mg by mouth daily. ?  ?ondansetron 4 MG tablet ?Commonly known as: ZOFRAN ?Take 1 tablet (4 mg total) by mouth every 6 (six) hours as needed for nausea. ?  ?pregabalin 75 MG capsule ?Commonly known as: LYRICA ?Take 1 capsule (75 mg total) by mouth 2 (two) times daily for 4 doses. ?  ?PRESERVISION AREDS PO ?Take 1 capsule by mouth daily. ?  ? ?  ? ? Contact information for after-discharge care   ? ? Destination   ? ? [facility]erred SNF .   ?Service: Skilled Nursing ?Contact information: ?[address] ?[place] ?[phone] ? ?  ?  ? ?  ?  ? ?  ?  ? ?  ? ?No Known Allergies ? ?The results of significant diagnostics from this hospitalization (including imaging, microbiology, ancillary and laboratory) are listed below for reference.   ? ?Microbiology: ?Recent Results (from the past 240  hour(s))  ?Urine Culture     Status: None  ? Collection Time: 05/12/21  9:37 PM  ? Specimen: Urine, Clean Catch  ?Result Value Ref Range Status  ? Specimen Description   Final  ?  [place]

## 2021-05-15 NOTE — Hospital Course (Signed)
88 y/o female with CAD, HTN, hypothyroidism fell and was unable to stand after the fall.  ?Xray pelvis showed acute comminuted and displaced periprosthetic fracture of the proximal right femur.  Orthopedic surgery was consulted advised nonoperative management with partial weightbearing, seen by PT OT and planning for skilled nursing facility.  At this time she remains medically stable for discharge awaiting for placement. ? ? ?

## 2021-05-16 DIAGNOSIS — M9701XD Periprosthetic fracture around internal prosthetic right hip joint, subsequent encounter: Secondary | ICD-10-CM | POA: Diagnosis not present

## 2021-05-16 DIAGNOSIS — K219 Gastro-esophageal reflux disease without esophagitis: Secondary | ICD-10-CM | POA: Diagnosis not present

## 2021-05-16 DIAGNOSIS — C541 Malignant neoplasm of endometrium: Secondary | ICD-10-CM | POA: Diagnosis not present

## 2021-05-16 DIAGNOSIS — W1789XA Other fall from one level to another, initial encounter: Secondary | ICD-10-CM | POA: Diagnosis not present

## 2021-05-16 DIAGNOSIS — M6281 Muscle weakness (generalized): Secondary | ICD-10-CM | POA: Diagnosis not present

## 2021-05-16 DIAGNOSIS — M79671 Pain in right foot: Secondary | ICD-10-CM | POA: Diagnosis not present

## 2021-05-16 DIAGNOSIS — R531 Weakness: Secondary | ICD-10-CM | POA: Diagnosis not present

## 2021-05-16 DIAGNOSIS — R279 Unspecified lack of coordination: Secondary | ICD-10-CM | POA: Diagnosis not present

## 2021-05-16 DIAGNOSIS — E039 Hypothyroidism, unspecified: Secondary | ICD-10-CM | POA: Diagnosis not present

## 2021-05-16 DIAGNOSIS — M25551 Pain in right hip: Secondary | ICD-10-CM | POA: Diagnosis not present

## 2021-05-16 DIAGNOSIS — I10 Essential (primary) hypertension: Secondary | ICD-10-CM | POA: Diagnosis not present

## 2021-05-16 DIAGNOSIS — Z7401 Bed confinement status: Secondary | ICD-10-CM | POA: Diagnosis not present

## 2021-05-16 DIAGNOSIS — R404 Transient alteration of awareness: Secondary | ICD-10-CM | POA: Diagnosis not present

## 2021-05-16 DIAGNOSIS — S728X1D Other fracture of right femur, subsequent encounter for closed fracture with routine healing: Secondary | ICD-10-CM | POA: Diagnosis not present

## 2021-05-16 DIAGNOSIS — D464 Refractory anemia, unspecified: Secondary | ICD-10-CM | POA: Diagnosis not present

## 2021-05-16 DIAGNOSIS — I447 Left bundle-branch block, unspecified: Secondary | ICD-10-CM | POA: Diagnosis not present

## 2021-05-16 DIAGNOSIS — E785 Hyperlipidemia, unspecified: Secondary | ICD-10-CM | POA: Diagnosis not present

## 2021-05-16 DIAGNOSIS — Z96641 Presence of right artificial hip joint: Secondary | ICD-10-CM | POA: Diagnosis not present

## 2021-05-16 DIAGNOSIS — E871 Hypo-osmolality and hyponatremia: Secondary | ICD-10-CM | POA: Diagnosis not present

## 2021-05-16 DIAGNOSIS — I2510 Atherosclerotic heart disease of native coronary artery without angina pectoris: Secondary | ICD-10-CM | POA: Diagnosis not present

## 2021-05-16 DIAGNOSIS — D638 Anemia in other chronic diseases classified elsewhere: Secondary | ICD-10-CM | POA: Diagnosis not present

## 2021-05-16 DIAGNOSIS — R41841 Cognitive communication deficit: Secondary | ICD-10-CM | POA: Diagnosis not present

## 2021-05-16 DIAGNOSIS — Z9181 History of falling: Secondary | ICD-10-CM | POA: Diagnosis not present

## 2021-05-16 DIAGNOSIS — Z471 Aftercare following joint replacement surgery: Secondary | ICD-10-CM | POA: Diagnosis not present

## 2021-05-16 DIAGNOSIS — Z4789 Encounter for other orthopedic aftercare: Secondary | ICD-10-CM | POA: Diagnosis not present

## 2021-05-16 DIAGNOSIS — R2681 Unsteadiness on feet: Secondary | ICD-10-CM | POA: Diagnosis not present

## 2021-05-16 DIAGNOSIS — I351 Nonrheumatic aortic (valve) insufficiency: Secondary | ICD-10-CM | POA: Diagnosis not present

## 2021-05-16 LAB — TYPE AND SCREEN
ABO/RH(D): A POS
Antibody Screen: NEGATIVE
Unit division: 0

## 2021-05-16 LAB — CBC
HCT: 27.9 % — ABNORMAL LOW (ref 36.0–46.0)
Hemoglobin: 9.6 g/dL — ABNORMAL LOW (ref 12.0–15.0)
MCH: 30.8 pg (ref 26.0–34.0)
MCHC: 34.4 g/dL (ref 30.0–36.0)
MCV: 89.4 fL (ref 80.0–100.0)
Platelets: 218 10*3/uL (ref 150–400)
RBC: 3.12 MIL/uL — ABNORMAL LOW (ref 3.87–5.11)
RDW: 14.6 % (ref 11.5–15.5)
WBC: 5.9 10*3/uL (ref 4.0–10.5)
nRBC: 0 % (ref 0.0–0.2)

## 2021-05-16 LAB — BPAM RBC
Blood Product Expiration Date: 202304092359
ISSUE DATE / TIME: 202303221412
Unit Type and Rh: 6200

## 2021-05-16 NOTE — Progress Notes (Signed)
Patient seen and examined.  She is resting comfortably pain is controlled ?Her hemoglobin has improved to 9.6 g after 1 unit PRBC transfusion. ?She has a skilled nursing facility bed available today and will be discharged home. ?Discussed during meeting with social worker and daughter will be called by the team to inform. ?I have informed daughter for d/c after prbc transfusion. ?Discharge summary updated, ?

## 2021-05-16 NOTE — Plan of Care (Signed)

## 2021-05-16 NOTE — TOC Transition Note (Signed)
Transition of Care (TOC) - CM/SW Discharge Note ? ? ?Patient Details  ?Name: Brittany Santiago ?MRN: [id number] ?Date of Birth: 1931-01-02 ? ?Transition of Care (TOC) CM/SW Contact:  ?Trish Mage, LCSW ?Phone Number: ?05/16/2021, 10:27 AM ? ? ?Clinical Narrative:   Patient who is stable for d/c will transfer to [facility] today.  Family informed. PTAR arranged. Nursing, please call report to [phone]. TOC sign off. ? ? ? ?Final next level of care: [place] ?Barriers to Discharge: Barriers Resolved ? ? ?Patient Goals and CMS Choice ?Patient states their goals for this hospitalization and ongoing recovery are:: go home ?CMS Medicare.gov Compare Post Acute Care list provided to:: Patient ?Choice offered to / list presented to : Patient, Adult Children ? ?Discharge Placement ?  ?           ?  ?  ?  ?  ? ?Discharge Plan and Services ?  ?Discharge Planning Services: CM Consult ?Post Acute Care Choice: [place]          ?  ?  ?  ?  ?  ?  ?  ?  ?  ?  ? ?Social Determinants of Health (SDOH) Interventions ?  ? ? ?Readmission Risk Interventions ?   ? View : No data to display.  ?  ?  ?  ? ? ? ? ? ?

## 2021-05-17 DIAGNOSIS — I447 Left bundle-branch block, unspecified: Secondary | ICD-10-CM | POA: Diagnosis not present

## 2021-05-17 DIAGNOSIS — E785 Hyperlipidemia, unspecified: Secondary | ICD-10-CM | POA: Diagnosis not present

## 2021-05-17 DIAGNOSIS — I2510 Atherosclerotic heart disease of native coronary artery without angina pectoris: Secondary | ICD-10-CM | POA: Diagnosis not present

## 2021-05-17 DIAGNOSIS — I10 Essential (primary) hypertension: Secondary | ICD-10-CM | POA: Diagnosis not present

## 2021-05-20 DIAGNOSIS — S728X1D Other fracture of right femur, subsequent encounter for closed fracture with routine healing: Secondary | ICD-10-CM | POA: Diagnosis not present

## 2021-05-20 DIAGNOSIS — I10 Essential (primary) hypertension: Secondary | ICD-10-CM | POA: Diagnosis not present

## 2021-05-20 DIAGNOSIS — E039 Hypothyroidism, unspecified: Secondary | ICD-10-CM | POA: Diagnosis not present

## 2021-05-20 DIAGNOSIS — E871 Hypo-osmolality and hyponatremia: Secondary | ICD-10-CM | POA: Diagnosis not present

## 2021-05-21 DIAGNOSIS — E039 Hypothyroidism, unspecified: Secondary | ICD-10-CM | POA: Diagnosis not present

## 2021-05-21 DIAGNOSIS — S728X1D Other fracture of right femur, subsequent encounter for closed fracture with routine healing: Secondary | ICD-10-CM | POA: Diagnosis not present

## 2021-05-21 DIAGNOSIS — I351 Nonrheumatic aortic (valve) insufficiency: Secondary | ICD-10-CM | POA: Diagnosis not present

## 2021-05-21 DIAGNOSIS — I10 Essential (primary) hypertension: Secondary | ICD-10-CM | POA: Diagnosis not present

## 2021-05-22 ENCOUNTER — Other Ambulatory Visit: Payer: Self-pay | Admitting: *Deleted

## 2021-05-22 NOTE — Patient Outreach (Addendum)
Per [place] eligible member currently resides in  [facility]  Screened for potential [facility] Care Management services as a benefit of member's insurance plan. ? ?Mrs. Mudgett admitted to SNF on 05/17/21 after hospitalization. ? ?Facility site visit to [facility] skilled nursing facility. Met with Marita Kansas, SNF SW concerning transition plan and potential THN needs. Mrs. Lyles is from home alone. She was independent prior. Has supportive daughter. Anticipated transition plan is to initially go home with daughter post SNF.  ? ?Went to Mrs. Baldwin's room at [facility] to discuss  [place] Management services. However, she was off the unit.  ? ?Will continue follow transition plans/needs while member resides in SNF.  ? ? ?Marthenia Rolling, MSN, RN,BSN ?[place] Coordinator ?[phone] [facility]) ?[phone]  (Toll free office)  ? ? ? ? ?  ?

## 2021-05-24 DIAGNOSIS — I2510 Atherosclerotic heart disease of native coronary artery without angina pectoris: Secondary | ICD-10-CM | POA: Diagnosis not present

## 2021-05-24 DIAGNOSIS — I10 Essential (primary) hypertension: Secondary | ICD-10-CM | POA: Diagnosis not present

## 2021-05-24 DIAGNOSIS — S728X1D Other fracture of right femur, subsequent encounter for closed fracture with routine healing: Secondary | ICD-10-CM | POA: Diagnosis not present

## 2021-05-27 DIAGNOSIS — E039 Hypothyroidism, unspecified: Secondary | ICD-10-CM | POA: Diagnosis not present

## 2021-05-27 DIAGNOSIS — I10 Essential (primary) hypertension: Secondary | ICD-10-CM | POA: Diagnosis not present

## 2021-05-27 DIAGNOSIS — S728X1D Other fracture of right femur, subsequent encounter for closed fracture with routine healing: Secondary | ICD-10-CM | POA: Diagnosis not present

## 2021-05-27 DIAGNOSIS — I351 Nonrheumatic aortic (valve) insufficiency: Secondary | ICD-10-CM | POA: Diagnosis not present

## 2021-05-29 ENCOUNTER — Other Ambulatory Visit: Payer: Self-pay | Admitting: *Deleted

## 2021-05-29 NOTE — Patient Outreach (Signed)
THN Post- Acute Care Coordinator follow up. Per [facility] THN eligible member currently resides in [facility].  Screening for potential [facility] Care Management services as a benefit of member's insurance plan. ? ?Facility site visit to [facility] skilled nursing facility. Met with Mrs. Reppucci and daughter Teresa to discuss transition plans and THN follow up. Transition plan is to stay at daughter Teresa Danco's home. Both Mrs. Sivley and Teresa are agreeable to [facility] Care Management follow up. Discussed writer will continue to follow along and plan THN Care Management referral upon SNF discharge. ? ?Confirmed PCP is Dr. Schultz with [facility] ?Confirmed best contact number for Mrs. Persing is Teresa Danco (daughter/DPR) at [phone]. ? ?Provided THN Care Management brochure, 24-hr nurse advice line magnet, and writer's contact information.  ? ?Will continue to collaborate with SNF SW and follow transition plans/needs/date while member resides in SNF.  ? ? ? , MSN, RN,BSN ?THN Post Acute Care Coordinator ?[phone] ( [employer]) ?[phone]  (Toll free office)  ? ? ? ? ?  ?

## 2021-06-03 DIAGNOSIS — S728X1D Other fracture of right femur, subsequent encounter for closed fracture with routine healing: Secondary | ICD-10-CM | POA: Diagnosis not present

## 2021-06-03 DIAGNOSIS — D464 Refractory anemia, unspecified: Secondary | ICD-10-CM | POA: Diagnosis not present

## 2021-06-04 DIAGNOSIS — M6281 Muscle weakness (generalized): Secondary | ICD-10-CM | POA: Diagnosis not present

## 2021-06-04 DIAGNOSIS — Z9181 History of falling: Secondary | ICD-10-CM | POA: Diagnosis not present

## 2021-06-04 DIAGNOSIS — Z96641 Presence of right artificial hip joint: Secondary | ICD-10-CM | POA: Diagnosis not present

## 2021-06-04 DIAGNOSIS — S728X1D Other fracture of right femur, subsequent encounter for closed fracture with routine healing: Secondary | ICD-10-CM | POA: Diagnosis not present

## 2021-06-04 DIAGNOSIS — M9701XD Periprosthetic fracture around internal prosthetic right hip joint, subsequent encounter: Secondary | ICD-10-CM | POA: Diagnosis not present

## 2021-06-12 ENCOUNTER — Other Ambulatory Visit: Payer: Self-pay | Admitting: *Deleted

## 2021-06-12 DIAGNOSIS — E871 Hypo-osmolality and hyponatremia: Secondary | ICD-10-CM | POA: Diagnosis not present

## 2021-06-12 NOTE — Patient Outreach (Signed)
THN Post- Acute Care Coordinator follow up. Per [facility] THN eligible member currently resides in [facility].  Screened for potential [facility] Care Management services as a benefit of member's insurance plan. ? ?Facility site visit to [facility] skilled nursing facility. Met with Kristy, SNF SW concerning  transition plan/date. Kristy reports member has ortho follow up scheduled 06/17/21. Anticipated transition plan is to return home with daughter if needed.  ? ?Went to Mrs. Hinshsaw's room. However, she was working with therapy. Previously on 05/29/21 both Mrs. Kanode and daughter Teresa were agreeable to THN Care Management services.  ? ?Will continue to follow and plan THN Care Management referral post SNF. ? ? ? , MSN, RN,BSN ?THN Post Acute Care Coordinator ?[phone] ( [employer]) ?[phone]  (Toll free office)  ? ?

## 2021-06-17 DIAGNOSIS — Z4789 Encounter for other orthopedic aftercare: Secondary | ICD-10-CM | POA: Diagnosis not present

## 2021-06-17 DIAGNOSIS — Z96641 Presence of right artificial hip joint: Secondary | ICD-10-CM | POA: Diagnosis not present

## 2021-06-17 DIAGNOSIS — Z471 Aftercare following joint replacement surgery: Secondary | ICD-10-CM | POA: Diagnosis not present

## 2021-06-18 ENCOUNTER — Other Ambulatory Visit: Payer: Self-pay | Admitting: *Deleted

## 2021-06-18 DIAGNOSIS — D464 Refractory anemia, unspecified: Secondary | ICD-10-CM | POA: Diagnosis not present

## 2021-06-18 DIAGNOSIS — E871 Hypo-osmolality and hyponatremia: Secondary | ICD-10-CM | POA: Diagnosis not present

## 2021-06-18 DIAGNOSIS — I10 Essential (primary) hypertension: Secondary | ICD-10-CM

## 2021-06-18 DIAGNOSIS — S728X1D Other fracture of right femur, subsequent encounter for closed fracture with routine healing: Secondary | ICD-10-CM | POA: Diagnosis not present

## 2021-06-18 NOTE — Patient Outreach (Addendum)
THN Post- Acute Care Coordinator follow up. Member screened for potential [facility] services as a a benefit of Mrs. [employer] plan.  ? ?Spoke with Kristy,SNF SW who reports member transitioned from [facility] to daughter's home on today 06/18/21. Mrs. Yinger will have [place] for PT/OT services.  ? ?Writer previously spoke with Mrs. Hatton and daughter Helene Kelp on 05/29/21 at [facility]. They were agreeable to [place] Management follow up at that time. ? ?Confirmed best contact number for Mrs. Benner is Piedad Climes (daughter/DPR) at [phone]. ? ?Will make [place] Management referral for [facility] for care coordination. ? ?Member has medical history of CAD, HTN, HLD, s/p right hip fracture. ? ? ?Marthenia Rolling, MSN, RN,BSN ?[place] Coordinator ?[phone] [facility]) ?[phone]  (Toll free office)   ?

## 2021-06-19 NOTE — Patient Outreach (Signed)
Received a referral from Regan, MSN, RN,BSN-THN [place] Coordinator ?I have assigned Raina Mina, RN to call for follow up and determine if there are any Case Management needs.  ?  ?Arville Care, CBCS, CMAA ?[place] Management Assistant ?[place] Management ?[phone]   ?

## 2021-06-20 DIAGNOSIS — Z9181 History of falling: Secondary | ICD-10-CM | POA: Diagnosis not present

## 2021-06-20 DIAGNOSIS — W19XXXD Unspecified fall, subsequent encounter: Secondary | ICD-10-CM | POA: Diagnosis not present

## 2021-06-20 DIAGNOSIS — K219 Gastro-esophageal reflux disease without esophagitis: Secondary | ICD-10-CM | POA: Diagnosis not present

## 2021-06-20 DIAGNOSIS — I2510 Atherosclerotic heart disease of native coronary artery without angina pectoris: Secondary | ICD-10-CM | POA: Diagnosis not present

## 2021-06-20 DIAGNOSIS — E785 Hyperlipidemia, unspecified: Secondary | ICD-10-CM | POA: Diagnosis not present

## 2021-06-20 DIAGNOSIS — M9701XD Periprosthetic fracture around internal prosthetic right hip joint, subsequent encounter: Secondary | ICD-10-CM | POA: Diagnosis not present

## 2021-06-20 DIAGNOSIS — Z96641 Presence of right artificial hip joint: Secondary | ICD-10-CM | POA: Diagnosis not present

## 2021-06-20 DIAGNOSIS — E890 Postprocedural hypothyroidism: Secondary | ICD-10-CM | POA: Diagnosis not present

## 2021-06-20 DIAGNOSIS — I10 Essential (primary) hypertension: Secondary | ICD-10-CM | POA: Diagnosis not present

## 2021-06-20 DIAGNOSIS — I447 Left bundle-branch block, unspecified: Secondary | ICD-10-CM | POA: Diagnosis not present

## 2021-06-20 DIAGNOSIS — Z7982 Long term (current) use of aspirin: Secondary | ICD-10-CM | POA: Diagnosis not present

## 2021-06-20 DIAGNOSIS — K769 Liver disease, unspecified: Secondary | ICD-10-CM | POA: Diagnosis not present

## 2021-06-20 DIAGNOSIS — I352 Nonrheumatic aortic (valve) stenosis with insufficiency: Secondary | ICD-10-CM | POA: Diagnosis not present

## 2021-06-20 DIAGNOSIS — S728X1D Other fracture of right femur, subsequent encounter for closed fracture with routine healing: Secondary | ICD-10-CM | POA: Diagnosis not present

## 2021-06-21 ENCOUNTER — Other Ambulatory Visit: Payer: Self-pay | Admitting: *Deleted

## 2021-06-21 NOTE — Patient Outreach (Signed)
[place] [facility]) Care Management ? ?06/21/2021 ? ?Brittany Santiago ?01-05-1931 ?[id number] ? ?Transition of care ?Initial Outreach 4/28 ? ?RN spoke with pt's DRP daughter Piedad Climes and explained [facility] services and the purpose for today's call.  Daughter states pt has more then enough assistance and currently does not need additional monitoring or services via [facility]. Again RN explained the benefits of THN and working with her providers. Review all problems and disease management services. Daughter very appreciative but continue to decline all of the available services via [facility].  ? ?Offered to send brochure of [facility] services for possible future needs (receptive). RN will closed and notify the provider of pt's POA daughter [name] who has declined [facility] services at this time. ? ?Case closure. ? ?Raina Mina, RN ?Care Management Coordinator ?[place] ?Main Office [phone]  ?

## 2021-06-24 DIAGNOSIS — S728X1D Other fracture of right femur, subsequent encounter for closed fracture with routine healing: Secondary | ICD-10-CM | POA: Diagnosis not present

## 2021-06-24 DIAGNOSIS — W19XXXD Unspecified fall, subsequent encounter: Secondary | ICD-10-CM | POA: Diagnosis not present

## 2021-06-24 DIAGNOSIS — I2510 Atherosclerotic heart disease of native coronary artery without angina pectoris: Secondary | ICD-10-CM | POA: Diagnosis not present

## 2021-06-24 DIAGNOSIS — M9701XD Periprosthetic fracture around internal prosthetic right hip joint, subsequent encounter: Secondary | ICD-10-CM | POA: Diagnosis not present

## 2021-06-24 DIAGNOSIS — E785 Hyperlipidemia, unspecified: Secondary | ICD-10-CM | POA: Diagnosis not present

## 2021-06-24 DIAGNOSIS — I10 Essential (primary) hypertension: Secondary | ICD-10-CM | POA: Diagnosis not present

## 2021-06-24 NOTE — Patient Outreach (Signed)
[facility]) Care Management ? ?06/21/2021 ? ?Brittany Santiago ?[birth year] ?[id number] ? ? ?Referral Received 06/19/2021 ?Initial Outreach 06/21/2021 ?Unsuccessful ? ?RN attempted the initial outreach however unsuccessful to both the pt's contact and the daughter. Only able to leave a voice message with the daughter [name] requesting a call back. ? ?Will rescheduled another outreach over the next week for pending [facility] services. ? ?Raina Mina, RN ?Care Management Coordinator ?[place] ?Main Office [phone]  ?

## 2021-06-26 DIAGNOSIS — I2510 Atherosclerotic heart disease of native coronary artery without angina pectoris: Secondary | ICD-10-CM | POA: Diagnosis not present

## 2021-06-26 DIAGNOSIS — E785 Hyperlipidemia, unspecified: Secondary | ICD-10-CM | POA: Diagnosis not present

## 2021-06-26 DIAGNOSIS — I10 Essential (primary) hypertension: Secondary | ICD-10-CM | POA: Diagnosis not present

## 2021-06-26 DIAGNOSIS — E890 Postprocedural hypothyroidism: Secondary | ICD-10-CM | POA: Diagnosis not present

## 2021-06-26 DIAGNOSIS — R630 Anorexia: Secondary | ICD-10-CM | POA: Diagnosis not present

## 2021-06-26 DIAGNOSIS — D62 Acute posthemorrhagic anemia: Secondary | ICD-10-CM | POA: Diagnosis not present

## 2021-06-26 DIAGNOSIS — S72001A Fracture of unspecified part of neck of right femur, initial encounter for closed fracture: Secondary | ICD-10-CM | POA: Diagnosis not present

## 2021-06-27 DIAGNOSIS — I10 Essential (primary) hypertension: Secondary | ICD-10-CM | POA: Diagnosis not present

## 2021-06-27 DIAGNOSIS — I2510 Atherosclerotic heart disease of native coronary artery without angina pectoris: Secondary | ICD-10-CM | POA: Diagnosis not present

## 2021-06-27 DIAGNOSIS — M9701XD Periprosthetic fracture around internal prosthetic right hip joint, subsequent encounter: Secondary | ICD-10-CM | POA: Diagnosis not present

## 2021-06-27 DIAGNOSIS — E785 Hyperlipidemia, unspecified: Secondary | ICD-10-CM | POA: Diagnosis not present

## 2021-06-27 DIAGNOSIS — W19XXXD Unspecified fall, subsequent encounter: Secondary | ICD-10-CM | POA: Diagnosis not present

## 2021-06-27 DIAGNOSIS — S728X1D Other fracture of right femur, subsequent encounter for closed fracture with routine healing: Secondary | ICD-10-CM | POA: Diagnosis not present

## 2021-07-01 DIAGNOSIS — S728X1D Other fracture of right femur, subsequent encounter for closed fracture with routine healing: Secondary | ICD-10-CM | POA: Diagnosis not present

## 2021-07-01 DIAGNOSIS — I2510 Atherosclerotic heart disease of native coronary artery without angina pectoris: Secondary | ICD-10-CM | POA: Diagnosis not present

## 2021-07-01 DIAGNOSIS — E785 Hyperlipidemia, unspecified: Secondary | ICD-10-CM | POA: Diagnosis not present

## 2021-07-01 DIAGNOSIS — I10 Essential (primary) hypertension: Secondary | ICD-10-CM | POA: Diagnosis not present

## 2021-07-01 DIAGNOSIS — W19XXXD Unspecified fall, subsequent encounter: Secondary | ICD-10-CM | POA: Diagnosis not present

## 2021-07-01 DIAGNOSIS — M9701XD Periprosthetic fracture around internal prosthetic right hip joint, subsequent encounter: Secondary | ICD-10-CM | POA: Diagnosis not present

## 2021-07-02 DIAGNOSIS — I2510 Atherosclerotic heart disease of native coronary artery without angina pectoris: Secondary | ICD-10-CM | POA: Diagnosis not present

## 2021-07-02 DIAGNOSIS — I10 Essential (primary) hypertension: Secondary | ICD-10-CM | POA: Diagnosis not present

## 2021-07-02 DIAGNOSIS — E785 Hyperlipidemia, unspecified: Secondary | ICD-10-CM | POA: Diagnosis not present

## 2021-07-02 DIAGNOSIS — M9701XD Periprosthetic fracture around internal prosthetic right hip joint, subsequent encounter: Secondary | ICD-10-CM | POA: Diagnosis not present

## 2021-07-02 DIAGNOSIS — S728X1D Other fracture of right femur, subsequent encounter for closed fracture with routine healing: Secondary | ICD-10-CM | POA: Diagnosis not present

## 2021-07-02 DIAGNOSIS — W19XXXD Unspecified fall, subsequent encounter: Secondary | ICD-10-CM | POA: Diagnosis not present

## 2021-07-04 DIAGNOSIS — M9701XD Periprosthetic fracture around internal prosthetic right hip joint, subsequent encounter: Secondary | ICD-10-CM | POA: Diagnosis not present

## 2021-07-04 DIAGNOSIS — I10 Essential (primary) hypertension: Secondary | ICD-10-CM | POA: Diagnosis not present

## 2021-07-04 DIAGNOSIS — W19XXXD Unspecified fall, subsequent encounter: Secondary | ICD-10-CM | POA: Diagnosis not present

## 2021-07-04 DIAGNOSIS — S728X1D Other fracture of right femur, subsequent encounter for closed fracture with routine healing: Secondary | ICD-10-CM | POA: Diagnosis not present

## 2021-07-04 DIAGNOSIS — E785 Hyperlipidemia, unspecified: Secondary | ICD-10-CM | POA: Diagnosis not present

## 2021-07-04 DIAGNOSIS — I2510 Atherosclerotic heart disease of native coronary artery without angina pectoris: Secondary | ICD-10-CM | POA: Diagnosis not present

## 2021-07-08 DIAGNOSIS — E785 Hyperlipidemia, unspecified: Secondary | ICD-10-CM | POA: Diagnosis not present

## 2021-07-08 DIAGNOSIS — S728X1D Other fracture of right femur, subsequent encounter for closed fracture with routine healing: Secondary | ICD-10-CM | POA: Diagnosis not present

## 2021-07-08 DIAGNOSIS — I10 Essential (primary) hypertension: Secondary | ICD-10-CM | POA: Diagnosis not present

## 2021-07-08 DIAGNOSIS — M9701XD Periprosthetic fracture around internal prosthetic right hip joint, subsequent encounter: Secondary | ICD-10-CM | POA: Diagnosis not present

## 2021-07-08 DIAGNOSIS — W19XXXD Unspecified fall, subsequent encounter: Secondary | ICD-10-CM | POA: Diagnosis not present

## 2021-07-08 DIAGNOSIS — I2510 Atherosclerotic heart disease of native coronary artery without angina pectoris: Secondary | ICD-10-CM | POA: Diagnosis not present

## 2021-07-11 DIAGNOSIS — W19XXXD Unspecified fall, subsequent encounter: Secondary | ICD-10-CM | POA: Diagnosis not present

## 2021-07-11 DIAGNOSIS — I2510 Atherosclerotic heart disease of native coronary artery without angina pectoris: Secondary | ICD-10-CM | POA: Diagnosis not present

## 2021-07-11 DIAGNOSIS — S728X1D Other fracture of right femur, subsequent encounter for closed fracture with routine healing: Secondary | ICD-10-CM | POA: Diagnosis not present

## 2021-07-11 DIAGNOSIS — M9701XD Periprosthetic fracture around internal prosthetic right hip joint, subsequent encounter: Secondary | ICD-10-CM | POA: Diagnosis not present

## 2021-07-11 DIAGNOSIS — E785 Hyperlipidemia, unspecified: Secondary | ICD-10-CM | POA: Diagnosis not present

## 2021-07-11 DIAGNOSIS — I10 Essential (primary) hypertension: Secondary | ICD-10-CM | POA: Diagnosis not present

## 2021-07-15 DIAGNOSIS — W19XXXD Unspecified fall, subsequent encounter: Secondary | ICD-10-CM | POA: Diagnosis not present

## 2021-07-15 DIAGNOSIS — S728X1D Other fracture of right femur, subsequent encounter for closed fracture with routine healing: Secondary | ICD-10-CM | POA: Diagnosis not present

## 2021-07-15 DIAGNOSIS — I10 Essential (primary) hypertension: Secondary | ICD-10-CM | POA: Diagnosis not present

## 2021-07-15 DIAGNOSIS — E785 Hyperlipidemia, unspecified: Secondary | ICD-10-CM | POA: Diagnosis not present

## 2021-07-15 DIAGNOSIS — M9701XD Periprosthetic fracture around internal prosthetic right hip joint, subsequent encounter: Secondary | ICD-10-CM | POA: Diagnosis not present

## 2021-07-15 DIAGNOSIS — I2510 Atherosclerotic heart disease of native coronary artery without angina pectoris: Secondary | ICD-10-CM | POA: Diagnosis not present

## 2021-07-17 DIAGNOSIS — E785 Hyperlipidemia, unspecified: Secondary | ICD-10-CM | POA: Diagnosis not present

## 2021-07-17 DIAGNOSIS — W19XXXD Unspecified fall, subsequent encounter: Secondary | ICD-10-CM | POA: Diagnosis not present

## 2021-07-17 DIAGNOSIS — M9701XD Periprosthetic fracture around internal prosthetic right hip joint, subsequent encounter: Secondary | ICD-10-CM | POA: Diagnosis not present

## 2021-07-17 DIAGNOSIS — I2510 Atherosclerotic heart disease of native coronary artery without angina pectoris: Secondary | ICD-10-CM | POA: Diagnosis not present

## 2021-07-17 DIAGNOSIS — S728X1D Other fracture of right femur, subsequent encounter for closed fracture with routine healing: Secondary | ICD-10-CM | POA: Diagnosis not present

## 2021-07-17 DIAGNOSIS — I10 Essential (primary) hypertension: Secondary | ICD-10-CM | POA: Diagnosis not present

## 2021-07-18 DIAGNOSIS — M9701XD Periprosthetic fracture around internal prosthetic right hip joint, subsequent encounter: Secondary | ICD-10-CM | POA: Diagnosis not present

## 2021-07-18 DIAGNOSIS — S728X1D Other fracture of right femur, subsequent encounter for closed fracture with routine healing: Secondary | ICD-10-CM | POA: Diagnosis not present

## 2021-07-18 DIAGNOSIS — I2510 Atherosclerotic heart disease of native coronary artery without angina pectoris: Secondary | ICD-10-CM | POA: Diagnosis not present

## 2021-07-18 DIAGNOSIS — W19XXXD Unspecified fall, subsequent encounter: Secondary | ICD-10-CM | POA: Diagnosis not present

## 2021-07-18 DIAGNOSIS — E785 Hyperlipidemia, unspecified: Secondary | ICD-10-CM | POA: Diagnosis not present

## 2021-07-18 DIAGNOSIS — I10 Essential (primary) hypertension: Secondary | ICD-10-CM | POA: Diagnosis not present

## 2021-07-20 DIAGNOSIS — M9701XD Periprosthetic fracture around internal prosthetic right hip joint, subsequent encounter: Secondary | ICD-10-CM | POA: Diagnosis not present

## 2021-07-20 DIAGNOSIS — E785 Hyperlipidemia, unspecified: Secondary | ICD-10-CM | POA: Diagnosis not present

## 2021-07-20 DIAGNOSIS — K769 Liver disease, unspecified: Secondary | ICD-10-CM | POA: Diagnosis not present

## 2021-07-20 DIAGNOSIS — I352 Nonrheumatic aortic (valve) stenosis with insufficiency: Secondary | ICD-10-CM | POA: Diagnosis not present

## 2021-07-20 DIAGNOSIS — I447 Left bundle-branch block, unspecified: Secondary | ICD-10-CM | POA: Diagnosis not present

## 2021-07-20 DIAGNOSIS — K219 Gastro-esophageal reflux disease without esophagitis: Secondary | ICD-10-CM | POA: Diagnosis not present

## 2021-07-20 DIAGNOSIS — S728X1D Other fracture of right femur, subsequent encounter for closed fracture with routine healing: Secondary | ICD-10-CM | POA: Diagnosis not present

## 2021-07-20 DIAGNOSIS — W19XXXD Unspecified fall, subsequent encounter: Secondary | ICD-10-CM | POA: Diagnosis not present

## 2021-07-20 DIAGNOSIS — I10 Essential (primary) hypertension: Secondary | ICD-10-CM | POA: Diagnosis not present

## 2021-07-20 DIAGNOSIS — I2510 Atherosclerotic heart disease of native coronary artery without angina pectoris: Secondary | ICD-10-CM | POA: Diagnosis not present

## 2021-07-20 DIAGNOSIS — Z9181 History of falling: Secondary | ICD-10-CM | POA: Diagnosis not present

## 2021-07-20 DIAGNOSIS — E890 Postprocedural hypothyroidism: Secondary | ICD-10-CM | POA: Diagnosis not present

## 2021-07-20 DIAGNOSIS — Z7982 Long term (current) use of aspirin: Secondary | ICD-10-CM | POA: Diagnosis not present

## 2021-07-20 DIAGNOSIS — Z96641 Presence of right artificial hip joint: Secondary | ICD-10-CM | POA: Diagnosis not present

## 2021-07-23 DIAGNOSIS — W19XXXD Unspecified fall, subsequent encounter: Secondary | ICD-10-CM | POA: Diagnosis not present

## 2021-07-23 DIAGNOSIS — M9701XD Periprosthetic fracture around internal prosthetic right hip joint, subsequent encounter: Secondary | ICD-10-CM | POA: Diagnosis not present

## 2021-07-23 DIAGNOSIS — I2510 Atherosclerotic heart disease of native coronary artery without angina pectoris: Secondary | ICD-10-CM | POA: Diagnosis not present

## 2021-07-23 DIAGNOSIS — S728X1D Other fracture of right femur, subsequent encounter for closed fracture with routine healing: Secondary | ICD-10-CM | POA: Diagnosis not present

## 2021-07-23 DIAGNOSIS — E785 Hyperlipidemia, unspecified: Secondary | ICD-10-CM | POA: Diagnosis not present

## 2021-07-23 DIAGNOSIS — I10 Essential (primary) hypertension: Secondary | ICD-10-CM | POA: Diagnosis not present

## 2021-07-25 DIAGNOSIS — W19XXXD Unspecified fall, subsequent encounter: Secondary | ICD-10-CM | POA: Diagnosis not present

## 2021-07-25 DIAGNOSIS — I10 Essential (primary) hypertension: Secondary | ICD-10-CM | POA: Diagnosis not present

## 2021-07-25 DIAGNOSIS — M9701XD Periprosthetic fracture around internal prosthetic right hip joint, subsequent encounter: Secondary | ICD-10-CM | POA: Diagnosis not present

## 2021-07-25 DIAGNOSIS — S728X1D Other fracture of right femur, subsequent encounter for closed fracture with routine healing: Secondary | ICD-10-CM | POA: Diagnosis not present

## 2021-07-25 DIAGNOSIS — E785 Hyperlipidemia, unspecified: Secondary | ICD-10-CM | POA: Diagnosis not present

## 2021-07-25 DIAGNOSIS — I2510 Atherosclerotic heart disease of native coronary artery without angina pectoris: Secondary | ICD-10-CM | POA: Diagnosis not present

## 2021-07-29 DIAGNOSIS — Z96641 Presence of right artificial hip joint: Secondary | ICD-10-CM | POA: Diagnosis not present

## 2021-07-29 DIAGNOSIS — Z471 Aftercare following joint replacement surgery: Secondary | ICD-10-CM | POA: Diagnosis not present

## 2021-07-29 DIAGNOSIS — M79673 Pain in unspecified foot: Secondary | ICD-10-CM | POA: Diagnosis not present

## 2021-07-30 DIAGNOSIS — W19XXXD Unspecified fall, subsequent encounter: Secondary | ICD-10-CM | POA: Diagnosis not present

## 2021-07-30 DIAGNOSIS — E785 Hyperlipidemia, unspecified: Secondary | ICD-10-CM | POA: Diagnosis not present

## 2021-07-30 DIAGNOSIS — I2510 Atherosclerotic heart disease of native coronary artery without angina pectoris: Secondary | ICD-10-CM | POA: Diagnosis not present

## 2021-07-30 DIAGNOSIS — I10 Essential (primary) hypertension: Secondary | ICD-10-CM | POA: Diagnosis not present

## 2021-07-30 DIAGNOSIS — M9701XD Periprosthetic fracture around internal prosthetic right hip joint, subsequent encounter: Secondary | ICD-10-CM | POA: Diagnosis not present

## 2021-07-30 DIAGNOSIS — E890 Postprocedural hypothyroidism: Secondary | ICD-10-CM | POA: Diagnosis not present

## 2021-07-30 DIAGNOSIS — S728X1D Other fracture of right femur, subsequent encounter for closed fracture with routine healing: Secondary | ICD-10-CM | POA: Diagnosis not present

## 2021-08-01 DIAGNOSIS — I2510 Atherosclerotic heart disease of native coronary artery without angina pectoris: Secondary | ICD-10-CM | POA: Diagnosis not present

## 2021-08-01 DIAGNOSIS — S728X1D Other fracture of right femur, subsequent encounter for closed fracture with routine healing: Secondary | ICD-10-CM | POA: Diagnosis not present

## 2021-08-01 DIAGNOSIS — E785 Hyperlipidemia, unspecified: Secondary | ICD-10-CM | POA: Diagnosis not present

## 2021-08-01 DIAGNOSIS — W19XXXD Unspecified fall, subsequent encounter: Secondary | ICD-10-CM | POA: Diagnosis not present

## 2021-08-01 DIAGNOSIS — I10 Essential (primary) hypertension: Secondary | ICD-10-CM | POA: Diagnosis not present

## 2021-08-01 DIAGNOSIS — M9701XD Periprosthetic fracture around internal prosthetic right hip joint, subsequent encounter: Secondary | ICD-10-CM | POA: Diagnosis not present

## 2021-08-05 DIAGNOSIS — E785 Hyperlipidemia, unspecified: Secondary | ICD-10-CM | POA: Diagnosis not present

## 2021-08-05 DIAGNOSIS — W19XXXD Unspecified fall, subsequent encounter: Secondary | ICD-10-CM | POA: Diagnosis not present

## 2021-08-05 DIAGNOSIS — S728X1D Other fracture of right femur, subsequent encounter for closed fracture with routine healing: Secondary | ICD-10-CM | POA: Diagnosis not present

## 2021-08-05 DIAGNOSIS — I2510 Atherosclerotic heart disease of native coronary artery without angina pectoris: Secondary | ICD-10-CM | POA: Diagnosis not present

## 2021-08-05 DIAGNOSIS — M9701XD Periprosthetic fracture around internal prosthetic right hip joint, subsequent encounter: Secondary | ICD-10-CM | POA: Diagnosis not present

## 2021-08-05 DIAGNOSIS — I10 Essential (primary) hypertension: Secondary | ICD-10-CM | POA: Diagnosis not present

## 2021-08-08 DIAGNOSIS — S728X1D Other fracture of right femur, subsequent encounter for closed fracture with routine healing: Secondary | ICD-10-CM | POA: Diagnosis not present

## 2021-08-08 DIAGNOSIS — M9701XD Periprosthetic fracture around internal prosthetic right hip joint, subsequent encounter: Secondary | ICD-10-CM | POA: Diagnosis not present

## 2021-08-08 DIAGNOSIS — E785 Hyperlipidemia, unspecified: Secondary | ICD-10-CM | POA: Diagnosis not present

## 2021-08-08 DIAGNOSIS — I2510 Atherosclerotic heart disease of native coronary artery without angina pectoris: Secondary | ICD-10-CM | POA: Diagnosis not present

## 2021-08-08 DIAGNOSIS — W19XXXD Unspecified fall, subsequent encounter: Secondary | ICD-10-CM | POA: Diagnosis not present

## 2021-08-08 DIAGNOSIS — I10 Essential (primary) hypertension: Secondary | ICD-10-CM | POA: Diagnosis not present

## 2021-08-12 ENCOUNTER — Ambulatory Visit (INDEPENDENT_AMBULATORY_CARE_PROVIDER_SITE_OTHER): Payer: Medicare Other | Admitting: Cardiology

## 2021-08-12 ENCOUNTER — Encounter: Payer: Self-pay | Admitting: Cardiology

## 2021-08-12 VITALS — BP 136/68 | HR 76 | Ht 60.0 in | Wt 126.8 lb

## 2021-08-12 DIAGNOSIS — I350 Nonrheumatic aortic (valve) stenosis: Secondary | ICD-10-CM | POA: Diagnosis not present

## 2021-08-12 DIAGNOSIS — M9701XD Periprosthetic fracture around internal prosthetic right hip joint, subsequent encounter: Secondary | ICD-10-CM | POA: Diagnosis not present

## 2021-08-12 DIAGNOSIS — I2510 Atherosclerotic heart disease of native coronary artery without angina pectoris: Secondary | ICD-10-CM

## 2021-08-12 DIAGNOSIS — E785 Hyperlipidemia, unspecified: Secondary | ICD-10-CM | POA: Diagnosis not present

## 2021-08-12 DIAGNOSIS — I351 Nonrheumatic aortic (valve) insufficiency: Secondary | ICD-10-CM

## 2021-08-12 DIAGNOSIS — I10 Essential (primary) hypertension: Secondary | ICD-10-CM | POA: Diagnosis not present

## 2021-08-12 DIAGNOSIS — Z951 Presence of aortocoronary bypass graft: Secondary | ICD-10-CM

## 2021-08-12 DIAGNOSIS — S728X1D Other fracture of right femur, subsequent encounter for closed fracture with routine healing: Secondary | ICD-10-CM | POA: Diagnosis not present

## 2021-08-12 DIAGNOSIS — W19XXXD Unspecified fall, subsequent encounter: Secondary | ICD-10-CM | POA: Diagnosis not present

## 2021-08-12 MED ORDER — ATORVASTATIN CALCIUM 40 MG PO TABS: 40.0000 mg | ORAL_TABLET | Freq: Every day | ORAL | 3 refills | Status: DC

## 2021-08-12 NOTE — Progress Notes (Signed)
Cardiology Office Note:    Date:  08/12/2021   ID:  Brittany Santiago, DOB [birth year], MRN [id number]  PCP:  Nicoletta Dress, MD  Cardiologist:  Jenean Lindau, MD   Referring MD: Nicoletta Dress, MD    ASSESSMENT:    1. Atherosclerosis of native coronary artery of native heart without angina pectoris   2. Mild aortic valve regurgitation   3. Mild aortic valve stenosis   4. Dyslipidemia   5. S/P CABG x 2    PLAN:    In order of problems listed above:  Coronary artery disease: Secondary prevention stressed with the patient.  Importance of compliance with diet and medication stressed and she vocalized understanding.  She was advised to be ambulating and active to the best of her ability. Essential hypertension: Blood pressure stable and diet was emphasized and lifestyle modification urged. Mild aortic stenosis and regurgitation: Stable at this time.  Medical management and I discussed this with her. Mixed dyslipidemia: Diet was emphasized.  Lipids were reviewed from [facility] sheet and found to be fine. Other comorbidities such as renal insufficiency, anemia followed by primary care Patient will be seen in follow-up appointment in 6 months or earlier if the patient has any concerns    Medication Adjustments/Labs and Tests Ordered: Current medicines are reviewed at length with the patient today.  Concerns regarding medicines are outlined above.  No orders of the defined types were placed in this encounter.  No orders of the defined types were placed in this encounter.    No chief complaint on file.    History of Present Illness:    Brittany Santiago is a 88 y.o. female.  Patient has past medical history of coronary artery disease post CABG surgery, essential hypertension, dyslipidemia, mild aortic stenosis and regurgitation.  She denies any problems at this time and takes care of activities of daily living.  She is trying to ambulate post hip replacement and subsequent fall and  fracture for which she has had surgery.  No chest pain orthopnea or PND.  At the time of my evaluation, the patient is alert awake oriented and in no distress.  On the blood work on the [employer] she was found to be anemic.  Past Medical History:  Diagnosis Date   Anemia of chronic disease 05/15/2021   Closed right hip fracture ([place]) 05/12/2021   Coronary atherosclerosis of native coronary artery 03/16/2015   Dyslipidemia 03/10/2017   Endometrial cancer ([place]) 05/27/2017   Enterocele 12/30/2017   Failed orthopedic implant, initial encounter ([place]) 03/20/2021   Foot pain, right    GERD (gastroesophageal reflux disease)    History of hemiarthroplasty of right hip 01/08/2021   Hyperlipidemia    Hypertension    Hypothyroidism    Left bundle branch block (LBBB) 03/04/2017   Malignant neoplasm of endometrium of corpus uteri ([place]) 05/27/2017   Mechanical complication of internal joint prosthesis ([place]) 03/20/2021   Menopausal symptom 05/29/2017   Mild aortic regurgitation 11/15/2019   Mild aortic stenosis 11/15/2019   Mild aortic valve regurgitation 11/15/2019   Mild aortic valve stenosis 11/15/2019   Neuropathy 04/29/2021   Right hip pain 05/12/2021   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   Seasonal allergies    UTI (urinary tract infection) 05/12/2021   Vaginal enterocele 12/30/2017   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    Past Surgical History:  Procedure Laterality Date   ABDOMINAL HYSTERECTOMY  2019   APPENDECTOMY     yaken with first  c section    CESAREAN SECTION     x2    CONVERSION TO TOTAL HIP Right 03/20/2021   Procedure: Conversion of previous hemiarthroplasty  to right total hip arthroplasty;  Surgeon: Rod Can, MD;  Location: [facility];  Service: Orthopedics;  Laterality: Right;   CORONARY ARTERY BYPASS GRAFT  02/2015   DOUBLE BYPASS ; DONE AT  [place]   bilateral cataract extraction    JOINT REPLACEMENT Right    hip   KNEE ARTHROSCOPY Left    neck back  surgery     ROBOTIC ASSISTED TOTAL HYSTERECTOMY WITH BILATERAL SALPINGO OOPHERECTOMY Bilateral 06/15/2017   Procedure: XI ROBOTIC ASSISTED TOTAL HYSTERECTOMY WITH BILATERAL SALPINGO OOPHORECTOMY WITH SENTINAL LYMPH NODES, PELVIC LYMPHADENECTOMY, AND LYSIS OF ADHESIONS;  Surgeon: Everitt Amber, MD;  Location: [facility];  Service: Gynecology;  Laterality: Bilateral;   ROTATOR CUFF REPAIR Left    THYROID SURGERY     THYROIDECTOMY  2000s   VAGINAL PROLAPSE REPAIR      Current Medications: Current Meds  Medication Sig   ALPRAZolam (XANAX) 0.25 MG tablet Take 1 tablet (0.25 mg total) by mouth 3 (three) times daily as needed for up to 5 doses for anxiety.   aspirin EC 81 MG tablet Take 81 mg by mouth daily. Swallow whole.   atorvastatin (LIPITOR) 40 MG tablet Take 40 mg by mouth daily.   azelastine (OPTIVAR) 0.05 % ophthalmic solution Place 1 drop into both eyes daily as needed for dry eyes.   Calcium Carb-Cholecalciferol (CALCIUM 500 + D PO) Take 1 tablet by mouth daily.   cholecalciferol (VITAMIN D3) 25 MCG (1000 UNIT) tablet Take 2,000 Units by mouth daily.   ezetimibe (ZETIA) 10 MG tablet Take 10 mg by mouth daily.    Fiber POWD Take 1 Scoop by mouth daily.   furosemide (LASIX) 40 MG tablet Take 1 tablet (40 mg total) by mouth daily.   levothyroxine (SYNTHROID) 100 MCG tablet Take 100 mcg by mouth daily.   lisinopril (PRINIVIL,ZESTRIL) 10 MG tablet Take 10 mg by mouth at bedtime.   metoprolol succinate (TOPROL-XL) 50 MG 24 hr tablet Take 50 mg by mouth daily.   mirtazapine (REMERON) 15 MG tablet Take 7.5 mg by mouth daily.   Multiple Vitamin (MULTIVITAMIN WITH MINERALS) TABS tablet Take 1 tablet by mouth daily.   nitroGLYCERIN (NITROSTAT) 0.4 MG SL tablet Place 0.4 mg under the tongue every 5 (five) minutes as needed for chest pain.   omeprazole (PRILOSEC) 20 MG capsule Take 20 mg by mouth daily.     Allergies:   Patient has no known allergies.   Social History   Socioeconomic History    Marital status: Widowed    Spouse name: Not on file   Number of children: Not on file   Years of education: Not on file   Highest education level: Not on file  Occupational History   Not on file  Tobacco Use   Smoking status: Never   Smokeless tobacco: Never  Vaping Use   Vaping Use: Never used  Substance and Sexual Activity   Alcohol use: No   Drug use: No   Sexual activity: Not on file  Other Topics Concern   Not on file  Social History Narrative   Lives alone,retired.   Husband deceased   Social Determinants of Health   Financial Resource Strain: Not on file  Food Insecurity: No Food Insecurity (05/29/2021)   Hunger Vital Sign  Worried About Charity fundraiser in the Last Year: Never true    [place] in the Last Year: Never true  Transportation Needs: No Transportation Needs (05/29/2021)   PRAPARE - Hydrologist (Medical): No    Lack of Transportation (Non-Medical): No  Physical Activity: Not on file  Stress: Not on file  Social Connections: Not on file     Family History: The patient's family history includes Depression in her father; Heart failure in her mother; Kidney cancer in her brother; Leukemia in her brother.  ROS:   Please see the history of present illness.    All other systems reviewed and are negative.  EKGs/Labs/Other Studies Reviewed:    The following studies were reviewed today: I discussed my findings with the patient at length today   Recent Labs: 05/13/2021: ALT 14; BUN 31; Creatinine, Ser 1.24; Potassium 4.3; Sodium 134 05/16/2021: Hemoglobin 9.6; Platelets 218  Recent Lipid Panel    Component Value Date/Time   CHOL 136 03/11/2018 0808   TRIG 157 (H) 03/11/2018 0808   HDL 36 (L) 03/11/2018 0808   CHOLHDL 3.8 03/11/2018 0808   LDLCALC 69 03/11/2018 0808    Physical Exam:    VS:  BP 136/68   Pulse 76   Ht 5' (1.524 m)   Wt 126 lb 12.8 oz (57.5 kg)   SpO2 97%   BMI 24.76 kg/m     Wt Readings  from Last 3 Encounters:  08/12/21 126 lb 12.8 oz (57.5 kg)  03/20/21 135 lb (61.2 kg)  02/15/21 132 lb (59.9 kg)     GEN: Patient is in no acute distress HEENT: Normal NECK: No JVD; No carotid bruits LYMPHATICS: No lymphadenopathy CARDIAC: Hear sounds regular, 2/6 systolic murmur at the apex. RESPIRATORY:  Clear to auscultation without rales, wheezing or rhonchi  ABDOMEN: Soft, non-tender, non-distended MUSCULOSKELETAL:  No edema; No deformity  SKIN: Warm and dry NEUROLOGIC:  Alert and oriented x 3 PSYCHIATRIC:  Normal affect   Signed, Jenean Lindau, MD  08/12/2021 11:19 AM    [place]

## 2021-08-12 NOTE — Patient Instructions (Signed)

## 2021-08-15 DIAGNOSIS — E785 Hyperlipidemia, unspecified: Secondary | ICD-10-CM | POA: Diagnosis not present

## 2021-08-15 DIAGNOSIS — W19XXXD Unspecified fall, subsequent encounter: Secondary | ICD-10-CM | POA: Diagnosis not present

## 2021-08-15 DIAGNOSIS — I2510 Atherosclerotic heart disease of native coronary artery without angina pectoris: Secondary | ICD-10-CM | POA: Diagnosis not present

## 2021-08-15 DIAGNOSIS — S728X1D Other fracture of right femur, subsequent encounter for closed fracture with routine healing: Secondary | ICD-10-CM | POA: Diagnosis not present

## 2021-08-15 DIAGNOSIS — I10 Essential (primary) hypertension: Secondary | ICD-10-CM | POA: Diagnosis not present

## 2021-08-15 DIAGNOSIS — M9701XD Periprosthetic fracture around internal prosthetic right hip joint, subsequent encounter: Secondary | ICD-10-CM | POA: Diagnosis not present

## 2021-08-19 DIAGNOSIS — K769 Liver disease, unspecified: Secondary | ICD-10-CM | POA: Diagnosis not present

## 2021-08-19 DIAGNOSIS — I10 Essential (primary) hypertension: Secondary | ICD-10-CM | POA: Diagnosis not present

## 2021-08-19 DIAGNOSIS — S728X1D Other fracture of right femur, subsequent encounter for closed fracture with routine healing: Secondary | ICD-10-CM | POA: Diagnosis not present

## 2021-08-19 DIAGNOSIS — E890 Postprocedural hypothyroidism: Secondary | ICD-10-CM | POA: Diagnosis not present

## 2021-08-19 DIAGNOSIS — I352 Nonrheumatic aortic (valve) stenosis with insufficiency: Secondary | ICD-10-CM | POA: Diagnosis not present

## 2021-08-19 DIAGNOSIS — W19XXXD Unspecified fall, subsequent encounter: Secondary | ICD-10-CM | POA: Diagnosis not present

## 2021-08-19 DIAGNOSIS — I447 Left bundle-branch block, unspecified: Secondary | ICD-10-CM | POA: Diagnosis not present

## 2021-08-19 DIAGNOSIS — Z96641 Presence of right artificial hip joint: Secondary | ICD-10-CM | POA: Diagnosis not present

## 2021-08-19 DIAGNOSIS — Z7982 Long term (current) use of aspirin: Secondary | ICD-10-CM | POA: Diagnosis not present

## 2021-08-19 DIAGNOSIS — E785 Hyperlipidemia, unspecified: Secondary | ICD-10-CM | POA: Diagnosis not present

## 2021-08-19 DIAGNOSIS — Z9181 History of falling: Secondary | ICD-10-CM | POA: Diagnosis not present

## 2021-08-19 DIAGNOSIS — I2510 Atherosclerotic heart disease of native coronary artery without angina pectoris: Secondary | ICD-10-CM | POA: Diagnosis not present

## 2021-08-19 DIAGNOSIS — M9701XD Periprosthetic fracture around internal prosthetic right hip joint, subsequent encounter: Secondary | ICD-10-CM | POA: Diagnosis not present

## 2021-08-19 DIAGNOSIS — K219 Gastro-esophageal reflux disease without esophagitis: Secondary | ICD-10-CM | POA: Diagnosis not present

## 2021-08-20 DIAGNOSIS — I2510 Atherosclerotic heart disease of native coronary artery without angina pectoris: Secondary | ICD-10-CM | POA: Diagnosis not present

## 2021-08-20 DIAGNOSIS — M9701XD Periprosthetic fracture around internal prosthetic right hip joint, subsequent encounter: Secondary | ICD-10-CM | POA: Diagnosis not present

## 2021-08-20 DIAGNOSIS — I10 Essential (primary) hypertension: Secondary | ICD-10-CM | POA: Diagnosis not present

## 2021-08-20 DIAGNOSIS — E785 Hyperlipidemia, unspecified: Secondary | ICD-10-CM | POA: Diagnosis not present

## 2021-08-20 DIAGNOSIS — W19XXXD Unspecified fall, subsequent encounter: Secondary | ICD-10-CM | POA: Diagnosis not present

## 2021-08-20 DIAGNOSIS — S728X1D Other fracture of right femur, subsequent encounter for closed fracture with routine healing: Secondary | ICD-10-CM | POA: Diagnosis not present

## 2021-08-22 DIAGNOSIS — I2510 Atherosclerotic heart disease of native coronary artery without angina pectoris: Secondary | ICD-10-CM | POA: Diagnosis not present

## 2021-08-22 DIAGNOSIS — W19XXXD Unspecified fall, subsequent encounter: Secondary | ICD-10-CM | POA: Diagnosis not present

## 2021-08-22 DIAGNOSIS — M9701XD Periprosthetic fracture around internal prosthetic right hip joint, subsequent encounter: Secondary | ICD-10-CM | POA: Diagnosis not present

## 2021-08-22 DIAGNOSIS — E785 Hyperlipidemia, unspecified: Secondary | ICD-10-CM | POA: Diagnosis not present

## 2021-08-22 DIAGNOSIS — I10 Essential (primary) hypertension: Secondary | ICD-10-CM | POA: Diagnosis not present

## 2021-08-22 DIAGNOSIS — S728X1D Other fracture of right femur, subsequent encounter for closed fracture with routine healing: Secondary | ICD-10-CM | POA: Diagnosis not present

## 2021-08-23 DIAGNOSIS — M79671 Pain in right foot: Secondary | ICD-10-CM | POA: Diagnosis not present

## 2021-08-23 DIAGNOSIS — M5416 Radiculopathy, lumbar region: Secondary | ICD-10-CM

## 2021-08-23 DIAGNOSIS — M25571 Pain in right ankle and joints of right foot: Secondary | ICD-10-CM | POA: Diagnosis not present

## 2021-08-23 HISTORY — DX: Radiculopathy, lumbar region: M54.16

## 2021-08-26 DIAGNOSIS — S728X1D Other fracture of right femur, subsequent encounter for closed fracture with routine healing: Secondary | ICD-10-CM | POA: Diagnosis not present

## 2021-08-26 DIAGNOSIS — E785 Hyperlipidemia, unspecified: Secondary | ICD-10-CM | POA: Diagnosis not present

## 2021-08-26 DIAGNOSIS — W19XXXD Unspecified fall, subsequent encounter: Secondary | ICD-10-CM | POA: Diagnosis not present

## 2021-08-26 DIAGNOSIS — I2510 Atherosclerotic heart disease of native coronary artery without angina pectoris: Secondary | ICD-10-CM | POA: Diagnosis not present

## 2021-08-26 DIAGNOSIS — I10 Essential (primary) hypertension: Secondary | ICD-10-CM | POA: Diagnosis not present

## 2021-08-26 DIAGNOSIS — M9701XD Periprosthetic fracture around internal prosthetic right hip joint, subsequent encounter: Secondary | ICD-10-CM | POA: Diagnosis not present

## 2021-08-29 DIAGNOSIS — M9701XD Periprosthetic fracture around internal prosthetic right hip joint, subsequent encounter: Secondary | ICD-10-CM | POA: Diagnosis not present

## 2021-08-29 DIAGNOSIS — S728X1D Other fracture of right femur, subsequent encounter for closed fracture with routine healing: Secondary | ICD-10-CM | POA: Diagnosis not present

## 2021-08-29 DIAGNOSIS — W19XXXD Unspecified fall, subsequent encounter: Secondary | ICD-10-CM | POA: Diagnosis not present

## 2021-08-29 DIAGNOSIS — I2510 Atherosclerotic heart disease of native coronary artery without angina pectoris: Secondary | ICD-10-CM | POA: Diagnosis not present

## 2021-08-29 DIAGNOSIS — E785 Hyperlipidemia, unspecified: Secondary | ICD-10-CM | POA: Diagnosis not present

## 2021-08-29 DIAGNOSIS — I10 Essential (primary) hypertension: Secondary | ICD-10-CM | POA: Diagnosis not present

## 2021-08-30 DIAGNOSIS — E890 Postprocedural hypothyroidism: Secondary | ICD-10-CM | POA: Diagnosis not present

## 2021-09-03 DIAGNOSIS — S728X1D Other fracture of right femur, subsequent encounter for closed fracture with routine healing: Secondary | ICD-10-CM | POA: Diagnosis not present

## 2021-09-03 DIAGNOSIS — I2510 Atherosclerotic heart disease of native coronary artery without angina pectoris: Secondary | ICD-10-CM | POA: Diagnosis not present

## 2021-09-03 DIAGNOSIS — I10 Essential (primary) hypertension: Secondary | ICD-10-CM | POA: Diagnosis not present

## 2021-09-03 DIAGNOSIS — M9701XD Periprosthetic fracture around internal prosthetic right hip joint, subsequent encounter: Secondary | ICD-10-CM | POA: Diagnosis not present

## 2021-09-03 DIAGNOSIS — E785 Hyperlipidemia, unspecified: Secondary | ICD-10-CM | POA: Diagnosis not present

## 2021-09-03 DIAGNOSIS — W19XXXD Unspecified fall, subsequent encounter: Secondary | ICD-10-CM | POA: Diagnosis not present

## 2021-09-06 DIAGNOSIS — I10 Essential (primary) hypertension: Secondary | ICD-10-CM | POA: Diagnosis not present

## 2021-09-06 DIAGNOSIS — S728X1D Other fracture of right femur, subsequent encounter for closed fracture with routine healing: Secondary | ICD-10-CM | POA: Diagnosis not present

## 2021-09-06 DIAGNOSIS — W19XXXD Unspecified fall, subsequent encounter: Secondary | ICD-10-CM | POA: Diagnosis not present

## 2021-09-06 DIAGNOSIS — M9701XD Periprosthetic fracture around internal prosthetic right hip joint, subsequent encounter: Secondary | ICD-10-CM | POA: Diagnosis not present

## 2021-09-06 DIAGNOSIS — E785 Hyperlipidemia, unspecified: Secondary | ICD-10-CM | POA: Diagnosis not present

## 2021-09-06 DIAGNOSIS — I2510 Atherosclerotic heart disease of native coronary artery without angina pectoris: Secondary | ICD-10-CM | POA: Diagnosis not present

## 2021-09-09 DIAGNOSIS — S728X1D Other fracture of right femur, subsequent encounter for closed fracture with routine healing: Secondary | ICD-10-CM | POA: Diagnosis not present

## 2021-09-09 DIAGNOSIS — W19XXXD Unspecified fall, subsequent encounter: Secondary | ICD-10-CM | POA: Diagnosis not present

## 2021-09-09 DIAGNOSIS — M9701XD Periprosthetic fracture around internal prosthetic right hip joint, subsequent encounter: Secondary | ICD-10-CM | POA: Diagnosis not present

## 2021-09-09 DIAGNOSIS — I2510 Atherosclerotic heart disease of native coronary artery without angina pectoris: Secondary | ICD-10-CM | POA: Diagnosis not present

## 2021-09-09 DIAGNOSIS — E785 Hyperlipidemia, unspecified: Secondary | ICD-10-CM | POA: Diagnosis not present

## 2021-09-09 DIAGNOSIS — I10 Essential (primary) hypertension: Secondary | ICD-10-CM | POA: Diagnosis not present

## 2021-09-09 DIAGNOSIS — M5416 Radiculopathy, lumbar region: Secondary | ICD-10-CM | POA: Diagnosis not present

## 2021-09-13 DIAGNOSIS — M5416 Radiculopathy, lumbar region: Secondary | ICD-10-CM | POA: Diagnosis not present

## 2021-09-17 DIAGNOSIS — E785 Hyperlipidemia, unspecified: Secondary | ICD-10-CM | POA: Diagnosis not present

## 2021-09-17 DIAGNOSIS — I10 Essential (primary) hypertension: Secondary | ICD-10-CM | POA: Diagnosis not present

## 2021-09-17 DIAGNOSIS — S728X1D Other fracture of right femur, subsequent encounter for closed fracture with routine healing: Secondary | ICD-10-CM | POA: Diagnosis not present

## 2021-09-17 DIAGNOSIS — M9701XD Periprosthetic fracture around internal prosthetic right hip joint, subsequent encounter: Secondary | ICD-10-CM | POA: Diagnosis not present

## 2021-09-17 DIAGNOSIS — I2510 Atherosclerotic heart disease of native coronary artery without angina pectoris: Secondary | ICD-10-CM | POA: Diagnosis not present

## 2021-09-17 DIAGNOSIS — W19XXXD Unspecified fall, subsequent encounter: Secondary | ICD-10-CM | POA: Diagnosis not present

## 2021-09-18 DIAGNOSIS — K769 Liver disease, unspecified: Secondary | ICD-10-CM | POA: Diagnosis not present

## 2021-09-18 DIAGNOSIS — S728X1D Other fracture of right femur, subsequent encounter for closed fracture with routine healing: Secondary | ICD-10-CM | POA: Diagnosis not present

## 2021-09-18 DIAGNOSIS — Z96641 Presence of right artificial hip joint: Secondary | ICD-10-CM | POA: Diagnosis not present

## 2021-09-18 DIAGNOSIS — K219 Gastro-esophageal reflux disease without esophagitis: Secondary | ICD-10-CM | POA: Diagnosis not present

## 2021-09-18 DIAGNOSIS — E890 Postprocedural hypothyroidism: Secondary | ICD-10-CM | POA: Diagnosis not present

## 2021-09-18 DIAGNOSIS — Z7982 Long term (current) use of aspirin: Secondary | ICD-10-CM | POA: Diagnosis not present

## 2021-09-18 DIAGNOSIS — I2510 Atherosclerotic heart disease of native coronary artery without angina pectoris: Secondary | ICD-10-CM | POA: Diagnosis not present

## 2021-09-18 DIAGNOSIS — Z9181 History of falling: Secondary | ICD-10-CM | POA: Diagnosis not present

## 2021-09-18 DIAGNOSIS — E785 Hyperlipidemia, unspecified: Secondary | ICD-10-CM | POA: Diagnosis not present

## 2021-09-18 DIAGNOSIS — I447 Left bundle-branch block, unspecified: Secondary | ICD-10-CM | POA: Diagnosis not present

## 2021-09-18 DIAGNOSIS — W19XXXD Unspecified fall, subsequent encounter: Secondary | ICD-10-CM | POA: Diagnosis not present

## 2021-09-18 DIAGNOSIS — I352 Nonrheumatic aortic (valve) stenosis with insufficiency: Secondary | ICD-10-CM | POA: Diagnosis not present

## 2021-09-18 DIAGNOSIS — M9701XD Periprosthetic fracture around internal prosthetic right hip joint, subsequent encounter: Secondary | ICD-10-CM | POA: Diagnosis not present

## 2021-09-18 DIAGNOSIS — I10 Essential (primary) hypertension: Secondary | ICD-10-CM | POA: Diagnosis not present

## 2021-09-24 DIAGNOSIS — I10 Essential (primary) hypertension: Secondary | ICD-10-CM | POA: Diagnosis not present

## 2021-09-24 DIAGNOSIS — M9701XD Periprosthetic fracture around internal prosthetic right hip joint, subsequent encounter: Secondary | ICD-10-CM | POA: Diagnosis not present

## 2021-09-24 DIAGNOSIS — I2510 Atherosclerotic heart disease of native coronary artery without angina pectoris: Secondary | ICD-10-CM | POA: Diagnosis not present

## 2021-09-24 DIAGNOSIS — S728X1D Other fracture of right femur, subsequent encounter for closed fracture with routine healing: Secondary | ICD-10-CM | POA: Diagnosis not present

## 2021-09-24 DIAGNOSIS — W19XXXD Unspecified fall, subsequent encounter: Secondary | ICD-10-CM | POA: Diagnosis not present

## 2021-09-24 DIAGNOSIS — E785 Hyperlipidemia, unspecified: Secondary | ICD-10-CM | POA: Diagnosis not present

## 2021-09-25 DIAGNOSIS — M79672 Pain in left foot: Secondary | ICD-10-CM | POA: Diagnosis not present

## 2021-09-25 DIAGNOSIS — M5416 Radiculopathy, lumbar region: Secondary | ICD-10-CM | POA: Diagnosis not present

## 2021-09-27 ENCOUNTER — Encounter: Payer: Self-pay | Admitting: Infectious Diseases

## 2021-09-27 DIAGNOSIS — E559 Vitamin D deficiency, unspecified: Secondary | ICD-10-CM | POA: Diagnosis not present

## 2021-09-27 DIAGNOSIS — I2510 Atherosclerotic heart disease of native coronary artery without angina pectoris: Secondary | ICD-10-CM | POA: Diagnosis not present

## 2021-09-27 DIAGNOSIS — E785 Hyperlipidemia, unspecified: Secondary | ICD-10-CM | POA: Diagnosis not present

## 2021-09-27 DIAGNOSIS — D62 Acute posthemorrhagic anemia: Secondary | ICD-10-CM | POA: Diagnosis not present

## 2021-09-27 DIAGNOSIS — I10 Essential (primary) hypertension: Secondary | ICD-10-CM | POA: Diagnosis not present

## 2021-09-27 DIAGNOSIS — E890 Postprocedural hypothyroidism: Secondary | ICD-10-CM | POA: Diagnosis not present

## 2021-10-01 DIAGNOSIS — I10 Essential (primary) hypertension: Secondary | ICD-10-CM | POA: Diagnosis not present

## 2021-10-01 DIAGNOSIS — W19XXXD Unspecified fall, subsequent encounter: Secondary | ICD-10-CM | POA: Diagnosis not present

## 2021-10-01 DIAGNOSIS — M9701XD Periprosthetic fracture around internal prosthetic right hip joint, subsequent encounter: Secondary | ICD-10-CM | POA: Diagnosis not present

## 2021-10-01 DIAGNOSIS — S728X1D Other fracture of right femur, subsequent encounter for closed fracture with routine healing: Secondary | ICD-10-CM | POA: Diagnosis not present

## 2021-10-01 DIAGNOSIS — E785 Hyperlipidemia, unspecified: Secondary | ICD-10-CM | POA: Diagnosis not present

## 2021-10-01 DIAGNOSIS — I2510 Atherosclerotic heart disease of native coronary artery without angina pectoris: Secondary | ICD-10-CM | POA: Diagnosis not present

## 2021-10-03 DIAGNOSIS — Z1331 Encounter for screening for depression: Secondary | ICD-10-CM | POA: Diagnosis not present

## 2021-10-03 DIAGNOSIS — Z0000 Encounter for general adult medical examination without abnormal findings: Secondary | ICD-10-CM | POA: Diagnosis not present

## 2021-10-03 DIAGNOSIS — E785 Hyperlipidemia, unspecified: Secondary | ICD-10-CM | POA: Diagnosis not present

## 2021-10-03 DIAGNOSIS — Z9181 History of falling: Secondary | ICD-10-CM | POA: Diagnosis not present

## 2021-10-09 DIAGNOSIS — I2510 Atherosclerotic heart disease of native coronary artery without angina pectoris: Secondary | ICD-10-CM | POA: Diagnosis not present

## 2021-10-09 DIAGNOSIS — S728X1D Other fracture of right femur, subsequent encounter for closed fracture with routine healing: Secondary | ICD-10-CM | POA: Diagnosis not present

## 2021-10-09 DIAGNOSIS — E785 Hyperlipidemia, unspecified: Secondary | ICD-10-CM | POA: Diagnosis not present

## 2021-10-09 DIAGNOSIS — W19XXXD Unspecified fall, subsequent encounter: Secondary | ICD-10-CM | POA: Diagnosis not present

## 2021-10-09 DIAGNOSIS — M9701XD Periprosthetic fracture around internal prosthetic right hip joint, subsequent encounter: Secondary | ICD-10-CM | POA: Diagnosis not present

## 2021-10-09 DIAGNOSIS — I10 Essential (primary) hypertension: Secondary | ICD-10-CM | POA: Diagnosis not present

## 2021-10-15 DIAGNOSIS — S728X1D Other fracture of right femur, subsequent encounter for closed fracture with routine healing: Secondary | ICD-10-CM | POA: Diagnosis not present

## 2021-10-15 DIAGNOSIS — W19XXXD Unspecified fall, subsequent encounter: Secondary | ICD-10-CM | POA: Diagnosis not present

## 2021-10-15 DIAGNOSIS — E785 Hyperlipidemia, unspecified: Secondary | ICD-10-CM | POA: Diagnosis not present

## 2021-10-15 DIAGNOSIS — M9701XD Periprosthetic fracture around internal prosthetic right hip joint, subsequent encounter: Secondary | ICD-10-CM | POA: Diagnosis not present

## 2021-10-15 DIAGNOSIS — I10 Essential (primary) hypertension: Secondary | ICD-10-CM | POA: Diagnosis not present

## 2021-10-15 DIAGNOSIS — I2510 Atherosclerotic heart disease of native coronary artery without angina pectoris: Secondary | ICD-10-CM | POA: Diagnosis not present

## 2021-10-18 DIAGNOSIS — I447 Left bundle-branch block, unspecified: Secondary | ICD-10-CM | POA: Diagnosis not present

## 2021-10-18 DIAGNOSIS — Z7982 Long term (current) use of aspirin: Secondary | ICD-10-CM | POA: Diagnosis not present

## 2021-10-18 DIAGNOSIS — Z96641 Presence of right artificial hip joint: Secondary | ICD-10-CM | POA: Diagnosis not present

## 2021-10-18 DIAGNOSIS — M9701XD Periprosthetic fracture around internal prosthetic right hip joint, subsequent encounter: Secondary | ICD-10-CM | POA: Diagnosis not present

## 2021-10-18 DIAGNOSIS — E890 Postprocedural hypothyroidism: Secondary | ICD-10-CM | POA: Diagnosis not present

## 2021-10-18 DIAGNOSIS — K219 Gastro-esophageal reflux disease without esophagitis: Secondary | ICD-10-CM | POA: Diagnosis not present

## 2021-10-18 DIAGNOSIS — I352 Nonrheumatic aortic (valve) stenosis with insufficiency: Secondary | ICD-10-CM | POA: Diagnosis not present

## 2021-10-18 DIAGNOSIS — Z9181 History of falling: Secondary | ICD-10-CM | POA: Diagnosis not present

## 2021-10-18 DIAGNOSIS — K769 Liver disease, unspecified: Secondary | ICD-10-CM | POA: Diagnosis not present

## 2021-10-18 DIAGNOSIS — I2510 Atherosclerotic heart disease of native coronary artery without angina pectoris: Secondary | ICD-10-CM | POA: Diagnosis not present

## 2021-10-18 DIAGNOSIS — W19XXXD Unspecified fall, subsequent encounter: Secondary | ICD-10-CM | POA: Diagnosis not present

## 2021-10-18 DIAGNOSIS — S728X1D Other fracture of right femur, subsequent encounter for closed fracture with routine healing: Secondary | ICD-10-CM | POA: Diagnosis not present

## 2021-10-18 DIAGNOSIS — E785 Hyperlipidemia, unspecified: Secondary | ICD-10-CM | POA: Diagnosis not present

## 2021-10-18 DIAGNOSIS — I10 Essential (primary) hypertension: Secondary | ICD-10-CM | POA: Diagnosis not present

## 2021-10-22 DIAGNOSIS — I10 Essential (primary) hypertension: Secondary | ICD-10-CM | POA: Diagnosis not present

## 2021-10-22 DIAGNOSIS — E785 Hyperlipidemia, unspecified: Secondary | ICD-10-CM | POA: Diagnosis not present

## 2021-10-22 DIAGNOSIS — M9701XD Periprosthetic fracture around internal prosthetic right hip joint, subsequent encounter: Secondary | ICD-10-CM | POA: Diagnosis not present

## 2021-10-22 DIAGNOSIS — W19XXXD Unspecified fall, subsequent encounter: Secondary | ICD-10-CM | POA: Diagnosis not present

## 2021-10-22 DIAGNOSIS — I2510 Atherosclerotic heart disease of native coronary artery without angina pectoris: Secondary | ICD-10-CM | POA: Diagnosis not present

## 2021-10-22 DIAGNOSIS — S728X1D Other fracture of right femur, subsequent encounter for closed fracture with routine healing: Secondary | ICD-10-CM | POA: Diagnosis not present

## 2021-10-29 DIAGNOSIS — Z96641 Presence of right artificial hip joint: Secondary | ICD-10-CM | POA: Diagnosis not present

## 2021-10-29 DIAGNOSIS — Z471 Aftercare following joint replacement surgery: Secondary | ICD-10-CM | POA: Diagnosis not present

## 2021-10-30 DIAGNOSIS — M9701XD Periprosthetic fracture around internal prosthetic right hip joint, subsequent encounter: Secondary | ICD-10-CM | POA: Diagnosis not present

## 2021-10-30 DIAGNOSIS — I10 Essential (primary) hypertension: Secondary | ICD-10-CM | POA: Diagnosis not present

## 2021-10-30 DIAGNOSIS — E785 Hyperlipidemia, unspecified: Secondary | ICD-10-CM | POA: Diagnosis not present

## 2021-10-30 DIAGNOSIS — I2510 Atherosclerotic heart disease of native coronary artery without angina pectoris: Secondary | ICD-10-CM | POA: Diagnosis not present

## 2021-10-30 DIAGNOSIS — S728X1D Other fracture of right femur, subsequent encounter for closed fracture with routine healing: Secondary | ICD-10-CM | POA: Diagnosis not present

## 2021-10-30 DIAGNOSIS — W19XXXD Unspecified fall, subsequent encounter: Secondary | ICD-10-CM | POA: Diagnosis not present

## 2021-11-05 DIAGNOSIS — E785 Hyperlipidemia, unspecified: Secondary | ICD-10-CM | POA: Diagnosis not present

## 2021-11-05 DIAGNOSIS — S728X1D Other fracture of right femur, subsequent encounter for closed fracture with routine healing: Secondary | ICD-10-CM | POA: Diagnosis not present

## 2021-11-05 DIAGNOSIS — I2510 Atherosclerotic heart disease of native coronary artery without angina pectoris: Secondary | ICD-10-CM | POA: Diagnosis not present

## 2021-11-05 DIAGNOSIS — W19XXXD Unspecified fall, subsequent encounter: Secondary | ICD-10-CM | POA: Diagnosis not present

## 2021-11-05 DIAGNOSIS — I10 Essential (primary) hypertension: Secondary | ICD-10-CM | POA: Diagnosis not present

## 2021-11-05 DIAGNOSIS — M9701XD Periprosthetic fracture around internal prosthetic right hip joint, subsequent encounter: Secondary | ICD-10-CM | POA: Diagnosis not present

## 2021-11-11 DIAGNOSIS — W19XXXD Unspecified fall, subsequent encounter: Secondary | ICD-10-CM | POA: Diagnosis not present

## 2021-11-11 DIAGNOSIS — S728X1D Other fracture of right femur, subsequent encounter for closed fracture with routine healing: Secondary | ICD-10-CM | POA: Diagnosis not present

## 2021-11-11 DIAGNOSIS — I10 Essential (primary) hypertension: Secondary | ICD-10-CM | POA: Diagnosis not present

## 2021-11-11 DIAGNOSIS — E785 Hyperlipidemia, unspecified: Secondary | ICD-10-CM | POA: Diagnosis not present

## 2021-11-11 DIAGNOSIS — I2510 Atherosclerotic heart disease of native coronary artery without angina pectoris: Secondary | ICD-10-CM | POA: Diagnosis not present

## 2021-11-11 DIAGNOSIS — M9701XD Periprosthetic fracture around internal prosthetic right hip joint, subsequent encounter: Secondary | ICD-10-CM | POA: Diagnosis not present

## 2021-11-12 ENCOUNTER — Ambulatory Visit: Payer: Medicare Other | Admitting: Podiatry

## 2021-11-14 DIAGNOSIS — H5213 Myopia, bilateral: Secondary | ICD-10-CM | POA: Diagnosis not present

## 2021-11-14 DIAGNOSIS — Z961 Presence of intraocular lens: Secondary | ICD-10-CM | POA: Diagnosis not present

## 2021-11-14 DIAGNOSIS — H524 Presbyopia: Secondary | ICD-10-CM | POA: Diagnosis not present

## 2021-11-14 DIAGNOSIS — H04123 Dry eye syndrome of bilateral lacrimal glands: Secondary | ICD-10-CM | POA: Diagnosis not present

## 2021-11-14 DIAGNOSIS — H353131 Nonexudative age-related macular degeneration, bilateral, early dry stage: Secondary | ICD-10-CM | POA: Diagnosis not present

## 2021-11-17 DIAGNOSIS — I10 Essential (primary) hypertension: Secondary | ICD-10-CM | POA: Diagnosis not present

## 2021-11-17 DIAGNOSIS — K769 Liver disease, unspecified: Secondary | ICD-10-CM | POA: Diagnosis not present

## 2021-11-17 DIAGNOSIS — M9701XD Periprosthetic fracture around internal prosthetic right hip joint, subsequent encounter: Secondary | ICD-10-CM | POA: Diagnosis not present

## 2021-11-17 DIAGNOSIS — Z96641 Presence of right artificial hip joint: Secondary | ICD-10-CM | POA: Diagnosis not present

## 2021-11-17 DIAGNOSIS — S728X1D Other fracture of right femur, subsequent encounter for closed fracture with routine healing: Secondary | ICD-10-CM | POA: Diagnosis not present

## 2021-11-17 DIAGNOSIS — I2510 Atherosclerotic heart disease of native coronary artery without angina pectoris: Secondary | ICD-10-CM | POA: Diagnosis not present

## 2021-11-17 DIAGNOSIS — W19XXXD Unspecified fall, subsequent encounter: Secondary | ICD-10-CM | POA: Diagnosis not present

## 2021-11-17 DIAGNOSIS — E890 Postprocedural hypothyroidism: Secondary | ICD-10-CM | POA: Diagnosis not present

## 2021-11-17 DIAGNOSIS — I447 Left bundle-branch block, unspecified: Secondary | ICD-10-CM | POA: Diagnosis not present

## 2021-11-17 DIAGNOSIS — E785 Hyperlipidemia, unspecified: Secondary | ICD-10-CM | POA: Diagnosis not present

## 2021-11-17 DIAGNOSIS — I352 Nonrheumatic aortic (valve) stenosis with insufficiency: Secondary | ICD-10-CM | POA: Diagnosis not present

## 2021-11-17 DIAGNOSIS — K219 Gastro-esophageal reflux disease without esophagitis: Secondary | ICD-10-CM | POA: Diagnosis not present

## 2021-11-17 DIAGNOSIS — Z9181 History of falling: Secondary | ICD-10-CM | POA: Diagnosis not present

## 2021-11-17 DIAGNOSIS — Z7982 Long term (current) use of aspirin: Secondary | ICD-10-CM | POA: Diagnosis not present

## 2021-11-19 ENCOUNTER — Ambulatory Visit (INDEPENDENT_AMBULATORY_CARE_PROVIDER_SITE_OTHER): Payer: Medicare Other | Admitting: Podiatry

## 2021-11-19 DIAGNOSIS — M9701XD Periprosthetic fracture around internal prosthetic right hip joint, subsequent encounter: Secondary | ICD-10-CM | POA: Diagnosis not present

## 2021-11-19 DIAGNOSIS — W19XXXD Unspecified fall, subsequent encounter: Secondary | ICD-10-CM | POA: Diagnosis not present

## 2021-11-19 DIAGNOSIS — S728X1D Other fracture of right femur, subsequent encounter for closed fracture with routine healing: Secondary | ICD-10-CM | POA: Diagnosis not present

## 2021-11-19 DIAGNOSIS — M2042 Other hammer toe(s) (acquired), left foot: Secondary | ICD-10-CM

## 2021-11-19 DIAGNOSIS — I10 Essential (primary) hypertension: Secondary | ICD-10-CM | POA: Diagnosis not present

## 2021-11-19 DIAGNOSIS — I2510 Atherosclerotic heart disease of native coronary artery without angina pectoris: Secondary | ICD-10-CM | POA: Diagnosis not present

## 2021-11-19 DIAGNOSIS — L84 Corns and callosities: Secondary | ICD-10-CM

## 2021-11-19 DIAGNOSIS — E785 Hyperlipidemia, unspecified: Secondary | ICD-10-CM | POA: Diagnosis not present

## 2021-11-19 NOTE — Progress Notes (Unsigned)
  Subjective:  Patient ID: Brittany Santiago, female    DOB: [birth year],  MRN: [id number]  Chief Complaint  Patient presents with   Foot Problem    corn between toes on left foot causing discomfort    88 y.o. female presents with the above complaint. History confirmed with patient. Patient presents for painful lesion present in between 1st and 2nd toe on the left foot. Has a corn on the 2nd toe that is causing pain where it presses into the left great toe. Has been present for a long time. Tries to shave it down herself but not effective at reducing her pain.   Objective:  Physical Exam: warm, good capillary refill, nail exam normal nails without lesions, Attention to the medial aspect of the 2nd toe PIPJ there is a hyperkeratotic lesion present that is tender with palpation, superficial breakdown of skin underlying the hard callus upon debridement. DP pulses palpable, PT pulses palpable, and protective sensation intact Left Foot: normal exam, no swelling, tenderness, instability; ligaments intact, full range of motion of all ankle/foot joints and Hammertoe deformity of the left 2nd toe rigid, HAV deformity left foot   Right Foot: normal exam, no swelling, tenderness, instability; ligaments intact, full range of motion of all ankle/foot joints   No images are attached to the encounter.  Assessment:   1. Hammertoe of left foot   2. Pre-ulcerative calluses   3. Corn of toe      Plan:  Patient was evaluated and treated and all questions answered.  #painful hyperkeratotic callus located at the medial aspect of the 2nd toe PIPJ All symptomatic hyperkeratoses were safely debrided with a sterile #15 blade to patient's level of comfort without incident. We discussed preventative and palliative care of these lesions including supportive and accommodative shoegear, padding, prefabricated and custom molded accommodative orthoses, use of a pumice stone and lotions/creams daily.   Return in about 10  weeks (around 01/28/2022) for Callus and RFC.         Everitt Amber, DPM [facility]

## 2021-12-03 DIAGNOSIS — I10 Essential (primary) hypertension: Secondary | ICD-10-CM | POA: Diagnosis not present

## 2021-12-03 DIAGNOSIS — I2510 Atherosclerotic heart disease of native coronary artery without angina pectoris: Secondary | ICD-10-CM | POA: Diagnosis not present

## 2021-12-03 DIAGNOSIS — S728X1D Other fracture of right femur, subsequent encounter for closed fracture with routine healing: Secondary | ICD-10-CM | POA: Diagnosis not present

## 2021-12-03 DIAGNOSIS — M9701XD Periprosthetic fracture around internal prosthetic right hip joint, subsequent encounter: Secondary | ICD-10-CM | POA: Diagnosis not present

## 2021-12-03 DIAGNOSIS — W19XXXD Unspecified fall, subsequent encounter: Secondary | ICD-10-CM | POA: Diagnosis not present

## 2021-12-03 DIAGNOSIS — E785 Hyperlipidemia, unspecified: Secondary | ICD-10-CM | POA: Diagnosis not present

## 2021-12-10 DIAGNOSIS — I2510 Atherosclerotic heart disease of native coronary artery without angina pectoris: Secondary | ICD-10-CM | POA: Diagnosis not present

## 2021-12-10 DIAGNOSIS — M9701XD Periprosthetic fracture around internal prosthetic right hip joint, subsequent encounter: Secondary | ICD-10-CM | POA: Diagnosis not present

## 2021-12-10 DIAGNOSIS — I10 Essential (primary) hypertension: Secondary | ICD-10-CM | POA: Diagnosis not present

## 2021-12-10 DIAGNOSIS — E785 Hyperlipidemia, unspecified: Secondary | ICD-10-CM | POA: Diagnosis not present

## 2021-12-10 DIAGNOSIS — W19XXXD Unspecified fall, subsequent encounter: Secondary | ICD-10-CM | POA: Diagnosis not present

## 2021-12-10 DIAGNOSIS — S728X1D Other fracture of right femur, subsequent encounter for closed fracture with routine healing: Secondary | ICD-10-CM | POA: Diagnosis not present

## 2021-12-19 DIAGNOSIS — D485 Neoplasm of uncertain behavior of skin: Secondary | ICD-10-CM | POA: Diagnosis not present

## 2021-12-19 DIAGNOSIS — L821 Other seborrheic keratosis: Secondary | ICD-10-CM | POA: Diagnosis not present

## 2021-12-19 DIAGNOSIS — L578 Other skin changes due to chronic exposure to nonionizing radiation: Secondary | ICD-10-CM | POA: Diagnosis not present

## 2021-12-19 DIAGNOSIS — L820 Inflamed seborrheic keratosis: Secondary | ICD-10-CM | POA: Diagnosis not present

## 2021-12-31 DIAGNOSIS — D62 Acute posthemorrhagic anemia: Secondary | ICD-10-CM | POA: Diagnosis not present

## 2021-12-31 DIAGNOSIS — Z23 Encounter for immunization: Secondary | ICD-10-CM | POA: Diagnosis not present

## 2021-12-31 DIAGNOSIS — I10 Essential (primary) hypertension: Secondary | ICD-10-CM | POA: Diagnosis not present

## 2021-12-31 DIAGNOSIS — E890 Postprocedural hypothyroidism: Secondary | ICD-10-CM | POA: Diagnosis not present

## 2021-12-31 DIAGNOSIS — E559 Vitamin D deficiency, unspecified: Secondary | ICD-10-CM | POA: Diagnosis not present

## 2021-12-31 DIAGNOSIS — E785 Hyperlipidemia, unspecified: Secondary | ICD-10-CM | POA: Diagnosis not present

## 2021-12-31 DIAGNOSIS — I2510 Atherosclerotic heart disease of native coronary artery without angina pectoris: Secondary | ICD-10-CM | POA: Diagnosis not present

## 2022-01-23 DIAGNOSIS — E785 Hyperlipidemia, unspecified: Secondary | ICD-10-CM | POA: Diagnosis not present

## 2022-01-23 DIAGNOSIS — K219 Gastro-esophageal reflux disease without esophagitis: Secondary | ICD-10-CM | POA: Diagnosis not present

## 2022-01-23 DIAGNOSIS — M1990 Unspecified osteoarthritis, unspecified site: Secondary | ICD-10-CM | POA: Diagnosis not present

## 2022-01-23 DIAGNOSIS — I10 Essential (primary) hypertension: Secondary | ICD-10-CM | POA: Diagnosis not present

## 2022-01-28 ENCOUNTER — Ambulatory Visit: Payer: Medicare Other | Admitting: Podiatry

## 2022-02-03 DIAGNOSIS — E890 Postprocedural hypothyroidism: Secondary | ICD-10-CM | POA: Diagnosis not present

## 2022-02-10 ENCOUNTER — Telehealth: Payer: Self-pay

## 2022-02-10 NOTE — Patient Outreach (Signed)
  Care Coordination   Initial Visit Note   02/10/2022 Name: RASHEA HOSKIE MRN: [id number] DOB: 04-Apr-1930  ADISA VIGEANT is a 88 y.o. year old female who sees Nicoletta Dress, MD for primary care. I spoke with  Rebeca Alert by phone today.  What matters to the patients health and wellness today?  Placed call to patient to review and offer [facility] care coordination program. Patient reports that she is doing well.  Denies any needs today.    SDOH assessments and interventions completed:  No     Care Coordination Interventions:  No, not indicated   Follow up plan: No further intervention required.   Encounter Outcome:  Pt. Refused   Tomasa Rand, RN, BSN, CEN [facility] [employer] [phone]

## 2022-03-10 DIAGNOSIS — Z96641 Presence of right artificial hip joint: Secondary | ICD-10-CM | POA: Diagnosis not present

## 2022-03-31 DIAGNOSIS — M1712 Unilateral primary osteoarthritis, left knee: Secondary | ICD-10-CM | POA: Diagnosis not present

## 2022-03-31 DIAGNOSIS — M25462 Effusion, left knee: Secondary | ICD-10-CM | POA: Diagnosis not present

## 2022-03-31 DIAGNOSIS — M25562 Pain in left knee: Secondary | ICD-10-CM | POA: Diagnosis not present

## 2022-04-03 DIAGNOSIS — E785 Hyperlipidemia, unspecified: Secondary | ICD-10-CM | POA: Diagnosis not present

## 2022-04-03 DIAGNOSIS — E890 Postprocedural hypothyroidism: Secondary | ICD-10-CM | POA: Diagnosis not present

## 2022-04-03 DIAGNOSIS — I2510 Atherosclerotic heart disease of native coronary artery without angina pectoris: Secondary | ICD-10-CM | POA: Diagnosis not present

## 2022-04-03 DIAGNOSIS — M1712 Unilateral primary osteoarthritis, left knee: Secondary | ICD-10-CM | POA: Diagnosis not present

## 2022-04-03 DIAGNOSIS — D62 Acute posthemorrhagic anemia: Secondary | ICD-10-CM | POA: Diagnosis not present

## 2022-04-03 DIAGNOSIS — E559 Vitamin D deficiency, unspecified: Secondary | ICD-10-CM | POA: Diagnosis not present

## 2022-04-03 DIAGNOSIS — I10 Essential (primary) hypertension: Secondary | ICD-10-CM | POA: Diagnosis not present

## 2022-04-15 DIAGNOSIS — M1712 Unilateral primary osteoarthritis, left knee: Secondary | ICD-10-CM | POA: Diagnosis not present

## 2022-07-07 DIAGNOSIS — E559 Vitamin D deficiency, unspecified: Secondary | ICD-10-CM | POA: Diagnosis not present

## 2022-07-07 DIAGNOSIS — E785 Hyperlipidemia, unspecified: Secondary | ICD-10-CM | POA: Diagnosis not present

## 2022-07-07 DIAGNOSIS — E890 Postprocedural hypothyroidism: Secondary | ICD-10-CM | POA: Diagnosis not present

## 2022-07-07 DIAGNOSIS — I10 Essential (primary) hypertension: Secondary | ICD-10-CM | POA: Diagnosis not present

## 2022-07-07 DIAGNOSIS — I2510 Atherosclerotic heart disease of native coronary artery without angina pectoris: Secondary | ICD-10-CM | POA: Diagnosis not present

## 2022-07-07 DIAGNOSIS — D62 Acute posthemorrhagic anemia: Secondary | ICD-10-CM | POA: Diagnosis not present

## 2022-08-03 IMAGING — RF DG HIP (WITH PELVIS) OPERATIVE*R*
1 series · 2 of 2 positions shown · non-contrast
Comparison: December 18, 2020.

CLINICAL DATA: Convergent 2 right total hip arthroplasty.

EXAM:
OPERATIVE right HIP (WITH PELVIS IF PERFORMED) 2 VIEWS
TECHNIQUE: Fluoroscopic spot image(s) were submitted for interpretation
post-operatively.
Radiation exposure index: 3.9406 mGy.

[Series 1: unknown protocol · 0.20mm/px · 2 of 2 slices shown]
[im 1/2]
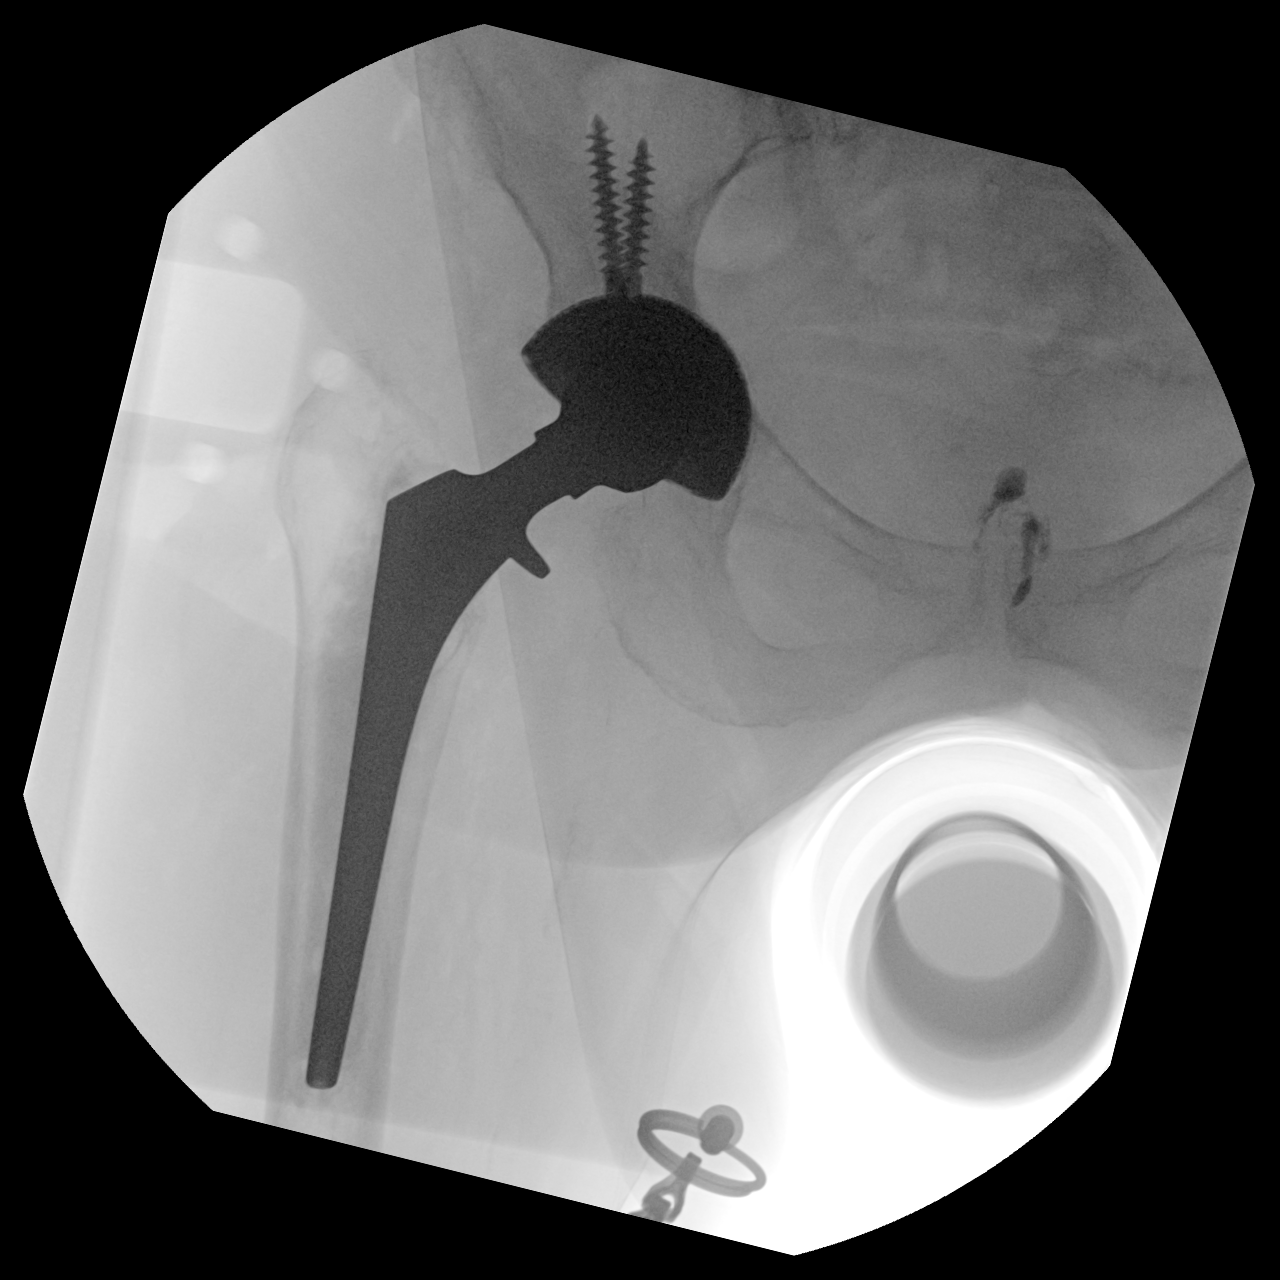
[im 2/2]
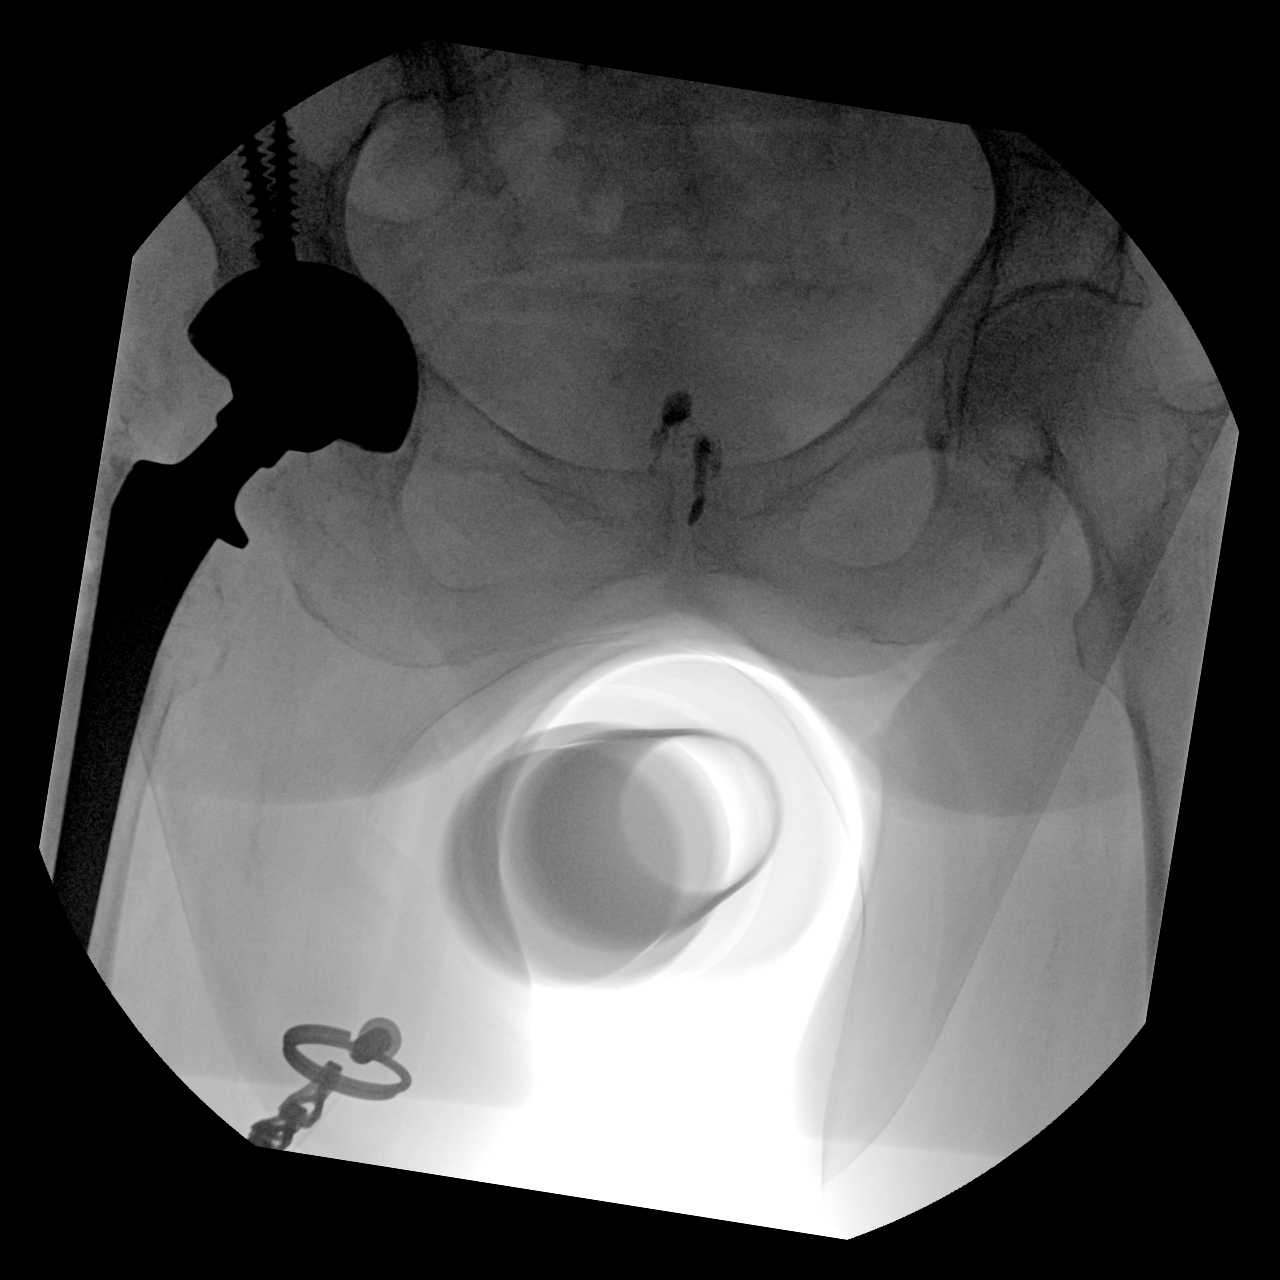

[2 of 2 positions shown; findings below may reference images not displayed]

FINDINGS: Two intraoperative fluoroscopic images were obtained of the right
hip. Right femoral and acetabular components appear to be well
situated.
IMPRESSION: Fluoroscopic guidance provided during right total hip arthroplasty.

## 2022-08-06 ENCOUNTER — Other Ambulatory Visit: Payer: Self-pay | Admitting: Cardiology

## 2022-08-08 ENCOUNTER — Other Ambulatory Visit: Payer: Self-pay

## 2022-08-12 DIAGNOSIS — M25562 Pain in left knee: Secondary | ICD-10-CM | POA: Diagnosis not present

## 2022-08-12 DIAGNOSIS — M1712 Unilateral primary osteoarthritis, left knee: Secondary | ICD-10-CM | POA: Diagnosis not present

## 2022-08-25 ENCOUNTER — Ambulatory Visit: Payer: Medicare Other | Attending: Cardiology | Admitting: Cardiology

## 2022-08-25 ENCOUNTER — Encounter: Payer: Self-pay | Admitting: Cardiology

## 2022-08-25 VITALS — BP 130/60 | HR 73 | Ht 60.0 in | Wt 129.0 lb

## 2022-08-25 DIAGNOSIS — E785 Hyperlipidemia, unspecified: Secondary | ICD-10-CM

## 2022-08-25 DIAGNOSIS — I351 Nonrheumatic aortic (valve) insufficiency: Secondary | ICD-10-CM

## 2022-08-25 DIAGNOSIS — Z951 Presence of aortocoronary bypass graft: Secondary | ICD-10-CM

## 2022-08-25 DIAGNOSIS — I350 Nonrheumatic aortic (valve) stenosis: Secondary | ICD-10-CM

## 2022-08-25 DIAGNOSIS — I2510 Atherosclerotic heart disease of native coronary artery without angina pectoris: Secondary | ICD-10-CM

## 2022-08-25 NOTE — Progress Notes (Signed)
Cardiology Office Note:    Date:  08/25/2022   ID:  ZONDRA BOLSTAD, DOB 1930/09/14, MRN [id number]  PCP:  Paulina Fusi, MD  Cardiologist:  Garwin Brothers, MD   Referring MD: Paulina Fusi, MD    ASSESSMENT:    1. Atherosclerosis of native coronary artery of native heart without angina pectoris   2. Mild aortic valve regurgitation   3. Mild aortic valve stenosis   4. Dyslipidemia   5. S/P CABG x 2    PLAN:    In order of problems listed above:  Coronary artery disease: Secondary prevention stressed with the patient.  Importance of compliance with diet medication stressed and she vocalized understanding.  She was advised to walk to the best of her ability.  Fall precautions were advised. Essential hypertension: Blood pressure is stable and diet was emphasized. Mixed dyslipidemia: On lipid-lowering medications followed by primary care.  I checked her lipids and they were fine this is from the [facility] sheet.  I discussed this with her at length. Patient will be seen in follow-up appointment in 12 months or earlier if the patient has any concerns.    Medication Adjustments/Labs and Tests Ordered: Current medicines are reviewed at length with the patient today.  Concerns regarding medicines are outlined above.  Orders Placed This Encounter  Procedures   EKG 12-Lead   No orders of the defined types were placed in this encounter.    Chief Complaint  Patient presents with   Follow-up     History of Present Illness:    CHELLSIE HORRIGAN is a 89 y.o. female.  Patient has past medical history of coronary artery disease, essential hypertension and dyslipidemia.  She is doing very well for her age.  She denies any chest pain orthopnea or PND.  She is ambulatory age appropriately.  At the time of my evaluation, the patient is alert awake oriented and in no distress.  Past Medical History:  Diagnosis Date   Anemia of chronic disease 05/15/2021   Closed right hip fracture  (HCC) 05/12/2021   Coronary atherosclerosis of native coronary artery 03/16/2015   Dyslipidemia 03/10/2017   Endometrial cancer (HCC) 05/27/2017   Enterocele 12/30/2017   Failed orthopedic implant, initial encounter ([facility]CC) 03/20/2021   Foot pain, right    GERD (gastroesophageal reflux disease)    History of hemiarthroplasty of right hip 01/08/2021   Hyperlipidemia    Hypertension    Hypothyroidism    Left bundle branch block (LBBB) 03/04/2017   Lumbar radiculopathy 08/23/2021   Malignant neoplasm of endometrium of corpus uteri (HCC) 05/27/2017   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) 03/20/2021   Menopausal symptom 05/29/2017   Mild aortic regurgitation 11/15/2019   Mild aortic stenosis 11/15/2019   Mild aortic valve regurgitation 11/15/2019   Mild aortic valve stenosis 11/15/2019   Neuropathy 04/29/2021   Right hip pain 05/12/2021   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   Seasonal allergies    UTI (urinary tract infection) 05/12/2021   Vaginal enterocele 12/30/2017   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    Past Surgical History:  Procedure Laterality Date   ABDOMINAL HYSTERECTOMY  2019   APPENDECTOMY     yaken with first c section    CESAREAN SECTION     x2    CONVERSION TO TOTAL HIP Right 03/20/2021   Procedure: Conversion of previous hemiarthroplasty  to right total hip arthroplasty;  Surgeon: Samson Frederic, MD;  Location: [facility];  Service: Orthopedics;  Laterality: Right;   CORONARY ARTERY BYPASS GRAFT  02/2015   DOUBLE BYPASS ; DONE AT  [facility]     EYE SURGERY  1999   bilateral cataract extraction    JOINT REPLACEMENT Right    hip   KNEE ARTHROSCOPY Left    neck back surgery     ROBOTIC ASSISTED TOTAL HYSTERECTOMY WITH BILATERAL SALPINGO OOPHERECTOMY Bilateral 06/15/2017   Procedure: XI ROBOTIC ASSISTED TOTAL HYSTERECTOMY WITH BILATERAL SALPINGO OOPHORECTOMY WITH SENTINAL LYMPH NODES, PELVIC LYMPHADENECTOMY, AND LYSIS OF ADHESIONS;  Surgeon: Adolphus Birchwood, MD;  Location: [facility];  Service: Gynecology;  Laterality: Bilateral;   ROTATOR CUFF REPAIR Left    THYROID SURGERY     THYROIDECTOMY  2000s   VAGINAL PROLAPSE REPAIR      Current Medications: Current Meds  Medication Sig   ALPRAZolam (XANAX) 0.25 MG tablet Take 1 tablet (0.25 mg total) by mouth 3 (three) times daily as needed for up to 5 doses for anxiety.   aspirin EC 81 MG tablet Take 81 mg by mouth daily. Swallow whole.   atorvastatin (LIPITOR) 40 MG tablet Take 1 tablet (40 mg total) by mouth daily.   azelastine (OPTIVAR) 0.05 % ophthalmic solution Place 1 drop into both eyes daily as needed for dry eyes.   Calcium Carb-Cholecalciferol (CALCIUM 500 + D PO) Take 1 tablet by mouth daily.   cholecalciferol (VITAMIN D3) 25 MCG (1000 UNIT) tablet Take 2,000 Units by mouth daily.   ezetimibe (ZETIA) 10 MG tablet Take 10 mg by mouth daily.    Fiber POWD Take 1 Scoop by mouth daily.   furosemide (LASIX) 40 MG tablet Take 1 tablet (40 mg total) by mouth daily.   levothyroxine (SYNTHROID) 88 MCG tablet Take 88 mcg by mouth daily.   lisinopril (PRINIVIL,ZESTRIL) 10 MG tablet Take 10 mg by mouth at bedtime.   metoprolol succinate (TOPROL-XL) 50 MG 24 hr tablet Take 50 mg by mouth daily.   Multiple Vitamin (MULTIVITAMIN WITH MINERALS) TABS tablet Take 1 tablet by mouth daily.   nitroGLYCERIN (NITROSTAT) 0.4 MG SL tablet Place 0.4 mg under the tongue every 5 (five) minutes as needed for chest pain.   omeprazole (PRILOSEC) 20 MG capsule Take 20 mg by mouth daily.     Allergies:   Patient has no known allergies.   Social History   Socioeconomic History   Marital status: Widowed    Spouse name: Not on file   Number of children: Not on file   Years of education: Not on file   Highest education level: Not on file  Occupational History   Not on file  Tobacco Use   Smoking status: Never   Smokeless tobacco: Never  Vaping Use   Vaping Use: Never used  Substance and Sexual Activity    Alcohol use: No   Drug use: No   Sexual activity: Not on file  Other Topics Concern   Not on file  Social History Narrative   Lives alone,retired.   Husband deceased   Social Determinants of Health   Financial Resource Strain: Not on file  Food Insecurity: No Food Insecurity (05/29/2021)   Hunger Vital Sign    Worried About Running Out of Food in the Last Year: Never true    Ran Out of Food in the Last Year: Never true  Transportation Needs: No Transportation Needs (05/29/2021)   PRAPARE - Administrator, Civil Service (Medical): No    Lack of Transportation (Non-Medical): No  Physical Activity: Not on file  Stress: Not on file  Social Connections: Not on file     Family History: The patient's family history includes Depression in her father; Heart failure in her mother; Kidney cancer in her brother; Leukemia in her brother.  ROS:   Please see the history of present illness.    All other systems reviewed and are negative.  EKGs/Labs/Other Studies Reviewed:    The following studies were reviewed today: I discussed my findings with the patient at length   Recent Labs: No results found for requested labs within last 365 days.  Recent Lipid Panel    Component Value Date/Time   CHOL 136 03/11/2018 0808   TRIG 157 (H) 03/11/2018 0808   HDL 36 (L) 03/11/2018 0808   CHOLHDL 3.8 03/11/2018 0808   LDLCALC 69 03/11/2018 0808    Physical Exam:    VS:  BP 130/60 (BP Location: Left Arm, Patient Position: Sitting, Cuff Size: Normal)   Pulse 73   Ht 5' (1.524 m)   Wt 129 lb (58.5 kg)   SpO2 97%   BMI 25.19 kg/m     Wt Readings from Last 3 Encounters:  08/25/22 129 lb (58.5 kg)  08/12/21 126 lb 12.8 oz (57.5 kg)  03/20/21 135 lb (61.2 kg)     GEN: Patient is in no acute distress HEENT: Normal NECK: No JVD; No carotid bruits LYMPHATICS: No lymphadenopathy CARDIAC: Hear sounds regular, 2/6 systolic murmur at the apex. RESPIRATORY:  Clear to auscultation  without rales, wheezing or rhonchi  ABDOMEN: Soft, non-tender, non-distended MUSCULOSKELETAL:  No edema; No deformity  SKIN: Warm and dry NEUROLOGIC:  Alert and oriented x 3 PSYCHIATRIC:  Normal affect   Signed, Garwin Brothers, MD  08/25/2022 4:26 PM    [facility]

## 2022-08-25 NOTE — Patient Instructions (Signed)
Medication Instructions:  Your physician recommends that you continue on your current medications as directed. Please refer to the Current Medication list given to you today.  *If you need a refill on your cardiac medications before your next appointment, please call your pharmacy*   Lab Work: None ordered If you have labs (blood work) drawn today and your tests are completely normal, you will receive your results only by: MyChart Message (if you have MyChart) OR A paper copy in the mail If you have any lab test that is abnormal or we need to change your treatment, we will call you to review the results.   Testing/Procedures: None ordered   Follow-Up: At [facility], you and your health needs are our priority.  As part of our continuing mission to provide you with exceptional heart care, we have created designated Provider Care Teams.  These Care Teams include your primary Cardiologist (physician) and Advanced Practice Providers (APPs -  Physician Assistants and Nurse Practitioners) who all work together to provide you with the care you need, when you need it.  We recommend signing up for the patient portal called "MyChart".  Sign up information is provided on this After Visit Summary.  MyChart is used to connect with patients for Virtual Visits (Telemedicine).  Patients are able to view lab/test results, encounter notes, upcoming appointments, etc.  Non-urgent messages can be sent to your provider as well.   To learn more about what you can do with MyChart, go to [email or web address].    Your next appointment:   12 month(s)  The format for your next appointment:   In Person  Provider:   Rajan Revankar, MD    Other Instructions none  Important Information About Sugar      

## 2022-09-10 ENCOUNTER — Telehealth: Payer: Self-pay | Admitting: Cardiology

## 2022-09-10 MED ORDER — ATORVASTATIN CALCIUM 40 MG PO TABS: 40.0000 mg | ORAL_TABLET | Freq: Every day | ORAL | 0 refills | Status: DC

## 2022-09-10 NOTE — Telephone Encounter (Signed)
*  STAT* If patient is at the pharmacy, call can be transferred to refill team.   1. Which medications need to be refilled? (please list name of each medication and dose if known) atorvastatin (LIPITOR) 40 MG tablet  2. Which pharmacy/location (including street and city if local pharmacy) is medication to be sent to? [facility] - [place], [place] - [address]  3. Do they need a 30 day or 90 day supply?  90 day supply

## 2022-09-25 IMAGING — DX DG PORTABLE PELVIS
1 series · 1 of 1 positions shown · non-contrast
Comparison: Pelvic x-ray 03/20/2021

CLINICAL DATA: Right hip fracture

EXAM:
PORTABLE PELVIS 1-2 VIEWS

[pelvis ap]
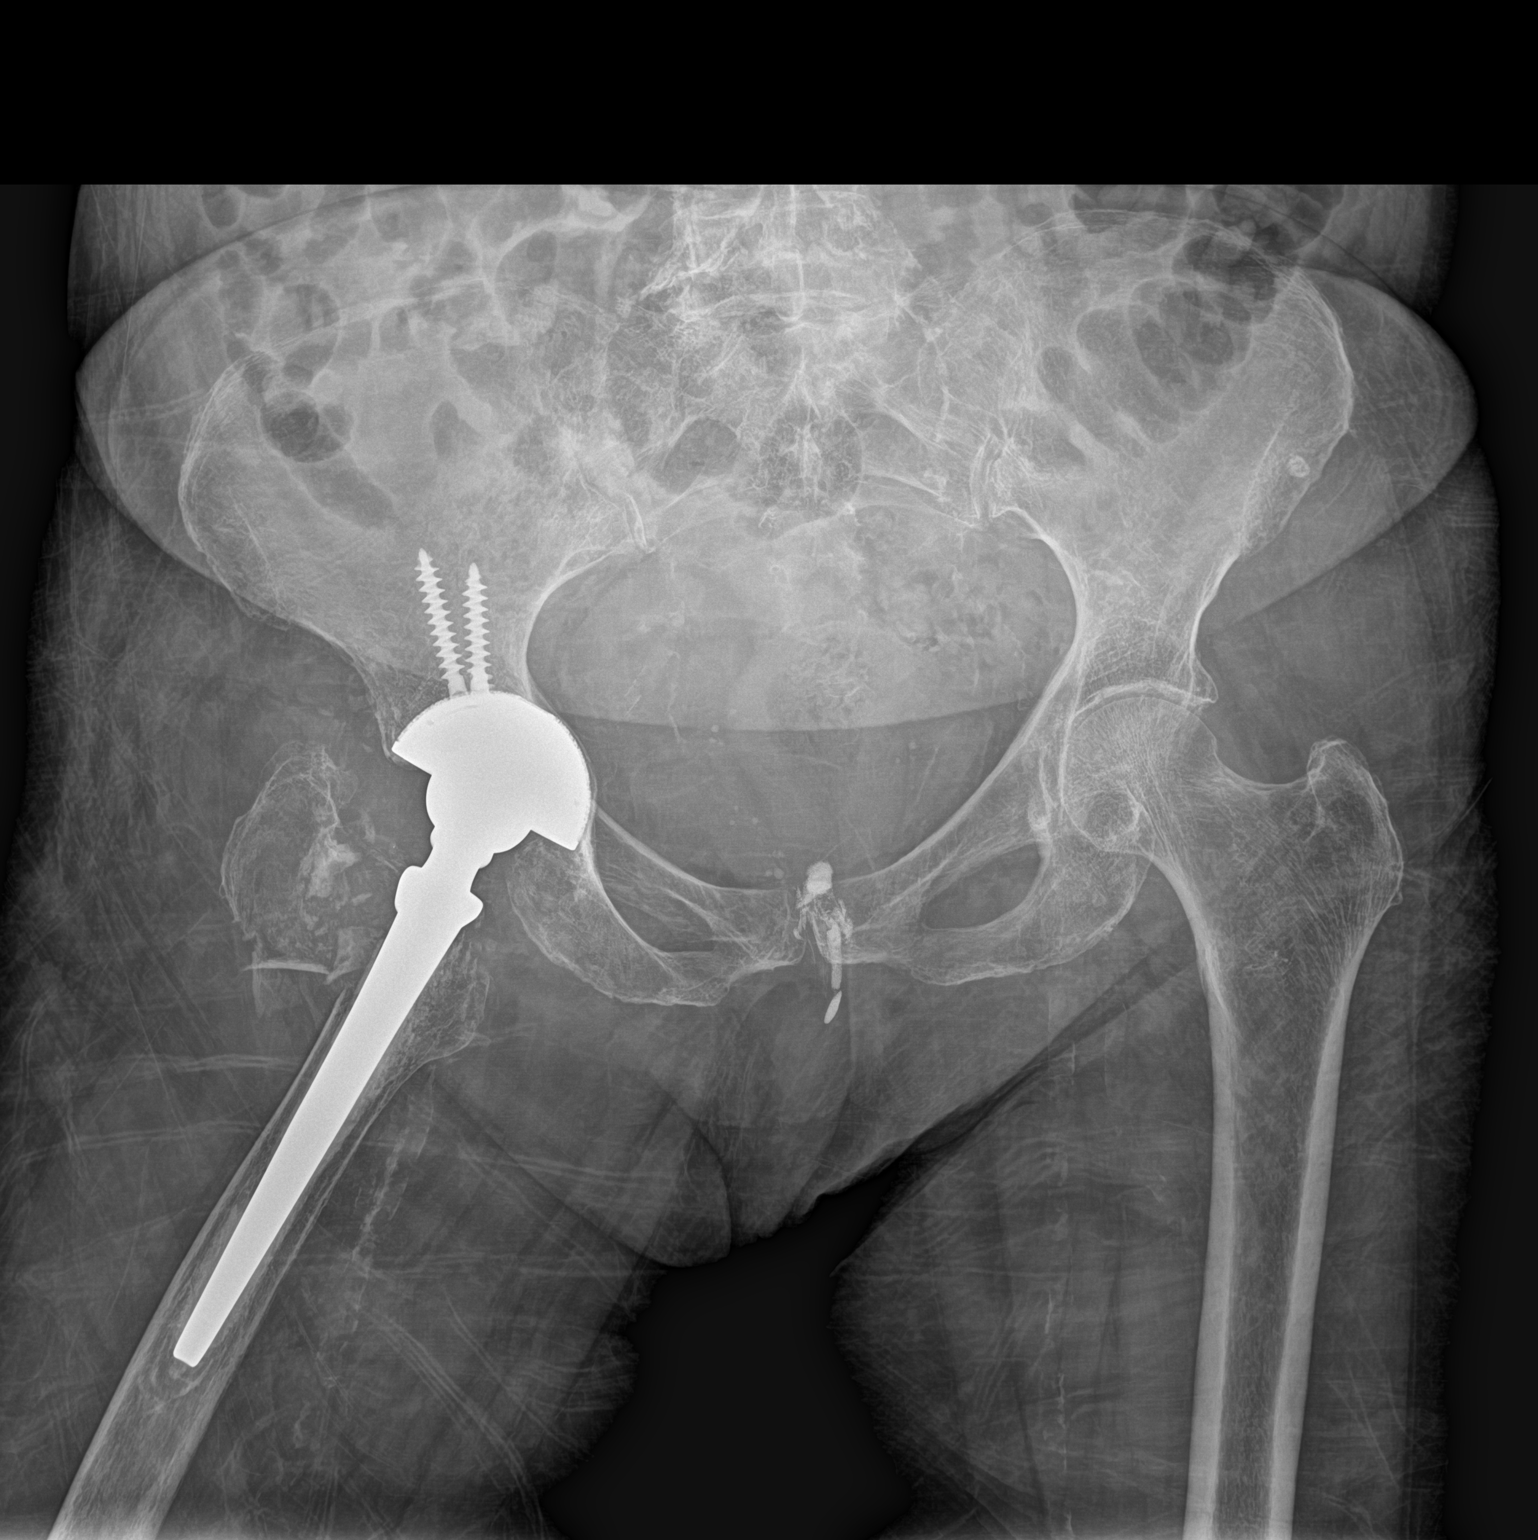

[1 of 1 positions shown; findings below may reference images not displayed]

FINDINGS: Acute comminuted periprosthetic fracture of the proximal right femur
with mild-to-moderate displacement of fracture fragments. No
additional fracture identified. Bones are osteopenic. Right hip
arthroplasty, hardware appears intact.
IMPRESSION: Acute comminuted and displaced periprosthetic fracture of the
proximal right femur.

## 2022-09-26 IMAGING — DX DG FOOT 2V*R*
2 series · 2 of 2 positions shown · non-contrast
Comparison: 04/11/2021

CLINICAL DATA: Numbness and pain

EXAM:
RIGHT FOOT - 2 VIEW

[foot lat]
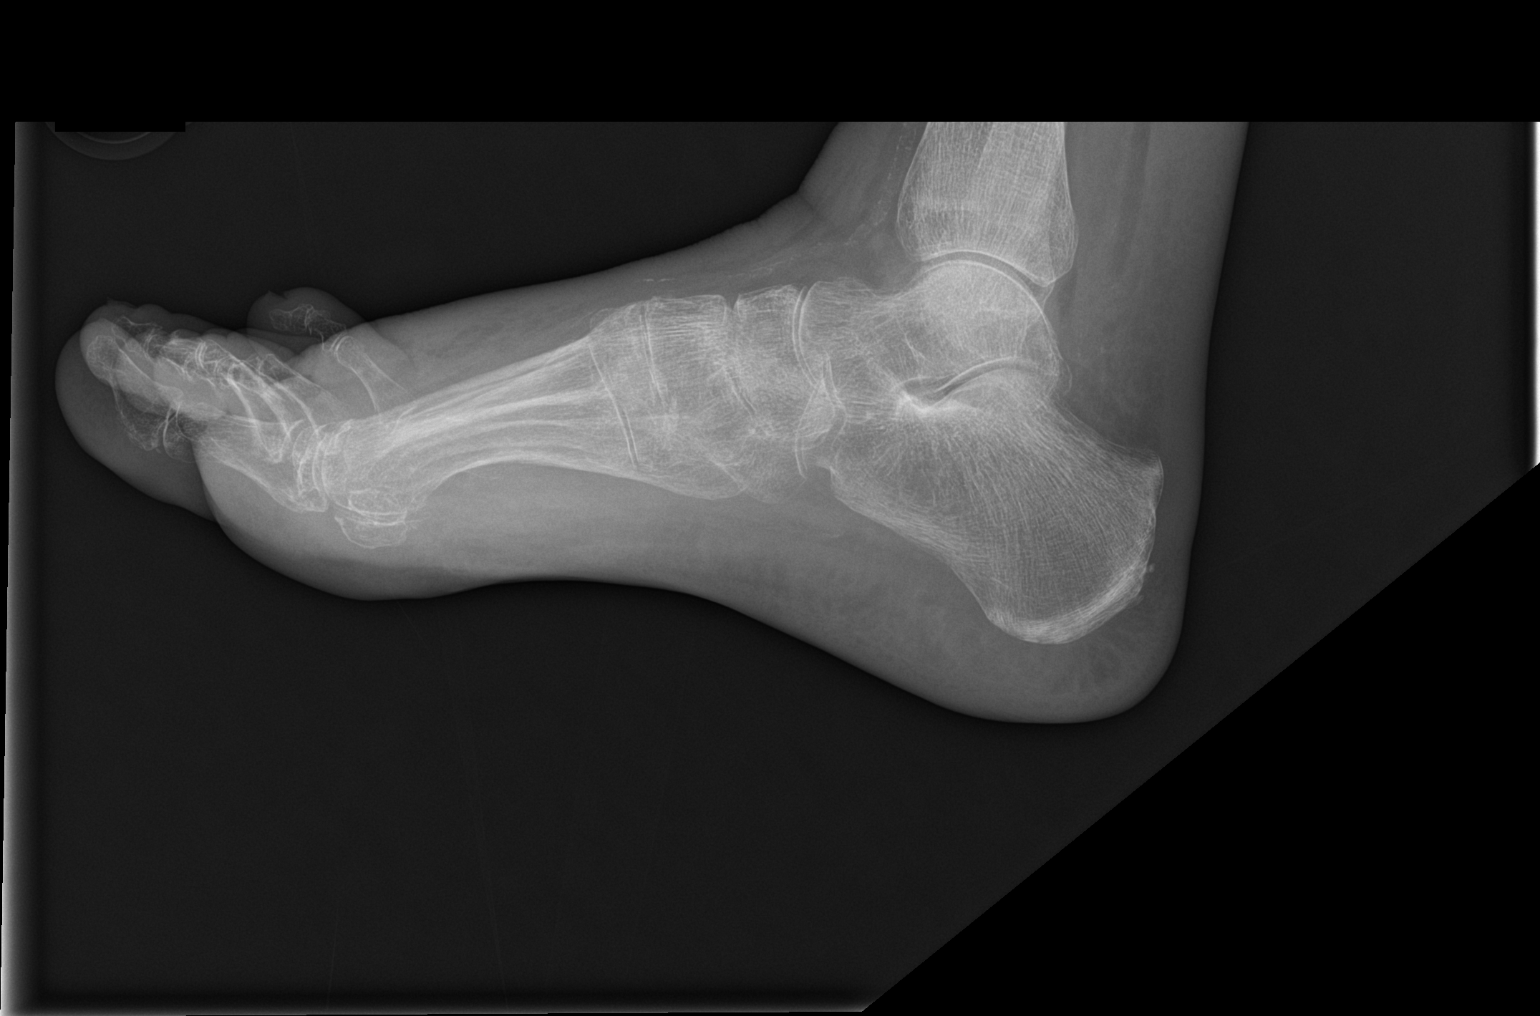

[foot ap]
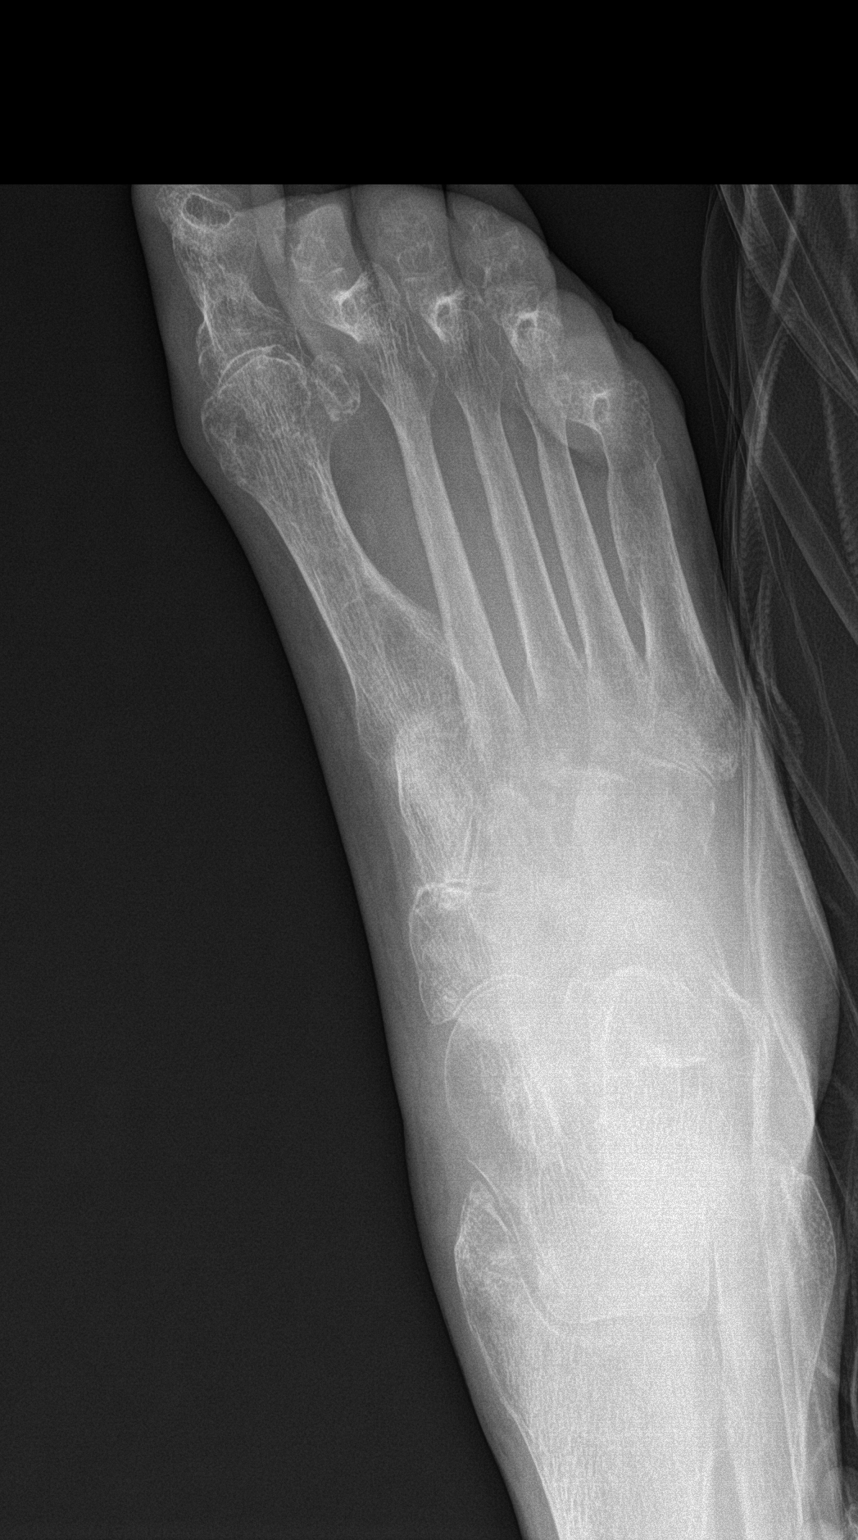

[2 of 2 positions shown; findings below may reference images not displayed]

FINDINGS: No recent fracture or dislocation is seen. No focal lytic lesions
are seen. Deformity in the shaft of fifth metatarsal may suggest old
healed fracture. Osteopenia is seen in bony structures. Arterial
calcifications are seen in the soft tissues. There is soft tissue
swelling over the dorsum.
IMPRESSION: No recent fracture or dislocation is seen in the right foot.
Osteopenia.

## 2022-09-29 DIAGNOSIS — R110 Nausea: Secondary | ICD-10-CM | POA: Diagnosis not present

## 2022-09-29 DIAGNOSIS — K219 Gastro-esophageal reflux disease without esophagitis: Secondary | ICD-10-CM | POA: Diagnosis not present

## 2022-09-30 DIAGNOSIS — S3993XA Unspecified injury of pelvis, initial encounter: Secondary | ICD-10-CM | POA: Diagnosis not present

## 2022-09-30 DIAGNOSIS — I10 Essential (primary) hypertension: Secondary | ICD-10-CM | POA: Diagnosis not present

## 2022-09-30 DIAGNOSIS — Z96641 Presence of right artificial hip joint: Secondary | ICD-10-CM | POA: Diagnosis not present

## 2022-09-30 DIAGNOSIS — S79001A Unspecified physeal fracture of upper end of right femur, initial encounter for closed fracture: Secondary | ICD-10-CM | POA: Diagnosis not present

## 2022-09-30 DIAGNOSIS — M25551 Pain in right hip: Secondary | ICD-10-CM | POA: Diagnosis not present

## 2022-09-30 DIAGNOSIS — S7001XA Contusion of right hip, initial encounter: Secondary | ICD-10-CM | POA: Diagnosis not present

## 2022-09-30 DIAGNOSIS — Z043 Encounter for examination and observation following other accident: Secondary | ICD-10-CM | POA: Diagnosis not present

## 2022-09-30 DIAGNOSIS — M1711 Unilateral primary osteoarthritis, right knee: Secondary | ICD-10-CM | POA: Diagnosis not present

## 2022-09-30 DIAGNOSIS — M8588 Other specified disorders of bone density and structure, other site: Secondary | ICD-10-CM | POA: Diagnosis not present

## 2022-09-30 DIAGNOSIS — S51011A Laceration without foreign body of right elbow, initial encounter: Secondary | ICD-10-CM | POA: Diagnosis not present

## 2022-09-30 DIAGNOSIS — M84451A Pathological fracture, right femur, initial encounter for fracture: Secondary | ICD-10-CM | POA: Diagnosis not present

## 2022-09-30 DIAGNOSIS — W19XXXA Unspecified fall, initial encounter: Secondary | ICD-10-CM | POA: Diagnosis not present

## 2022-10-01 DIAGNOSIS — L03114 Cellulitis of left upper limb: Secondary | ICD-10-CM | POA: Diagnosis not present

## 2022-10-01 DIAGNOSIS — E785 Hyperlipidemia, unspecified: Secondary | ICD-10-CM | POA: Diagnosis not present

## 2022-10-01 DIAGNOSIS — R7401 Elevation of levels of liver transaminase levels: Secondary | ICD-10-CM | POA: Diagnosis not present

## 2022-10-01 DIAGNOSIS — Z79899 Other long term (current) drug therapy: Secondary | ICD-10-CM | POA: Diagnosis not present

## 2022-10-01 DIAGNOSIS — S3993XA Unspecified injury of pelvis, initial encounter: Secondary | ICD-10-CM | POA: Diagnosis not present

## 2022-10-01 DIAGNOSIS — Z043 Encounter for examination and observation following other accident: Secondary | ICD-10-CM | POA: Diagnosis not present

## 2022-10-01 DIAGNOSIS — I447 Left bundle-branch block, unspecified: Secondary | ICD-10-CM | POA: Diagnosis not present

## 2022-10-01 DIAGNOSIS — Z96641 Presence of right artificial hip joint: Secondary | ICD-10-CM | POA: Diagnosis not present

## 2022-10-01 DIAGNOSIS — E039 Hypothyroidism, unspecified: Secondary | ICD-10-CM | POA: Diagnosis not present

## 2022-10-01 DIAGNOSIS — M1711 Unilateral primary osteoarthritis, right knee: Secondary | ICD-10-CM | POA: Diagnosis not present

## 2022-10-01 DIAGNOSIS — S79001A Unspecified physeal fracture of upper end of right femur, initial encounter for closed fracture: Secondary | ICD-10-CM | POA: Diagnosis not present

## 2022-10-01 DIAGNOSIS — R1312 Dysphagia, oropharyngeal phase: Secondary | ICD-10-CM | POA: Diagnosis not present

## 2022-10-01 DIAGNOSIS — R41841 Cognitive communication deficit: Secondary | ICD-10-CM | POA: Diagnosis not present

## 2022-10-01 DIAGNOSIS — D631 Anemia in chronic kidney disease: Secondary | ICD-10-CM | POA: Diagnosis not present

## 2022-10-01 DIAGNOSIS — M8588 Other specified disorders of bone density and structure, other site: Secondary | ICD-10-CM | POA: Diagnosis not present

## 2022-10-01 DIAGNOSIS — M25551 Pain in right hip: Secondary | ICD-10-CM | POA: Diagnosis not present

## 2022-10-01 DIAGNOSIS — M6259 Muscle wasting and atrophy, not elsewhere classified, multiple sites: Secondary | ICD-10-CM | POA: Diagnosis not present

## 2022-10-01 DIAGNOSIS — J45909 Unspecified asthma, uncomplicated: Secondary | ICD-10-CM | POA: Diagnosis not present

## 2022-10-01 DIAGNOSIS — I2510 Atherosclerotic heart disease of native coronary artery without angina pectoris: Secondary | ICD-10-CM | POA: Diagnosis not present

## 2022-10-01 DIAGNOSIS — W19XXXA Unspecified fall, initial encounter: Secondary | ICD-10-CM | POA: Diagnosis not present

## 2022-10-01 DIAGNOSIS — Z7401 Bed confinement status: Secondary | ICD-10-CM | POA: Diagnosis not present

## 2022-10-01 DIAGNOSIS — S7001XA Contusion of right hip, initial encounter: Secondary | ICD-10-CM | POA: Diagnosis not present

## 2022-10-01 DIAGNOSIS — R4181 Age-related cognitive decline: Secondary | ICD-10-CM | POA: Diagnosis not present

## 2022-10-01 DIAGNOSIS — S41111A Laceration without foreign body of right upper arm, initial encounter: Secondary | ICD-10-CM | POA: Diagnosis not present

## 2022-10-01 DIAGNOSIS — I129 Hypertensive chronic kidney disease with stage 1 through stage 4 chronic kidney disease, or unspecified chronic kidney disease: Secondary | ICD-10-CM | POA: Diagnosis not present

## 2022-10-01 DIAGNOSIS — M84451A Pathological fracture, right femur, initial encounter for fracture: Secondary | ICD-10-CM | POA: Diagnosis not present

## 2022-10-01 DIAGNOSIS — I700 Atherosclerosis of aorta: Secondary | ICD-10-CM | POA: Diagnosis not present

## 2022-10-01 DIAGNOSIS — S51011A Laceration without foreign body of right elbow, initial encounter: Secondary | ICD-10-CM | POA: Diagnosis not present

## 2022-10-01 DIAGNOSIS — I10 Essential (primary) hypertension: Secondary | ICD-10-CM | POA: Diagnosis not present

## 2022-10-01 DIAGNOSIS — Z951 Presence of aortocoronary bypass graft: Secondary | ICD-10-CM | POA: Diagnosis not present

## 2022-10-01 DIAGNOSIS — N1832 Chronic kidney disease, stage 3b: Secondary | ICD-10-CM | POA: Diagnosis not present

## 2022-10-01 DIAGNOSIS — Z792 Long term (current) use of antibiotics: Secondary | ICD-10-CM | POA: Diagnosis not present

## 2022-10-01 DIAGNOSIS — R531 Weakness: Secondary | ICD-10-CM | POA: Diagnosis not present

## 2022-10-01 DIAGNOSIS — R262 Difficulty in walking, not elsewhere classified: Secondary | ICD-10-CM | POA: Diagnosis not present

## 2022-10-01 DIAGNOSIS — D692 Other nonthrombocytopenic purpura: Secondary | ICD-10-CM | POA: Diagnosis not present

## 2022-10-01 DIAGNOSIS — Z8679 Personal history of other diseases of the circulatory system: Secondary | ICD-10-CM | POA: Diagnosis not present

## 2022-10-01 DIAGNOSIS — S51019A Laceration without foreign body of unspecified elbow, initial encounter: Secondary | ICD-10-CM | POA: Diagnosis not present

## 2022-10-01 DIAGNOSIS — Z7982 Long term (current) use of aspirin: Secondary | ICD-10-CM | POA: Diagnosis not present

## 2022-10-01 DIAGNOSIS — R296 Repeated falls: Secondary | ICD-10-CM | POA: Diagnosis not present

## 2022-10-03 DIAGNOSIS — I447 Left bundle-branch block, unspecified: Secondary | ICD-10-CM | POA: Diagnosis not present

## 2022-10-04 DIAGNOSIS — R531 Weakness: Secondary | ICD-10-CM | POA: Diagnosis not present

## 2022-10-04 DIAGNOSIS — W19XXXA Unspecified fall, initial encounter: Secondary | ICD-10-CM | POA: Diagnosis not present

## 2022-10-04 DIAGNOSIS — I10 Essential (primary) hypertension: Secondary | ICD-10-CM | POA: Diagnosis not present

## 2022-10-04 DIAGNOSIS — R262 Difficulty in walking, not elsewhere classified: Secondary | ICD-10-CM | POA: Diagnosis not present

## 2022-10-04 DIAGNOSIS — Z7401 Bed confinement status: Secondary | ICD-10-CM | POA: Diagnosis not present

## 2022-10-04 DIAGNOSIS — D692 Other nonthrombocytopenic purpura: Secondary | ICD-10-CM | POA: Diagnosis not present

## 2022-10-04 DIAGNOSIS — R1312 Dysphagia, oropharyngeal phase: Secondary | ICD-10-CM | POA: Diagnosis not present

## 2022-10-04 DIAGNOSIS — I2510 Atherosclerotic heart disease of native coronary artery without angina pectoris: Secondary | ICD-10-CM | POA: Diagnosis not present

## 2022-10-04 DIAGNOSIS — S51019A Laceration without foreign body of unspecified elbow, initial encounter: Secondary | ICD-10-CM | POA: Diagnosis not present

## 2022-10-04 DIAGNOSIS — I700 Atherosclerosis of aorta: Secondary | ICD-10-CM | POA: Diagnosis not present

## 2022-10-04 DIAGNOSIS — R41841 Cognitive communication deficit: Secondary | ICD-10-CM | POA: Diagnosis not present

## 2022-10-04 DIAGNOSIS — L03114 Cellulitis of left upper limb: Secondary | ICD-10-CM | POA: Diagnosis not present

## 2022-10-04 DIAGNOSIS — E785 Hyperlipidemia, unspecified: Secondary | ICD-10-CM | POA: Diagnosis not present

## 2022-10-04 DIAGNOSIS — M25551 Pain in right hip: Secondary | ICD-10-CM | POA: Diagnosis not present

## 2022-10-04 DIAGNOSIS — R296 Repeated falls: Secondary | ICD-10-CM | POA: Diagnosis not present

## 2022-10-04 DIAGNOSIS — M6259 Muscle wasting and atrophy, not elsewhere classified, multiple sites: Secondary | ICD-10-CM | POA: Diagnosis not present

## 2022-10-04 DIAGNOSIS — R4181 Age-related cognitive decline: Secondary | ICD-10-CM | POA: Diagnosis not present

## 2022-10-04 DIAGNOSIS — E039 Hypothyroidism, unspecified: Secondary | ICD-10-CM | POA: Diagnosis not present

## 2022-10-07 DIAGNOSIS — I10 Essential (primary) hypertension: Secondary | ICD-10-CM | POA: Diagnosis not present

## 2022-10-07 DIAGNOSIS — I2510 Atherosclerotic heart disease of native coronary artery without angina pectoris: Secondary | ICD-10-CM | POA: Diagnosis not present

## 2022-10-07 DIAGNOSIS — M25551 Pain in right hip: Secondary | ICD-10-CM | POA: Diagnosis not present

## 2022-10-07 DIAGNOSIS — R262 Difficulty in walking, not elsewhere classified: Secondary | ICD-10-CM | POA: Diagnosis not present

## 2022-10-08 DIAGNOSIS — R262 Difficulty in walking, not elsewhere classified: Secondary | ICD-10-CM | POA: Diagnosis not present

## 2022-10-08 DIAGNOSIS — I2510 Atherosclerotic heart disease of native coronary artery without angina pectoris: Secondary | ICD-10-CM | POA: Diagnosis not present

## 2022-10-08 DIAGNOSIS — M25551 Pain in right hip: Secondary | ICD-10-CM | POA: Diagnosis not present

## 2022-10-08 DIAGNOSIS — I700 Atherosclerosis of aorta: Secondary | ICD-10-CM | POA: Diagnosis not present

## 2022-10-13 ENCOUNTER — Encounter: Payer: Self-pay | Admitting: Internal Medicine

## 2022-10-19 DIAGNOSIS — E039 Hypothyroidism, unspecified: Secondary | ICD-10-CM | POA: Diagnosis not present

## 2022-10-19 DIAGNOSIS — M25551 Pain in right hip: Secondary | ICD-10-CM | POA: Diagnosis not present

## 2022-10-19 DIAGNOSIS — S7411XA Injury of femoral nerve at hip and thigh level, right leg, initial encounter: Secondary | ICD-10-CM | POA: Diagnosis not present

## 2022-10-19 DIAGNOSIS — D692 Other nonthrombocytopenic purpura: Secondary | ICD-10-CM | POA: Diagnosis not present

## 2022-10-19 DIAGNOSIS — E785 Hyperlipidemia, unspecified: Secondary | ICD-10-CM | POA: Diagnosis not present

## 2022-10-19 DIAGNOSIS — I700 Atherosclerosis of aorta: Secondary | ICD-10-CM | POA: Diagnosis not present

## 2022-10-19 DIAGNOSIS — Z96641 Presence of right artificial hip joint: Secondary | ICD-10-CM | POA: Diagnosis not present

## 2022-10-19 DIAGNOSIS — Z9181 History of falling: Secondary | ICD-10-CM | POA: Diagnosis not present

## 2022-10-19 DIAGNOSIS — I10 Essential (primary) hypertension: Secondary | ICD-10-CM | POA: Diagnosis not present

## 2022-10-19 DIAGNOSIS — I2510 Atherosclerotic heart disease of native coronary artery without angina pectoris: Secondary | ICD-10-CM | POA: Diagnosis not present

## 2022-10-22 DIAGNOSIS — Z96641 Presence of right artificial hip joint: Secondary | ICD-10-CM | POA: Diagnosis not present

## 2022-10-22 DIAGNOSIS — I2510 Atherosclerotic heart disease of native coronary artery without angina pectoris: Secondary | ICD-10-CM | POA: Diagnosis not present

## 2022-10-22 DIAGNOSIS — M25551 Pain in right hip: Secondary | ICD-10-CM | POA: Diagnosis not present

## 2022-10-22 DIAGNOSIS — I10 Essential (primary) hypertension: Secondary | ICD-10-CM | POA: Diagnosis not present

## 2022-10-22 DIAGNOSIS — I700 Atherosclerosis of aorta: Secondary | ICD-10-CM | POA: Diagnosis not present

## 2022-10-22 DIAGNOSIS — S7411XA Injury of femoral nerve at hip and thigh level, right leg, initial encounter: Secondary | ICD-10-CM | POA: Diagnosis not present

## 2022-10-28 DIAGNOSIS — I700 Atherosclerosis of aorta: Secondary | ICD-10-CM | POA: Diagnosis not present

## 2022-10-28 DIAGNOSIS — S7411XA Injury of femoral nerve at hip and thigh level, right leg, initial encounter: Secondary | ICD-10-CM | POA: Diagnosis not present

## 2022-10-28 DIAGNOSIS — I2510 Atherosclerotic heart disease of native coronary artery without angina pectoris: Secondary | ICD-10-CM | POA: Diagnosis not present

## 2022-10-28 DIAGNOSIS — I10 Essential (primary) hypertension: Secondary | ICD-10-CM | POA: Diagnosis not present

## 2022-10-28 DIAGNOSIS — Z96641 Presence of right artificial hip joint: Secondary | ICD-10-CM | POA: Diagnosis not present

## 2022-10-28 DIAGNOSIS — M25551 Pain in right hip: Secondary | ICD-10-CM | POA: Diagnosis not present

## 2022-11-03 DIAGNOSIS — M25551 Pain in right hip: Secondary | ICD-10-CM | POA: Diagnosis not present

## 2022-11-03 DIAGNOSIS — I2510 Atherosclerotic heart disease of native coronary artery without angina pectoris: Secondary | ICD-10-CM | POA: Diagnosis not present

## 2022-11-03 DIAGNOSIS — Z96641 Presence of right artificial hip joint: Secondary | ICD-10-CM | POA: Diagnosis not present

## 2022-11-03 DIAGNOSIS — S7411XA Injury of femoral nerve at hip and thigh level, right leg, initial encounter: Secondary | ICD-10-CM | POA: Diagnosis not present

## 2022-11-03 DIAGNOSIS — I700 Atherosclerosis of aorta: Secondary | ICD-10-CM | POA: Diagnosis not present

## 2022-11-03 DIAGNOSIS — I10 Essential (primary) hypertension: Secondary | ICD-10-CM | POA: Diagnosis not present

## 2022-11-17 ENCOUNTER — Other Ambulatory Visit: Payer: Self-pay | Admitting: Cardiology

## 2022-11-18 DIAGNOSIS — D692 Other nonthrombocytopenic purpura: Secondary | ICD-10-CM | POA: Diagnosis not present

## 2022-11-18 DIAGNOSIS — I700 Atherosclerosis of aorta: Secondary | ICD-10-CM | POA: Diagnosis not present

## 2022-11-18 DIAGNOSIS — S7411XA Injury of femoral nerve at hip and thigh level, right leg, initial encounter: Secondary | ICD-10-CM | POA: Diagnosis not present

## 2022-11-18 DIAGNOSIS — E039 Hypothyroidism, unspecified: Secondary | ICD-10-CM | POA: Diagnosis not present

## 2022-11-18 DIAGNOSIS — E785 Hyperlipidemia, unspecified: Secondary | ICD-10-CM | POA: Diagnosis not present

## 2022-11-18 DIAGNOSIS — Z96641 Presence of right artificial hip joint: Secondary | ICD-10-CM | POA: Diagnosis not present

## 2022-11-18 DIAGNOSIS — I10 Essential (primary) hypertension: Secondary | ICD-10-CM | POA: Diagnosis not present

## 2022-11-18 DIAGNOSIS — Z9181 History of falling: Secondary | ICD-10-CM | POA: Diagnosis not present

## 2022-11-18 DIAGNOSIS — I2510 Atherosclerotic heart disease of native coronary artery without angina pectoris: Secondary | ICD-10-CM | POA: Diagnosis not present

## 2022-11-18 DIAGNOSIS — M25551 Pain in right hip: Secondary | ICD-10-CM | POA: Diagnosis not present

## 2022-11-24 DIAGNOSIS — Z96641 Presence of right artificial hip joint: Secondary | ICD-10-CM | POA: Diagnosis not present

## 2022-11-24 DIAGNOSIS — I700 Atherosclerosis of aorta: Secondary | ICD-10-CM | POA: Diagnosis not present

## 2022-11-24 DIAGNOSIS — M25551 Pain in right hip: Secondary | ICD-10-CM | POA: Diagnosis not present

## 2022-11-24 DIAGNOSIS — I10 Essential (primary) hypertension: Secondary | ICD-10-CM | POA: Diagnosis not present

## 2022-11-24 DIAGNOSIS — I2510 Atherosclerotic heart disease of native coronary artery without angina pectoris: Secondary | ICD-10-CM | POA: Diagnosis not present

## 2022-11-24 DIAGNOSIS — S7411XA Injury of femoral nerve at hip and thigh level, right leg, initial encounter: Secondary | ICD-10-CM | POA: Diagnosis not present

## 2022-11-25 DIAGNOSIS — Z23 Encounter for immunization: Secondary | ICD-10-CM | POA: Diagnosis not present

## 2023-01-07 DIAGNOSIS — M1712 Unilateral primary osteoarthritis, left knee: Secondary | ICD-10-CM | POA: Diagnosis not present

## 2023-01-07 DIAGNOSIS — E785 Hyperlipidemia, unspecified: Secondary | ICD-10-CM | POA: Diagnosis not present

## 2023-01-07 DIAGNOSIS — Z1331 Encounter for screening for depression: Secondary | ICD-10-CM | POA: Diagnosis not present

## 2023-01-07 DIAGNOSIS — J208 Acute bronchitis due to other specified organisms: Secondary | ICD-10-CM | POA: Diagnosis not present

## 2023-01-07 DIAGNOSIS — I2510 Atherosclerotic heart disease of native coronary artery without angina pectoris: Secondary | ICD-10-CM | POA: Diagnosis not present

## 2023-01-07 DIAGNOSIS — Z9181 History of falling: Secondary | ICD-10-CM | POA: Diagnosis not present

## 2023-01-07 DIAGNOSIS — E890 Postprocedural hypothyroidism: Secondary | ICD-10-CM | POA: Diagnosis not present

## 2023-01-07 DIAGNOSIS — E559 Vitamin D deficiency, unspecified: Secondary | ICD-10-CM | POA: Diagnosis not present

## 2023-01-07 DIAGNOSIS — D62 Acute posthemorrhagic anemia: Secondary | ICD-10-CM | POA: Diagnosis not present

## 2023-01-30 DIAGNOSIS — N390 Urinary tract infection, site not specified: Secondary | ICD-10-CM | POA: Diagnosis not present

## 2023-02-10 DIAGNOSIS — R7401 Elevation of levels of liver transaminase levels: Secondary | ICD-10-CM | POA: Diagnosis not present

## 2023-02-10 DIAGNOSIS — E890 Postprocedural hypothyroidism: Secondary | ICD-10-CM | POA: Diagnosis not present

## 2023-02-23 ENCOUNTER — Ambulatory Visit (INDEPENDENT_AMBULATORY_CARE_PROVIDER_SITE_OTHER): Payer: Medicare Other | Admitting: Podiatry

## 2023-02-23 ENCOUNTER — Encounter: Payer: Self-pay | Admitting: Podiatry

## 2023-02-23 DIAGNOSIS — L97522 Non-pressure chronic ulcer of other part of left foot with fat layer exposed: Secondary | ICD-10-CM

## 2023-02-23 NOTE — Patient Instructions (Signed)
More silicone pads can be purchased from:  [email or web address]  

## 2023-03-09 ENCOUNTER — Ambulatory Visit: Payer: Medicare Other | Admitting: Podiatry

## 2023-06-08 DIAGNOSIS — M1712 Unilateral primary osteoarthritis, left knee: Secondary | ICD-10-CM | POA: Diagnosis not present

## 2023-07-07 DIAGNOSIS — E890 Postprocedural hypothyroidism: Secondary | ICD-10-CM | POA: Diagnosis not present

## 2023-07-07 DIAGNOSIS — I10 Essential (primary) hypertension: Secondary | ICD-10-CM | POA: Diagnosis not present

## 2023-07-07 DIAGNOSIS — D62 Acute posthemorrhagic anemia: Secondary | ICD-10-CM | POA: Diagnosis not present

## 2023-07-07 DIAGNOSIS — E559 Vitamin D deficiency, unspecified: Secondary | ICD-10-CM | POA: Diagnosis not present

## 2023-07-07 DIAGNOSIS — E785 Hyperlipidemia, unspecified: Secondary | ICD-10-CM | POA: Diagnosis not present

## 2023-07-07 DIAGNOSIS — I2510 Atherosclerotic heart disease of native coronary artery without angina pectoris: Secondary | ICD-10-CM | POA: Diagnosis not present

## 2023-08-20 ENCOUNTER — Encounter: Payer: Self-pay | Admitting: Cardiology

## 2023-08-20 ENCOUNTER — Ambulatory Visit: Attending: Cardiology | Admitting: Cardiology

## 2023-08-20 VITALS — BP 134/70 | HR 70 | Ht 60.0 in | Wt 126.0 lb

## 2023-08-20 DIAGNOSIS — Z951 Presence of aortocoronary bypass graft: Secondary | ICD-10-CM | POA: Insufficient documentation

## 2023-08-20 DIAGNOSIS — E785 Hyperlipidemia, unspecified: Secondary | ICD-10-CM | POA: Insufficient documentation

## 2023-08-20 DIAGNOSIS — I2510 Atherosclerotic heart disease of native coronary artery without angina pectoris: Secondary | ICD-10-CM | POA: Diagnosis not present

## 2023-08-20 DIAGNOSIS — I350 Nonrheumatic aortic (valve) stenosis: Secondary | ICD-10-CM | POA: Diagnosis not present

## 2023-08-20 MED ORDER — ATORVASTATIN CALCIUM 40 MG PO TABS: 40.0000 mg | ORAL_TABLET | Freq: Every day | ORAL | 3 refills | Status: AC

## 2023-08-20 NOTE — Patient Instructions (Signed)

## 2023-08-20 NOTE — Progress Notes (Signed)
 Cardiology Office Note:    Date:  08/20/2023   ID:  Brittany Santiago, DOB 1930-09-19, MRN [id number]  PCP:  Brittany Vicenta BRAVO, MD  Cardiologist:  Jennifer JONELLE Crape, MD   Referring MD: Brittany Vicenta BRAVO, MD    ASSESSMENT:    1. Mild aortic valve stenosis   2. Atherosclerosis of native coronary artery of native heart without angina pectoris   3. S/P CABG x 2   4. Dyslipidemia    PLAN:    In order of problems listed above:  Coronary artery disease: Secondary prevention stressed to the patient.  Importance of compliance with diet medication stressed and she vocalized understanding.  She ambulates to the best of her ability. Essential hypertension: Blood pressure is stable and diet was emphasized. Mixed dyslipidemia: Lipids very well-controlled.  She informed to be fine.  At goal. Mild aortic stenosis: Stable and symptoms mentioned and educated the patient.  We will do an echocardiogram in the next visit Patient will be seen in follow-up appointment in 6 months or earlier if the patient has any concerns.    Medication Adjustments/Labs and Tests Ordered: Current medicines are reviewed at length with the patient today.  Concerns regarding medicines are outlined above.  No orders of the defined types were placed in this encounter.  Meds ordered this encounter  Medications   atorvastatin  (LIPITOR) 40 MG tablet    Sig: Take 1 tablet (40 mg total) by mouth daily.    Dispense:  90 tablet    Refill:  3     No chief complaint on file.    History of Present Illness:    Brittany Santiago is a 89 y.o. female.  Patient has past medical history of coronary artery disease, essential hypertension, mixed dyslipidemia and mild aortic stenosis.  She denies any problems at this time and takes care of activities of daily living.  No chest pain orthopnea or PND.  At the time of my evaluation, the patient is alert awake oriented and in no distress.  She has neuropathies issues and ambulates  minimally.  Overall she is good she for a 89 year old.  Past Medical History:  Diagnosis Date   Anemia of chronic disease 05/15/2021   Closed right hip fracture (HCC) 05/12/2021   Coronary atherosclerosis of native coronary artery 03/16/2015   Dyslipidemia 03/10/2017   Endometrial cancer (HCC) 05/27/2017   Enterocele 12/30/2017   Failed orthopedic implant, initial encounter (HCC) 03/20/2021   Foot pain, right    GERD (gastroesophageal reflux disease)    History of hemiarthroplasty of right hip 01/08/2021   Hyperlipidemia    Hypertension    Hypothyroidism    Left bundle branch block (LBBB) 03/04/2017   Lumbar radiculopathy 08/23/2021   Malignant neoplasm of endometrium of corpus uteri (HCC) 05/27/2017   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) 03/20/2021   Menopausal symptom 05/29/2017   Mild aortic regurgitation 11/15/2019   Mild aortic stenosis 11/15/2019   Mild aortic valve regurgitation 11/15/2019   Mild aortic valve stenosis 11/15/2019   Neuropathy 04/29/2021   Right hip pain 05/12/2021   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   Seasonal allergies    UTI (urinary tract infection) 05/12/2021   Vaginal enterocele 12/30/2017   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    Past Surgical History:  Procedure Laterality Date   ABDOMINAL HYSTERECTOMY  2019   APPENDECTOMY     yaken with first c section    CESAREAN SECTION     x2  CONVERSION TO TOTAL HIP Right 03/20/2021   Procedure: Conversion of previous hemiarthroplasty  to right total hip arthroplasty;  Surgeon: Fidel Rogue, MD;  Location: [facility];  Service: Orthopedics;  Laterality: Right;   CORONARY ARTERY BYPASS GRAFT  02/2015   DOUBLE BYPASS ; DONE AT  [facility]     EYE SURGERY  1999   bilateral cataract extraction    JOINT REPLACEMENT Right    hip   KNEE ARTHROSCOPY Left    neck back surgery     ROBOTIC ASSISTED TOTAL HYSTERECTOMY WITH BILATERAL SALPINGO OOPHERECTOMY Bilateral 06/15/2017   Procedure: XI  ROBOTIC ASSISTED TOTAL HYSTERECTOMY WITH BILATERAL SALPINGO OOPHORECTOMY WITH SENTINAL LYMPH NODES, PELVIC LYMPHADENECTOMY, AND LYSIS OF ADHESIONS;  Surgeon: Eloy Herring, MD;  Location: [facility];  Service: Gynecology;  Laterality: Bilateral;   ROTATOR CUFF REPAIR Left    THYROID  SURGERY     THYROIDECTOMY  2000s   VAGINAL PROLAPSE REPAIR      Current Medications: Current Meds  Medication Sig   furosemide  (LASIX ) 20 MG tablet Take 20 mg by mouth daily.   levothyroxine  (SYNTHROID ) 75 MCG tablet Take 75 mcg by mouth daily.     Allergies:   Patient has no known allergies.   Social History   Socioeconomic History   Marital status: Widowed    Spouse name: Not on file   Number of children: Not on file   Years of education: Not on file   Highest education level: Not on file  Occupational History   Not on file  Tobacco Use   Smoking status: Never   Smokeless tobacco: Never  Vaping Use   Vaping status: Never Used  Substance and Sexual Activity   Alcohol  use: No   Drug use: No   Sexual activity: Not on file  Other Topics Concern   Not on file  Social History Narrative   Lives alone,retired.   Husband deceased   Social Drivers of Corporate investment banker Strain: Not on file  Food Insecurity: No Food Insecurity (05/29/2021)   Hunger Vital Sign    Worried About Running Out of Food in the Last Year: Never true    Ran Out of Food in the Last Year: Never true  Transportation Needs: No Transportation Needs (05/29/2021)   PRAPARE - Administrator, Civil Service (Medical): No    Lack of Transportation (Non-Medical): No  Physical Activity: Not on file  Stress: Not on file  Social Connections: Not on file     Family History: The patient's family history includes Depression in her father; Heart failure in her mother; Kidney cancer in her brother; Leukemia in her brother.  ROS:   Please see the history of present illness.    All other systems reviewed and are  negative.  EKGs/Labs/Other Studies Reviewed:    The following studies were reviewed today: I discussed my findings with the patient at length   Recent Labs: No results found for requested labs within last 365 days.  Recent Lipid Panel    Component Value Date/Time   CHOL 136 03/11/2018 0808   TRIG 157 (H) 03/11/2018 0808   HDL 36 (L) 03/11/2018 0808   CHOLHDL 3.8 03/11/2018 0808   LDLCALC 69 03/11/2018 0808    Physical Exam:    VS:  BP 134/70   Pulse 70   Ht 5' (1.524 m)   Wt 126 lb (57.2 kg)   SpO2 98%   BMI 24.61 kg/m  Wt Readings from Last 3 Encounters:  08/20/23 126 lb (57.2 kg)  08/25/22 129 lb (58.5 kg)  08/12/21 126 lb 12.8 oz (57.5 kg)     GEN: Patient is in no acute distress HEENT: Normal NECK: No JVD; No carotid bruits LYMPHATICS: No lymphadenopathy CARDIAC: Hear sounds regular, 2/6 systolic murmur at the apex and 2/6 systolic murmur at the aortic area. RESPIRATORY:  Clear to auscultation without rales, wheezing or rhonchi  ABDOMEN: Soft, non-tender, non-distended MUSCULOSKELETAL:  No edema; No deformity  SKIN: Warm and dry NEUROLOGIC:  Alert and oriented x 3 PSYCHIATRIC:  Normal affect   Signed, Jennifer JONELLE Crape, MD  08/20/2023 10:25 AM    [facility]

## 2023-10-01 DIAGNOSIS — H5213 Myopia, bilateral: Secondary | ICD-10-CM | POA: Diagnosis not present

## 2023-10-01 DIAGNOSIS — H524 Presbyopia: Secondary | ICD-10-CM | POA: Diagnosis not present

## 2023-10-01 DIAGNOSIS — Z961 Presence of intraocular lens: Secondary | ICD-10-CM | POA: Diagnosis not present

## 2023-10-01 DIAGNOSIS — H353132 Nonexudative age-related macular degeneration, bilateral, intermediate dry stage: Secondary | ICD-10-CM | POA: Diagnosis not present

## 2023-10-01 DIAGNOSIS — H04123 Dry eye syndrome of bilateral lacrimal glands: Secondary | ICD-10-CM | POA: Diagnosis not present

## 2023-10-12 DIAGNOSIS — M1712 Unilateral primary osteoarthritis, left knee: Secondary | ICD-10-CM | POA: Diagnosis not present

## 2023-11-02 DIAGNOSIS — B9689 Other specified bacterial agents as the cause of diseases classified elsewhere: Secondary | ICD-10-CM | POA: Diagnosis not present

## 2023-11-02 DIAGNOSIS — J208 Acute bronchitis due to other specified organisms: Secondary | ICD-10-CM | POA: Diagnosis not present

## 2023-12-08 DIAGNOSIS — Z1231 Encounter for screening mammogram for malignant neoplasm of breast: Secondary | ICD-10-CM | POA: Diagnosis not present

## 2024-01-11 DIAGNOSIS — Z6824 Body mass index (BMI) 24.0-24.9, adult: Secondary | ICD-10-CM | POA: Diagnosis not present

## 2024-01-11 DIAGNOSIS — I10 Essential (primary) hypertension: Secondary | ICD-10-CM | POA: Diagnosis not present

## 2024-01-11 DIAGNOSIS — E785 Hyperlipidemia, unspecified: Secondary | ICD-10-CM | POA: Diagnosis not present

## 2024-01-11 DIAGNOSIS — Z1331 Encounter for screening for depression: Secondary | ICD-10-CM | POA: Diagnosis not present

## 2024-01-11 DIAGNOSIS — E890 Postprocedural hypothyroidism: Secondary | ICD-10-CM | POA: Diagnosis not present

## 2024-01-11 DIAGNOSIS — E559 Vitamin D deficiency, unspecified: Secondary | ICD-10-CM | POA: Diagnosis not present

## 2024-01-11 DIAGNOSIS — Z23 Encounter for immunization: Secondary | ICD-10-CM | POA: Diagnosis not present

## 2024-01-11 DIAGNOSIS — D62 Acute posthemorrhagic anemia: Secondary | ICD-10-CM | POA: Diagnosis not present

## 2024-01-11 DIAGNOSIS — I2510 Atherosclerotic heart disease of native coronary artery without angina pectoris: Secondary | ICD-10-CM | POA: Diagnosis not present

## 2024-05-17 ENCOUNTER — Ambulatory Visit: Admitting: Cardiology
